# Patient Record
Sex: Female | Born: 1970 | Race: White | Hispanic: No | Smoking: Never smoker
Health system: Southern US, Community
[De-identification: ages and names within clinical notes are randomized; demographics above are authoritative.]

## PROBLEM LIST (undated history)

## (undated) ENCOUNTER — Inpatient Hospital Stay (HOSPITAL_COMMUNITY): Payer: Self-pay

## (undated) DIAGNOSIS — Z87440 Personal history of urinary (tract) infections: Secondary | ICD-10-CM

## (undated) DIAGNOSIS — F988 Other specified behavioral and emotional disorders with onset usually occurring in childhood and adolescence: Secondary | ICD-10-CM

## (undated) DIAGNOSIS — R51 Headache: Secondary | ICD-10-CM

## (undated) DIAGNOSIS — R5383 Other fatigue: Secondary | ICD-10-CM

## (undated) DIAGNOSIS — Z8719 Personal history of other diseases of the digestive system: Secondary | ICD-10-CM

## (undated) DIAGNOSIS — Z8489 Family history of other specified conditions: Secondary | ICD-10-CM

## (undated) DIAGNOSIS — N189 Chronic kidney disease, unspecified: Secondary | ICD-10-CM

## (undated) DIAGNOSIS — E039 Hypothyroidism, unspecified: Secondary | ICD-10-CM

## (undated) DIAGNOSIS — R5381 Other malaise: Secondary | ICD-10-CM

## (undated) DIAGNOSIS — C801 Malignant (primary) neoplasm, unspecified: Secondary | ICD-10-CM

## (undated) DIAGNOSIS — G43009 Migraine without aura, not intractable, without status migrainosus: Secondary | ICD-10-CM

## (undated) DIAGNOSIS — Z87442 Personal history of urinary calculi: Secondary | ICD-10-CM

## (undated) DIAGNOSIS — F329 Major depressive disorder, single episode, unspecified: Secondary | ICD-10-CM

## (undated) DIAGNOSIS — F411 Generalized anxiety disorder: Secondary | ICD-10-CM

## (undated) DIAGNOSIS — G4730 Sleep apnea, unspecified: Secondary | ICD-10-CM

## (undated) HISTORY — DX: Personal history of urinary (tract) infections: Z87.440

## (undated) HISTORY — DX: Other fatigue: R53.83

## (undated) HISTORY — DX: Hypothyroidism, unspecified: E03.9

## (undated) HISTORY — DX: Chronic kidney disease, unspecified: N18.9

## (undated) HISTORY — DX: Other malaise: R53.81

## (undated) HISTORY — DX: Other specified behavioral and emotional disorders with onset usually occurring in childhood and adolescence: F98.8

## (undated) HISTORY — DX: Migraine without aura, not intractable, without status migrainosus: G43.009

## (undated) HISTORY — DX: Major depressive disorder, single episode, unspecified: F32.9

## (undated) HISTORY — DX: Generalized anxiety disorder: F41.1

## (undated) HISTORY — DX: Malignant (primary) neoplasm, unspecified: C80.1

## (undated) HISTORY — DX: Sleep apnea, unspecified: G47.30

## (undated) HISTORY — PX: KIDNEY STONE SURGERY: SHX686

## (undated) HISTORY — DX: Personal history of other diseases of the digestive system: Z87.19

## (undated) HISTORY — DX: Personal history of urinary calculi: Z87.442

## (undated) HISTORY — DX: Headache: R51

---

## 1997-10-30 ENCOUNTER — Encounter (HOSPITAL_COMMUNITY): Admission: RE | Admit: 1997-10-30 | Discharge: 1998-01-28 | Payer: Self-pay | Admitting: *Deleted

## 1998-03-02 ENCOUNTER — Other Ambulatory Visit: Admission: RE | Admit: 1998-03-02 | Discharge: 1998-03-02 | Payer: Self-pay | Admitting: Obstetrics & Gynecology

## 1998-03-06 ENCOUNTER — Encounter (HOSPITAL_COMMUNITY): Admission: RE | Admit: 1998-03-06 | Discharge: 1998-06-04 | Payer: Self-pay | Admitting: *Deleted

## 1998-06-28 ENCOUNTER — Encounter (HOSPITAL_COMMUNITY): Admission: RE | Admit: 1998-06-28 | Discharge: 1998-09-26 | Payer: Self-pay | Admitting: *Deleted

## 1998-10-03 ENCOUNTER — Encounter (HOSPITAL_COMMUNITY): Admission: RE | Admit: 1998-10-03 | Discharge: 1999-01-01 | Payer: Self-pay | Admitting: *Deleted

## 1998-12-02 ENCOUNTER — Encounter (HOSPITAL_COMMUNITY): Admission: RE | Admit: 1998-12-02 | Discharge: 1999-03-02 | Payer: Self-pay | Admitting: *Deleted

## 1999-03-11 ENCOUNTER — Encounter (HOSPITAL_COMMUNITY): Admission: RE | Admit: 1999-03-11 | Discharge: 1999-06-09 | Payer: Self-pay | Admitting: *Deleted

## 1999-04-11 ENCOUNTER — Encounter: Admission: RE | Admit: 1999-04-11 | Discharge: 1999-04-11 | Payer: Self-pay | Admitting: Hematology and Oncology

## 2000-08-07 ENCOUNTER — Emergency Department (HOSPITAL_COMMUNITY): Admission: EM | Admit: 2000-08-07 | Discharge: 2000-08-07 | Payer: Self-pay

## 2003-01-05 ENCOUNTER — Other Ambulatory Visit: Admission: RE | Admit: 2003-01-05 | Discharge: 2003-01-05 | Payer: Self-pay | Admitting: Obstetrics and Gynecology

## 2005-10-31 ENCOUNTER — Other Ambulatory Visit: Admission: RE | Admit: 2005-10-31 | Discharge: 2005-10-31 | Payer: Self-pay | Admitting: Obstetrics and Gynecology

## 2007-06-03 ENCOUNTER — Emergency Department (HOSPITAL_COMMUNITY): Admission: EM | Admit: 2007-06-03 | Discharge: 2007-06-03 | Payer: Self-pay | Admitting: Emergency Medicine

## 2007-06-24 ENCOUNTER — Ambulatory Visit: Payer: Self-pay | Admitting: Family Medicine

## 2007-06-24 DIAGNOSIS — Z87440 Personal history of urinary (tract) infections: Secondary | ICD-10-CM | POA: Insufficient documentation

## 2007-06-24 DIAGNOSIS — Z87442 Personal history of urinary calculi: Secondary | ICD-10-CM | POA: Insufficient documentation

## 2007-06-24 DIAGNOSIS — G43009 Migraine without aura, not intractable, without status migrainosus: Secondary | ICD-10-CM | POA: Insufficient documentation

## 2007-06-24 DIAGNOSIS — F329 Major depressive disorder, single episode, unspecified: Secondary | ICD-10-CM

## 2007-06-24 DIAGNOSIS — F339 Major depressive disorder, recurrent, unspecified: Secondary | ICD-10-CM | POA: Insufficient documentation

## 2007-06-24 DIAGNOSIS — F411 Generalized anxiety disorder: Secondary | ICD-10-CM | POA: Insufficient documentation

## 2007-06-24 DIAGNOSIS — R5383 Other fatigue: Secondary | ICD-10-CM

## 2007-06-24 DIAGNOSIS — F3289 Other specified depressive episodes: Secondary | ICD-10-CM

## 2007-06-24 DIAGNOSIS — R5381 Other malaise: Secondary | ICD-10-CM

## 2007-06-24 DIAGNOSIS — Z8719 Personal history of other diseases of the digestive system: Secondary | ICD-10-CM | POA: Insufficient documentation

## 2007-06-24 HISTORY — DX: Personal history of urinary calculi: Z87.442

## 2007-06-24 HISTORY — DX: Other malaise: R53.81

## 2007-06-24 HISTORY — DX: Generalized anxiety disorder: F41.1

## 2007-06-24 HISTORY — DX: Personal history of other diseases of the digestive system: Z87.19

## 2007-06-24 HISTORY — DX: Other fatigue: R53.83

## 2007-06-24 HISTORY — DX: Major depressive disorder, single episode, unspecified: F32.9

## 2007-06-24 HISTORY — DX: Other specified depressive episodes: F32.89

## 2007-06-24 HISTORY — DX: Personal history of urinary (tract) infections: Z87.440

## 2007-06-24 HISTORY — DX: Migraine without aura, not intractable, without status migrainosus: G43.009

## 2007-06-25 ENCOUNTER — Encounter: Payer: Self-pay | Admitting: Family Medicine

## 2007-06-29 ENCOUNTER — Telehealth (INDEPENDENT_AMBULATORY_CARE_PROVIDER_SITE_OTHER): Payer: Self-pay | Admitting: *Deleted

## 2007-06-29 ENCOUNTER — Encounter (INDEPENDENT_AMBULATORY_CARE_PROVIDER_SITE_OTHER): Payer: Self-pay | Admitting: *Deleted

## 2007-06-29 LAB — CONVERTED CEMR LAB
ALT: 18 units/L (ref 0–35)
AST: 18 units/L (ref 0–37)
Albumin: 3.8 g/dL (ref 3.5–5.2)
Alkaline Phosphatase: 60 units/L (ref 39–117)
Basophils Absolute: 0.2 10*3/uL — ABNORMAL HIGH (ref 0.0–0.1)
Bilirubin, Direct: 0.1 mg/dL (ref 0.0–0.3)
Eosinophils Absolute: 0.1 10*3/uL (ref 0.0–0.6)
Eosinophils Relative: 1.5 % (ref 0.0–5.0)
GFR calc Af Amer: 81 mL/min
HCT: 38 % (ref 36.0–46.0)
MCHC: 34.4 g/dL (ref 30.0–36.0)
MCV: 81.3 fL (ref 78.0–100.0)
Neutro Abs: 5.4 10*3/uL (ref 1.4–7.7)
Potassium: 3.9 meq/L (ref 3.5–5.1)
RDW: 14.2 % (ref 11.5–14.6)
Sodium: 142 meq/L (ref 135–145)
Total Bilirubin: 1.1 mg/dL (ref 0.3–1.2)

## 2007-07-14 ENCOUNTER — Ambulatory Visit: Payer: Self-pay | Admitting: Family Medicine

## 2007-07-14 DIAGNOSIS — F988 Other specified behavioral and emotional disorders with onset usually occurring in childhood and adolescence: Secondary | ICD-10-CM | POA: Insufficient documentation

## 2007-07-14 DIAGNOSIS — E039 Hypothyroidism, unspecified: Secondary | ICD-10-CM

## 2007-07-14 HISTORY — DX: Other specified behavioral and emotional disorders with onset usually occurring in childhood and adolescence: F98.8

## 2007-07-14 HISTORY — DX: Hypothyroidism, unspecified: E03.9

## 2007-07-19 LAB — CONVERTED CEMR LAB: TSH: 7.99 microintl units/mL — ABNORMAL HIGH (ref 0.35–5.50)

## 2007-08-11 ENCOUNTER — Ambulatory Visit: Payer: Self-pay | Admitting: Family Medicine

## 2007-09-01 ENCOUNTER — Ambulatory Visit: Payer: Self-pay | Admitting: Family Medicine

## 2007-09-02 ENCOUNTER — Encounter (INDEPENDENT_AMBULATORY_CARE_PROVIDER_SITE_OTHER): Payer: Self-pay | Admitting: *Deleted

## 2007-09-02 LAB — CONVERTED CEMR LAB: TSH: 7.08 microintl units/mL — ABNORMAL HIGH (ref 0.35–5.50)

## 2007-09-10 ENCOUNTER — Telehealth (INDEPENDENT_AMBULATORY_CARE_PROVIDER_SITE_OTHER): Payer: Self-pay | Admitting: *Deleted

## 2007-10-08 ENCOUNTER — Ambulatory Visit: Payer: Self-pay | Admitting: Internal Medicine

## 2007-10-08 DIAGNOSIS — R51 Headache: Secondary | ICD-10-CM

## 2007-10-08 DIAGNOSIS — R519 Headache, unspecified: Secondary | ICD-10-CM | POA: Insufficient documentation

## 2007-10-08 HISTORY — DX: Headache: R51

## 2007-10-08 LAB — CONVERTED CEMR LAB
Cholesterol: 216 mg/dL (ref 0–200)
HDL: 37.8 mg/dL — ABNORMAL LOW (ref >39.0)
LDL Cholesterol: 104 mg/dL — ABNORMAL HIGH (ref 0–99)
TSH: 9.86 microintl units/mL — ABNORMAL HIGH (ref 0.35–5.50)
Total CHOL/HDL Ratio: 5.7
VLDL: 74 mg/dL — ABNORMAL HIGH (ref 0–40)

## 2007-10-20 ENCOUNTER — Encounter: Payer: Self-pay | Admitting: Internal Medicine

## 2007-12-14 ENCOUNTER — Encounter: Payer: Self-pay | Admitting: Internal Medicine

## 2008-01-17 ENCOUNTER — Telehealth: Payer: Self-pay | Admitting: Internal Medicine

## 2008-01-28 ENCOUNTER — Ambulatory Visit: Payer: Self-pay | Admitting: Internal Medicine

## 2008-05-16 ENCOUNTER — Telehealth: Payer: Self-pay | Admitting: Internal Medicine

## 2008-07-26 ENCOUNTER — Emergency Department (HOSPITAL_BASED_OUTPATIENT_CLINIC_OR_DEPARTMENT_OTHER): Admission: EM | Admit: 2008-07-26 | Discharge: 2008-07-26 | Payer: Self-pay | Admitting: Emergency Medicine

## 2008-07-31 ENCOUNTER — Ambulatory Visit: Payer: Self-pay | Admitting: Internal Medicine

## 2008-07-31 DIAGNOSIS — M25519 Pain in unspecified shoulder: Secondary | ICD-10-CM | POA: Insufficient documentation

## 2008-08-28 ENCOUNTER — Telehealth: Payer: Self-pay | Admitting: Internal Medicine

## 2008-11-07 ENCOUNTER — Telehealth: Payer: Self-pay | Admitting: Internal Medicine

## 2009-03-26 ENCOUNTER — Telehealth: Payer: Self-pay | Admitting: Internal Medicine

## 2009-06-27 ENCOUNTER — Telehealth: Payer: Self-pay | Admitting: Internal Medicine

## 2009-09-10 ENCOUNTER — Telehealth: Payer: Self-pay | Admitting: Internal Medicine

## 2010-02-11 ENCOUNTER — Ambulatory Visit: Payer: Self-pay | Admitting: Family Medicine

## 2010-02-11 DIAGNOSIS — N390 Urinary tract infection, site not specified: Secondary | ICD-10-CM | POA: Insufficient documentation

## 2010-02-11 LAB — CONVERTED CEMR LAB
Glucose, Urine, Semiquant: NEGATIVE
Protein, U semiquant: >=300
Specific Gravity, Urine: 1.025

## 2010-10-31 NOTE — Assessment & Plan Note (Signed)
Summary: FEVER, L SIDE PAIN, NAUSEA // RS   Vital Signs:  Patient profile:   40 year old female Weight:      287 pounds Temp:     99.2 degrees F oral BP sitting:   112 / 82  (left arm) Cuff size:   large  Vitals Entered By: Raechel Ache, RN (Feb 11, 2010 11:32 AM) CC: C/o L flank pain rad into leg, dysuria, fever 102 over weekend, nausea. Currently on menses.   History of Present Illness: Here for 3 days of fevers to 102 degrees, nausea (vomited once), urgency to urinate, burning on urination, and left flank pain. She is also on her menses now. She has passed 6 kidney stones in the past, but she does not think this reperesents a stone right now. Drinking plenty of water. On Motrin.   Allergies: 1)  ! Sulfa  Past History:  Past Medical History: Reviewed history from 10/08/2007 and no changes required. Anxiety Depression Nephrolithiasis, hx of UTI Headache Hypothyroidism  Past Surgical History: Reviewed history from 10/08/2007 and no changes required. gravida one, para one, aborta zero  Review of Systems  The patient denies anorexia, weight loss, weight gain, vision loss, decreased hearing, hoarseness, chest pain, syncope, dyspnea on exertion, peripheral edema, prolonged cough, headaches, hemoptysis, melena, hematochezia, severe indigestion/heartburn, hematuria, incontinence, genital sores, muscle weakness, suspicious skin lesions, transient blindness, difficulty walking, depression, unusual weight change, abnormal bleeding, enlarged lymph nodes, angioedema, breast masses, and testicular masses.    Physical Exam  General:  Well-developed,well-nourished,in no acute distress; alert,appropriate and cooperative throughout examination Abdomen:  Bowel sounds positive,abdomen soft and non-tender without masses, organomegaly or hernias noted.   Impression & Recommendations:  Problem # 1:  UTI (ICD-599.0)  Her updated medication list for this problem includes:    Cipro 500  Mg Tabs (Ciprofloxacin hcl) .Marland Kitchen..Marland Kitchen Two times a day  Orders: T-Culture, Urine ([id number]) UA Dipstick w/o Micro (manual) (09811)  Complete Medication List: 1)  Synthroid 75 Mcg Tabs (Levothyroxine sodium) .... Take one tablet daily 2)  Amphetamine-dextroamphetamine 30 Mg Xr24h-cap (Amphetamine-dextroamphetamine) .... One daily 3)  Amphetamine-dextroamphetamine 10 Mg Tabs (Amphetamine-dextroamphetamine) .... One daily in the afternoon, as needed 4)  Cipro 500 Mg Tabs (Ciprofloxacin hcl) .... Two times a day  Patient Instructions: 1)  call Dr. Brunilda Payor soon.  Prescriptions: CIPRO 500 MG TABS (CIPROFLOXACIN HCL) two times a day  #14 x 0   Entered and Authorized by:   Nelwyn Salisbury MD   Signed by:   Nelwyn Salisbury MD on 02/11/2010   Method used:   Electronically to        [facility] [facility] [phone]* (retail)       [address] [place], [place]  [address]       Ph: [phone] or [phone]       Fax: [phone]   RxID:   [id number]   Laboratory Results   Urine Tests    Routine Urinalysis   Color: orange Appearance: Hazy Glucose: negative   (Normal Range: Negative) Bilirubin: small   (Normal Range: Negative) Ketone: negative   (Normal Range: Negative) Spec. Gravity: 1.025   (Normal Range: 1.003-1.035) Blood: large   (Normal Range: Negative) pH: 6.0   (Normal Range: 5.0-8.0) Protein: >=300   (Normal Range: Negative) Urobilinogen: 0.2   (Normal Range: 0-1) Nitrite: positive   (Normal Range: Negative) Leukocyte Esterace: moderate   (Normal Range: Negative)

## 2011-05-21 ENCOUNTER — Other Ambulatory Visit (INDEPENDENT_AMBULATORY_CARE_PROVIDER_SITE_OTHER): Payer: Managed Care, Other (non HMO)

## 2011-05-21 DIAGNOSIS — Z0000 Encounter for general adult medical examination without abnormal findings: Secondary | ICD-10-CM

## 2011-05-21 LAB — POCT URINALYSIS DIPSTICK
Bilirubin, UA: NEGATIVE
Glucose, UA: NEGATIVE
Nitrite, UA: POSITIVE
Spec Grav, UA: 1.025
Urobilinogen, UA: 0.2

## 2011-05-21 LAB — HEPATIC FUNCTION PANEL
AST: 21 U/L (ref 0–37)
Bilirubin, Direct: 0 mg/dL (ref 0.0–0.3)

## 2011-05-21 LAB — BASIC METABOLIC PANEL
BUN: 21 mg/dL (ref 6–23)
Chloride: 108 mEq/L (ref 96–112)
Creatinine, Ser: 0.8 mg/dL (ref 0.4–1.2)
GFR: 79.9 mL/min (ref >60.00)
Potassium: 4.1 mEq/L (ref 3.5–5.1)
Sodium: 139 mEq/L (ref 135–145)

## 2011-05-21 LAB — CBC WITH DIFFERENTIAL/PLATELET
Basophils Relative: 0.7 % (ref 0.0–3.0)
Eosinophils Absolute: 0.1 10*3/uL (ref 0.0–0.7)
Eosinophils Relative: 1.6 % (ref 0.0–5.0)
HCT: 42.7 % (ref 36.0–46.0)
Hemoglobin: 14.2 g/dL (ref 12.0–15.0)
Lymphocytes Relative: 30 % (ref 12.0–46.0)
Lymphs Abs: 2.1 10*3/uL (ref 0.7–4.0)
MCHC: 33.3 g/dL (ref 30.0–36.0)
Monocytes Absolute: 0.4 10*3/uL (ref 0.1–1.0)
Monocytes Relative: 5.6 % (ref 3.0–12.0)
Neutro Abs: 4.3 10*3/uL (ref 1.4–7.7)
Neutrophils Relative %: 62.1 % (ref 43.0–77.0)
Platelets: 227 10*3/uL (ref 150.0–400.0)
RDW: 14.5 % (ref 11.5–14.6)

## 2011-05-21 LAB — LIPID PANEL
Total CHOL/HDL Ratio: 5
Triglycerides: 396 mg/dL — ABNORMAL HIGH (ref 0.0–149.0)
VLDL: 79.2 mg/dL — ABNORMAL HIGH (ref 0.0–40.0)

## 2011-05-21 LAB — LDL CHOLESTEROL, DIRECT: Direct LDL: 136.3 mg/dL

## 2011-05-22 ENCOUNTER — Telehealth: Payer: Self-pay

## 2011-05-22 MED ORDER — LEVOTHYROXINE SODIUM 75 MCG PO TABS: 75.0000 ug | ORAL_TABLET | Freq: Every day | ORAL | Status: DC

## 2011-05-22 NOTE — Telephone Encounter (Signed)
Called pt per dr. Vernon Prey request - TSH elevated - she has been out og med x57mos. [facility] ,get started and will re-test at 9/11 cpx. KIK

## 2011-06-09 ENCOUNTER — Encounter: Payer: Self-pay | Admitting: Internal Medicine

## 2011-06-10 ENCOUNTER — Ambulatory Visit (INDEPENDENT_AMBULATORY_CARE_PROVIDER_SITE_OTHER): Payer: Managed Care, Other (non HMO) | Admitting: Internal Medicine

## 2011-06-10 ENCOUNTER — Encounter: Payer: Self-pay | Admitting: Internal Medicine

## 2011-06-10 DIAGNOSIS — F3289 Other specified depressive episodes: Secondary | ICD-10-CM

## 2011-06-10 DIAGNOSIS — E039 Hypothyroidism, unspecified: Secondary | ICD-10-CM

## 2011-06-10 DIAGNOSIS — Z87442 Personal history of urinary calculi: Secondary | ICD-10-CM

## 2011-06-10 DIAGNOSIS — F988 Other specified behavioral and emotional disorders with onset usually occurring in childhood and adolescence: Secondary | ICD-10-CM

## 2011-06-10 DIAGNOSIS — F329 Major depressive disorder, single episode, unspecified: Secondary | ICD-10-CM

## 2011-06-10 MED ORDER — AMPHETAMINE-DEXTROAMPHETAMINE 10 MG PO TABS: 10.0000 mg | ORAL_TABLET | Freq: Every day | ORAL | Status: DC | PRN

## 2011-06-10 MED ORDER — AMPHETAMINE-DEXTROAMPHETAMINE 20 MG PO TABS: 20.0000 mg | ORAL_TABLET | Freq: Every day | ORAL | Status: DC

## 2011-06-10 MED ORDER — LEVOTHYROXINE SODIUM 100 MCG PO TABS: 100.0000 ug | ORAL_TABLET | Freq: Every day | ORAL | Status: DC

## 2011-06-10 NOTE — Progress Notes (Signed)
  Subjective:    Patient ID: Madison Tapia, female    DOB: 30-May-1971, 40 y.o.   MRN: [id number]  HPI  40 year old patient who is in today for a health maintenance examination. She has hypothyroidism but has not taken her medication in a number of months laboratory studies were reviewed and TSH is elevated. She has ADD but has not taken Adderall in some time. She feels as she did not tolerate well the extended release form. She has nephrolithiasis and did have some recent symptomatic renal colic. She has exogenous obesity other medical problems include anxiety depression history of migraine headaches. She has a history of IBS.    Review of Systems  Constitutional: Positive for fatigue and unexpected weight change. Negative for fever and appetite change.  HENT: Negative for hearing loss, ear pain, nosebleeds, congestion, sore throat, mouth sores, trouble swallowing, neck stiffness, dental problem, voice change, sinus pressure and tinnitus.   Eyes: Negative for photophobia, pain, redness and visual disturbance.  Respiratory: Negative for cough, chest tightness and shortness of breath.   Cardiovascular: Negative for chest pain, palpitations and leg swelling.  Gastrointestinal: Negative for nausea, vomiting, abdominal pain, diarrhea, constipation, blood in stool, abdominal distention and rectal pain.  Genitourinary: Negative for dysuria, urgency, frequency, hematuria, flank pain, vaginal bleeding, vaginal discharge, difficulty urinating, genital sores, vaginal pain, menstrual problem and pelvic pain.  Musculoskeletal: Negative for back pain and arthralgias.  Skin: Negative for rash.  Neurological: Positive for weakness. Negative for dizziness, syncope, speech difficulty, light-headedness, numbness and headaches.  Hematological: Negative for adenopathy. Does not bruise/bleed easily.  Psychiatric/Behavioral: Negative for suicidal ideas, behavioral problems, self-injury, dysphoric mood and  agitation. The patient is not nervous/anxious.        Objective:   Physical Exam  Constitutional: She is oriented to person, place, and time. She appears well-developed and well-nourished.  HENT:  Head: Normocephalic and atraumatic.  Right Ear: External ear normal.  Left Ear: External ear normal.  Mouth/Throat: Oropharynx is clear and moist.  Eyes: Conjunctivae and EOM are normal.  Neck: Normal range of motion. Neck supple. No JVD present. No thyromegaly present.  Cardiovascular: Normal rate, regular rhythm, normal heart sounds and intact distal pulses.   No murmur heard. Pulmonary/Chest: Effort normal and breath sounds normal. She has no wheezes. She has no rales.  Abdominal: Soft. Bowel sounds are normal. She exhibits no distension and no mass. There is no tenderness. There is no rebound and no guarding.  Genitourinary:       Deferred due to active menses  Musculoskeletal: Normal range of motion. She exhibits no edema and no tenderness.  Neurological: She is alert and oriented to person, place, and time. She has normal reflexes. No cranial nerve deficit. She exhibits normal muscle tone. Coordination normal.       Reflexes appear to be normal  Skin: Skin is warm and dry. No rash noted.       Skin slightly dry  Psychiatric: She has a normal mood and affect. Her behavior is normal.          Assessment & Plan:   Preventive health Morbid obesity Hypothyroidism ADD Mild dyslipidemia aggravated by hypothyroidism  The patient will be resumed on her medications she has had to return in 6 weeks for a followup TSH. We'll consider a Pap at that time

## 2011-06-10 NOTE — Patient Instructions (Addendum)
Limit your sodium (Salt) intake    It is important that you exercise regularly, at least 20 minutes 3 to 4 times per week.  If you develop chest pain or shortness of breath seek  medical attention.  Followup thyroid test in 6-8 weeks   Schedule your mammogram.

## 2011-07-11 LAB — URINE MICROSCOPIC-ADD ON

## 2011-07-11 LAB — URINE CULTURE

## 2011-07-11 LAB — URINALYSIS, ROUTINE W REFLEX MICROSCOPIC
Bilirubin Urine: NEGATIVE
Nitrite: NEGATIVE
Protein, ur: NEGATIVE
Specific Gravity, Urine: 1.013

## 2011-07-11 LAB — CBC
HCT: 34.7 — ABNORMAL LOW
Hemoglobin: 12.1
MCHC: 34.9
MCV: 81.2
Platelets: 295
RBC: 4.28
RDW: 13.3

## 2011-07-11 LAB — COMPREHENSIVE METABOLIC PANEL
AST: 22
BUN: 12
CO2: 25
Chloride: 103
GFR calc non Af Amer: 47 — ABNORMAL LOW
Potassium: 4.2
Sodium: 136

## 2011-07-11 LAB — POCT PREGNANCY, URINE
Operator id: 197071
Preg Test, Ur: NEGATIVE

## 2011-07-11 LAB — DIFFERENTIAL
Lymphocytes Relative: 15
Lymphs Abs: 2
Monocytes Absolute: 0.9 — ABNORMAL HIGH

## 2011-07-22 ENCOUNTER — Ambulatory Visit: Payer: Managed Care, Other (non HMO) | Admitting: Internal Medicine

## 2011-09-15 ENCOUNTER — Other Ambulatory Visit: Payer: Self-pay | Admitting: Internal Medicine

## 2011-09-15 MED ORDER — AMPHETAMINE-DEXTROAMPHETAMINE 20 MG PO TABS: 20.0000 mg | ORAL_TABLET | Freq: Every day | ORAL | Status: DC

## 2011-09-15 MED ORDER — AMPHETAMINE-DEXTROAMPHETAMINE 10 MG PO TABS: 10.0000 mg | ORAL_TABLET | Freq: Every day | ORAL | Status: DC | PRN

## 2011-09-15 NOTE — Telephone Encounter (Signed)
Ans mach - LMTCB if questions - rx's ready to pick up

## 2011-09-15 NOTE — Telephone Encounter (Signed)
Refill Adderall. Thanks. °

## 2012-01-26 ENCOUNTER — Other Ambulatory Visit: Payer: Self-pay | Admitting: Internal Medicine

## 2012-01-26 MED ORDER — AMPHETAMINE-DEXTROAMPHETAMINE 10 MG PO TABS: 10.0000 mg | ORAL_TABLET | Freq: Every day | ORAL | Status: DC | PRN

## 2012-01-26 MED ORDER — AMPHETAMINE-DEXTROAMPHETAMINE 20 MG PO TABS: 20.0000 mg | ORAL_TABLET | Freq: Every day | ORAL | Status: DC

## 2012-01-26 NOTE — Telephone Encounter (Signed)
Pt called req scripts for Adderall 10 mg and Adderall 20 mg

## 2012-01-26 NOTE — Telephone Encounter (Signed)
Pt aware rx's ready for pick up  

## 2012-06-10 ENCOUNTER — Telehealth: Payer: Self-pay | Admitting: Internal Medicine

## 2012-06-10 MED ORDER — AMPHETAMINE-DEXTROAMPHETAMINE 20 MG PO TABS: 20.0000 mg | ORAL_TABLET | Freq: Every day | ORAL | Status: DC

## 2012-06-10 MED ORDER — AMPHETAMINE-DEXTROAMPHETAMINE 10 MG PO TABS: 10.0000 mg | ORAL_TABLET | Freq: Every day | ORAL | Status: DC | PRN

## 2012-06-10 NOTE — Telephone Encounter (Signed)
attmept to call - Vm - LMTCB if questions - rx's ready for pick up

## 2012-06-10 NOTE — Telephone Encounter (Signed)
ok 

## 2012-06-10 NOTE — Telephone Encounter (Signed)
Patient called stating that she need a refill of her adderall. Please assist. Patient would like to have her 20mg  and her 10mg  printed on different sheets as she does not fill them at the same time as she only takes the 10mg  prn. Please assist.

## 2012-06-10 NOTE — Telephone Encounter (Signed)
Please advise - just seen cpx - yesterday

## 2012-07-06 ENCOUNTER — Other Ambulatory Visit (INDEPENDENT_AMBULATORY_CARE_PROVIDER_SITE_OTHER): Payer: Managed Care, Other (non HMO)

## 2012-07-06 ENCOUNTER — Other Ambulatory Visit: Payer: Self-pay | Admitting: Internal Medicine

## 2012-07-06 DIAGNOSIS — Z0000 Encounter for general adult medical examination without abnormal findings: Secondary | ICD-10-CM

## 2012-07-06 LAB — BASIC METABOLIC PANEL
BUN: 12 mg/dL (ref 6–23)
CO2: 22 mEq/L (ref 19–32)
Chloride: 106 mEq/L (ref 96–112)
Creatinine, Ser: 0.9 mg/dL (ref 0.4–1.2)
Potassium: 4.2 mEq/L (ref 3.5–5.1)

## 2012-07-06 LAB — POCT URINALYSIS DIPSTICK
Bilirubin, UA: NEGATIVE
Glucose, UA: NEGATIVE
Nitrite, UA: POSITIVE
Urobilinogen, UA: 0.2
pH, UA: 5.5

## 2012-07-06 LAB — CBC WITH DIFFERENTIAL/PLATELET
Basophils Absolute: 0.1 10*3/uL (ref 0.0–0.1)
Eosinophils Absolute: 0.1 10*3/uL (ref 0.0–0.7)
HCT: 40.7 % (ref 36.0–46.0)
Lymphs Abs: 2.3 10*3/uL (ref 0.7–4.0)
MCHC: 33.1 g/dL (ref 30.0–36.0)
MCV: 85 fl (ref 78.0–100.0)
Monocytes Absolute: 0.5 10*3/uL (ref 0.1–1.0)
Neutrophils Relative %: 64.6 % (ref 43.0–77.0)
Platelets: 243 10*3/uL (ref 150.0–400.0)
RDW: 14.7 % — ABNORMAL HIGH (ref 11.5–14.6)

## 2012-07-06 LAB — HEPATIC FUNCTION PANEL
Albumin: 3.6 g/dL (ref 3.5–5.2)
Alkaline Phosphatase: 56 U/L (ref 39–117)
Bilirubin, Direct: 0.1 mg/dL (ref 0.0–0.3)
Total Protein: 7.3 g/dL (ref 6.0–8.3)

## 2012-07-06 LAB — LIPID PANEL
Total CHOL/HDL Ratio: 4
VLDL: 37.8 mg/dL (ref 0.0–40.0)

## 2012-07-06 LAB — LDL CHOLESTEROL, DIRECT: Direct LDL: 147 mg/dL

## 2012-07-06 MED ORDER — LEVOTHYROXINE SODIUM 100 MCG PO TABS: 100.0000 ug | ORAL_TABLET | Freq: Every day | ORAL | Status: DC

## 2012-07-06 NOTE — Telephone Encounter (Signed)
We have not recv'd any fax or escribe request for refills - will sent 1 mos Rf to [facility] pending labs drawn today - there may be changes if she has not take in 1 over 1 mos

## 2012-07-06 NOTE — Telephone Encounter (Signed)
Patient came in stating that she has been out of her levothyroxine over a month and she has an appt scheduled for next week and would like to have a refill call into [facility] on [address]. Please assist.

## 2012-07-12 ENCOUNTER — Ambulatory Visit (INDEPENDENT_AMBULATORY_CARE_PROVIDER_SITE_OTHER): Payer: Managed Care, Other (non HMO) | Admitting: Internal Medicine

## 2012-07-12 ENCOUNTER — Other Ambulatory Visit (HOSPITAL_COMMUNITY)
Admission: RE | Admit: 2012-07-12 | Discharge: 2012-07-12 | Disposition: A | Discharge status: Home / Self Care | Payer: Managed Care, Other (non HMO) | Source: Ambulatory Visit | Attending: Internal Medicine | Admitting: Internal Medicine

## 2012-07-12 ENCOUNTER — Encounter: Payer: Self-pay | Admitting: Internal Medicine

## 2012-07-12 VITALS — BP 100/80 | HR 72 | Temp 98.0°F | Resp 18 | Ht 66.0 in | Wt 277.0 lb

## 2012-07-12 DIAGNOSIS — Z0000 Encounter for general adult medical examination without abnormal findings: Secondary | ICD-10-CM

## 2012-07-12 DIAGNOSIS — E039 Hypothyroidism, unspecified: Secondary | ICD-10-CM

## 2012-07-12 DIAGNOSIS — Z87442 Personal history of urinary calculi: Secondary | ICD-10-CM

## 2012-07-12 DIAGNOSIS — Z01419 Encounter for gynecological examination (general) (routine) without abnormal findings: Secondary | ICD-10-CM | POA: Insufficient documentation

## 2012-07-12 MED ORDER — AMPHETAMINE-DEXTROAMPHETAMINE 20 MG PO TABS: 20.0000 mg | ORAL_TABLET | Freq: Every day | ORAL | Status: DC

## 2012-07-12 MED ORDER — AMPHETAMINE-DEXTROAMPHETAMINE 10 MG PO TABS: 10.0000 mg | ORAL_TABLET | Freq: Every day | ORAL | Status: DC | PRN

## 2012-07-12 MED ORDER — LEVOTHYROXINE SODIUM 125 MCG PO TABS: 125.0000 ug | ORAL_TABLET | Freq: Every day | ORAL | Status: DC

## 2012-07-12 NOTE — Progress Notes (Signed)
Subjective:    Patient ID: Madison Tapia, female    DOB: Jun 03, 1971, 41 y.o.   MRN: [id number]  HPI  Wt Readings from Last 3 Encounters:  07/12/12 277 lb (125.646 kg)  06/10/11 298 lb (135.172 kg)  02/11/10 287 lb (130.182 kg)    Review of Systems  Constitutional: Negative for fever, appetite change, fatigue and unexpected weight change.  HENT: Negative for hearing loss, ear pain, nosebleeds, congestion, sore throat, mouth sores, trouble swallowing, neck stiffness, dental problem, voice change, sinus pressure and tinnitus.   Eyes: Negative for photophobia, pain, redness and visual disturbance.  Respiratory: Negative for cough, chest tightness and shortness of breath.   Cardiovascular: Negative for chest pain, palpitations and leg swelling.  Gastrointestinal: Negative for nausea, vomiting, abdominal pain, diarrhea, constipation, blood in stool, abdominal distention and rectal pain.  Genitourinary: Negative for dysuria, urgency, frequency, hematuria, flank pain, vaginal bleeding, vaginal discharge, difficulty urinating, genital sores, vaginal pain, menstrual problem and pelvic pain.  Musculoskeletal: Negative for back pain and arthralgias.  Skin: Negative for rash.  Neurological: Negative for dizziness, syncope, speech difficulty, weakness, light-headedness, numbness and headaches.  Hematological: Negative for adenopathy. Does not bruise/bleed easily.  Psychiatric/Behavioral: Negative for suicidal ideas, behavioral problems, self-injury, dysphoric mood and agitation. The patient is not nervous/anxious.        Objective:   Physical Exam  Constitutional: She is oriented to person, place, and time. She appears well-developed and well-nourished.       Weight 277 Blood pressure well controlled  HENT:  Head: Normocephalic and atraumatic.  Right Ear: External ear normal.  Left Ear: External ear normal.  Mouth/Throat: Oropharynx is clear and moist.  Eyes: Conjunctivae normal and  EOM are normal.  Neck: Normal range of motion. Neck supple. No JVD present. No thyromegaly present.  Cardiovascular: Normal rate, regular rhythm, normal heart sounds and intact distal pulses.   No murmur heard. Pulmonary/Chest: Effort normal and breath sounds normal. She has no wheezes. She has no rales.  Abdominal: Soft. Bowel sounds are normal. She exhibits no distension and no mass. There is no tenderness. There is no rebound and no guarding.  Genitourinary: Vagina normal and uterus normal. No vaginal discharge found.  Musculoskeletal: Normal range of motion. She exhibits no edema and no tenderness.  Neurological: She is alert and oriented to person, place, and time. She has normal reflexes. No cranial nerve deficit. She exhibits normal muscle tone. Coordination normal.  Skin: Skin is warm and dry. No rash noted.  Psychiatric: She has a normal mood and affect. Her behavior is normal.          Assessment & Plan:   Subjective:    Patient ID: Madison Tapia, female    DOB: 03-30-1971, 41 y.o.   MRN: [id number]  HPI  41 year old patient who is in today for a health maintenance examination. She has hypothyroidism but has not taken her medication several days prior to laboratory studies; TSH is elevated. She has ADD but has not taken Adderall in some time. She feels as she did not tolerate well the extended release form. She has nephrolithiasis and did have some recent symptomatic renal colic. She has exogenous obesity other medical problems include anxiety depression history of migraine headaches. She has a history of IBS. Over the past year her  weight is down approximately 22 pounds  The patient is a gravida 1 para 1 abortus 61 with a 37 year old son Family history father is 33 history of diabetes hypertension hypercholesterolemia. Mother  history of hypertension and dyslipidemia also history of breast cancer 2 younger sisters are well  Past Medical History  Diagnosis Date  . ADD  07/14/2007  . ANXIETY 06/24/2007  . COMMON MIGRAINE 06/24/2007  . DEPRESSION 06/24/2007  . Headache 10/08/2007  . HX, URINARY INFECTION 06/24/2007  . IRRITABLE BOWEL SYNDROME, HX OF 06/24/2007  . MALAISE AND FATIGUE 06/24/2007  . 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 Unspecified hypothyroidism 07/14/2007    History   Social History  . Marital Status: Married    Spouse Name: N/A    Number of Children: N/A  . Years of Education: N/A   Occupational History  . Not on file.   Social History Main Topics  . Smoking status: Never Smoker   . Smokeless tobacco: Never Used  . Alcohol Use: Yes  . Drug Use: No  . Sexually Active: Not on file   Other Topics Concern  . Not on file   Social History Narrative  . No narrative on file    No past surgical history on file.  Family History  Problem Relation Age of Onset  . Cancer Mother   . Thyroid disease Mother   . Diabetes Father   . Hypertension Father   . Mental illness Father   . Depression Neg Hx     family hx    Allergies  Allergen Reactions  . Sulfonamide Derivatives     Current Outpatient Prescriptions on File Prior to Visit  Medication Sig Dispense Refill  . DISCONTD: amphetamine-dextroamphetamine (ADDERALL) 10 MG tablet Take 1 tablet (10 mg total) by mouth daily as needed. In the afternoon if needed  30 tablet  0  . DISCONTD: amphetamine-dextroamphetamine (ADDERALL) 20 MG tablet Take 1 tablet (20 mg total) by mouth daily.  30 tablet  0    BP 100/80  Pulse 72  Temp 98 F (36.7 C) (Oral)  Resp 18  Ht 5\' 6"  (1.676 m)  Wt 277 lb (125.646 kg)  BMI 44.71 kg/m2  SpO2 96%        Review of Systems  Constitutional: Positive for fatigue and unexpected weight change. Negative for fever and appetite change.  HENT: Negative for hearing loss, ear pain, nosebleeds, congestion, sore throat, mouth sores, trouble swallowing, neck stiffness, dental problem, voice change, sinus pressure and tinnitus.   Eyes: Negative for  photophobia, pain, redness and visual disturbance.  Respiratory: Negative for cough, chest tightness and shortness of breath.   Cardiovascular: Negative for chest pain, palpitations and leg swelling.  Gastrointestinal: Negative for nausea, vomiting, abdominal pain, diarrhea, constipation, blood in stool, abdominal distention and rectal pain.  Genitourinary: Negative for dysuria, urgency, frequency, hematuria, flank pain, vaginal bleeding, vaginal discharge, difficulty urinating, genital sores, vaginal pain, menstrual problem and pelvic pain.  Musculoskeletal: Negative for back pain and arthralgias.  Skin: Negative for rash.  Neurological: Positive for weakness. Negative for dizziness, syncope, speech difficulty, light-headedness, numbness and headaches.  Hematological: Negative for adenopathy. Does not bruise/bleed easily.  Psychiatric/Behavioral: Negative for suicidal ideas, behavioral problems, self-injury, dysphoric mood and agitation. The patient is not nervous/anxious.        Objective:   Physical Exam  Constitutional: She is oriented to person, place, and time. She appears well-developed and well-nourished.  HENT:  Head: Normocephalic and atraumatic.  Right Ear: External ear normal.  Left Ear: External ear normal.  Mouth/Throat: Oropharynx is clear and moist.  Eyes: Conjunctivae and EOM are normal.  Neck: Normal range of motion. Neck supple. No JVD present. No  thyromegaly present.  Cardiovascular: Normal rate, regular rhythm, normal heart sounds and intact distal pulses.   No murmur heard. Pulmonary/Chest: Effort normal and breath sounds normal. She has no wheezes. She has no rales.  Abdominal: Soft. Bowel sounds are normal. She exhibits no distension and no mass. There is no tenderness. There is no rebound and no guarding.  Genitourinary:       Deferred due to active menses  Musculoskeletal: Normal range of motion. She exhibits no edema and no tenderness.  Neurological: She is  alert and oriented to person, place, and time. She has normal reflexes. No cranial nerve deficit. She exhibits normal muscle tone. Coordination normal.       Reflexes appear to be normal  Skin: Skin is warm and dry. No rash noted.       Skin slightly dry  Psychiatric: She has a normal mood and affect. Her behavior is normal.          Assessment & Plan:   Preventive health Morbid obesity Hypothyroidism ADD   The patient will be resumed on her medications she has had to return in 6 weeks for a followup TSH.  Exercise and weight loss encouraged We'll increase levothyroxine to 125 mcg daily

## 2012-07-12 NOTE — Patient Instructions (Addendum)
You need to lose weight.  Consider a lower calorie diet and regular exercise.    It is important that you exercise regularly, at least 20 minutes 3 to 4 times per week.  If you develop chest pain or shortness of breath seek  medical attention.  Return in one year for follow-up  

## 2012-07-20 NOTE — Progress Notes (Signed)
Quick Note:  Attempt to call- VM - LMTCb if questions - lab normal ______

## 2012-12-06 ENCOUNTER — Ambulatory Visit (INDEPENDENT_AMBULATORY_CARE_PROVIDER_SITE_OTHER): Payer: Managed Care, Other (non HMO) | Admitting: Internal Medicine

## 2012-12-06 ENCOUNTER — Encounter: Payer: Self-pay | Admitting: Internal Medicine

## 2012-12-06 VITALS — BP 118/80 | HR 106 | Temp 98.5°F | Resp 20 | Wt 278.0 lb

## 2012-12-06 DIAGNOSIS — F988 Other specified behavioral and emotional disorders with onset usually occurring in childhood and adolescence: Secondary | ICD-10-CM

## 2012-12-06 DIAGNOSIS — J069 Acute upper respiratory infection, unspecified: Secondary | ICD-10-CM

## 2012-12-06 MED ORDER — HYDROCODONE-HOMATROPINE 5-1.5 MG/5ML PO SYRP: 5.0000 mL | ORAL_SOLUTION | Freq: Four times a day (QID) | ORAL | Status: AC | PRN

## 2012-12-06 MED ORDER — NEOMYCIN-POLYMYXIN-HC 3.5-10000-1 OP SUSP: 2.0000 [drp] | Freq: Four times a day (QID) | OPHTHALMIC | Status: DC

## 2012-12-06 NOTE — Progress Notes (Signed)
Subjective:    Patient ID: Madison Tapia, female    DOB: 11-13-1970, 42 y.o.   MRN: [id number]  HPI  42 year old patient who has a history of ADHD. She has treated hypothyroidism. She's had a two-week not febrile illness with sore throat chest congestion and cough. She generally feels unwell. She was seen at the urgent care 7 days ago and was placed on amoxicillin which he took only briefly. She states that she felt it made her feel worse. She also has noticed some conjunctival injection with some dry matted exudate noted in the morning. She does have a sulfa allergy. No fever. She has been using ibuprofen with benefit.  Past Medical History  Diagnosis Date  . ADD 07/14/2007  . ANXIETY 06/24/2007  . COMMON MIGRAINE 06/24/2007  . DEPRESSION 06/24/2007  . Headache 10/08/2007  . HX, URINARY INFECTION 06/24/2007  . IRRITABLE BOWEL SYNDROME, HX OF 06/24/2007  . MALAISE AND FATIGUE 06/24/2007  . 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 Unspecified hypothyroidism 07/14/2007    History   Social History  . Marital Status: Married    Spouse Name: N/A    Number of Children: N/A  . Years of Education: N/A   Occupational History  . Not on file.   Social History Main Topics  . Smoking status: Never Smoker   . Smokeless tobacco: Never Used  . Alcohol Use: Yes  . Drug Use: No  . Sexually Active: Not on file   Other Topics Concern  . Not on file   Social History Narrative  . No narrative on file    History reviewed. No pertinent past surgical history.  Family History  Problem Relation Age of Onset  . Cancer Mother   . Thyroid disease Mother   . Diabetes Father   . Hypertension Father   . Mental illness Father   . Depression Neg Hx     family hx    Allergies  Allergen Reactions  . Sulfonamide Derivatives     Current Outpatient Prescriptions on File Prior to Visit  Medication Sig Dispense Refill  . amphetamine-dextroamphetamine (ADDERALL) 10 MG tablet Take 1 tablet (10 mg  total) by mouth daily as needed. In the afternoon if needed  30 tablet  0  . amphetamine-dextroamphetamine (ADDERALL) 20 MG tablet Take 1 tablet (20 mg total) by mouth daily.  30 tablet  0  . levothyroxine (SYNTHROID, LEVOTHROID) 125 MCG tablet Take 1 tablet (125 mcg total) by mouth daily.  90 tablet  3   No current facility-administered medications on file prior to visit.    BP 118/80  Pulse 106  Temp(Src) 98.5 F (36.9 C) (Oral)  Resp 20  Wt 278 lb (126.1 kg)  BMI 44.89 kg/m2  SpO2 97%  LMP 11/22/2012       Review of Systems  Constitutional: Positive for activity change and fatigue.  HENT: Positive for congestion, sore throat and sinus pressure. Negative for hearing loss, rhinorrhea, dental problem and tinnitus.   Eyes: Positive for redness. Negative for pain, discharge and visual disturbance.  Respiratory: Positive for cough. Negative for shortness of breath.   Cardiovascular: Negative for chest pain, palpitations and leg swelling.  Gastrointestinal: Negative for nausea, vomiting, abdominal pain, diarrhea, constipation, blood in stool and abdominal distention.  Genitourinary: Negative for dysuria, urgency, frequency, hematuria, flank pain, vaginal bleeding, vaginal discharge, difficulty urinating, vaginal pain and pelvic pain.  Musculoskeletal: Negative for joint swelling, arthralgias and gait problem.  Skin: Negative for rash.  Neurological: Positive for  weakness. Negative for dizziness, syncope, speech difficulty, numbness and headaches.  Hematological: Negative for adenopathy.  Psychiatric/Behavioral: Negative for behavioral problems, dysphoric mood and agitation. The patient is not nervous/anxious.        Objective:   Physical Exam  Constitutional: She is oriented to person, place, and time. She appears well-developed and well-nourished.  HENT:  Head: Normocephalic.  Right Ear: External ear normal.  Left Ear: External ear normal.  Oropharynx erythematous  Eyes:  EOM are normal. Pupils are equal, round, and reactive to light.  Conjunctival injection  Neck: Normal range of motion. Neck supple. No thyromegaly present.  Cardiovascular: Normal rate, regular rhythm, normal heart sounds and intact distal pulses.   Pulmonary/Chest: Effort normal and breath sounds normal. No respiratory distress. She has no wheezes. She has no rales.  Abdominal: Soft. Bowel sounds are normal. She exhibits no mass. There is no tenderness.  Musculoskeletal: Normal range of motion.  Lymphadenopathy:    She has no cervical adenopathy.  Neurological: She is alert and oriented to person, place, and time.  Skin: Skin is warm and dry. No rash noted.  Psychiatric: She has a normal mood and affect. Her behavior is normal.          Assessment & Plan:   Viral URI with cough Conjunctivitis ADHD  We'll treat symptomatically

## 2012-12-06 NOTE — Patient Instructions (Signed)
Use saline irrigation, warm  moist compresses and over-the-counter decongestants only as directed.  Call if there is no improvement in 5 to 7 days, or sooner if you develop increasing pain, fever, or any new symptoms. 

## 2012-12-23 ENCOUNTER — Telehealth: Payer: Self-pay | Admitting: Internal Medicine

## 2012-12-23 MED ORDER — AMPHETAMINE-DEXTROAMPHETAMINE 20 MG PO TABS: 20.0000 mg | ORAL_TABLET | Freq: Every day | ORAL | Status: DC

## 2012-12-23 MED ORDER — AMPHETAMINE-DEXTROAMPHETAMINE 10 MG PO TABS: 10.0000 mg | ORAL_TABLET | Freq: Every day | ORAL | Status: DC | PRN

## 2012-12-23 NOTE — Telephone Encounter (Signed)
Pt needs new rxs generic adderall 10 mg and 20 mg

## 2012-12-23 NOTE — Telephone Encounter (Signed)
Spoke to pt is taking Adderall 10 mg gets three Rx's of each at a time. Told her okay Rx's will be ready for pick up tomorrow. Pt verbalized understanding. Rx's printed and signed by [facility] and put at front desk for pick up.

## 2012-12-23 NOTE — Telephone Encounter (Signed)
Left message on voicemail to call office need to know if taking Adderall 10 mg all the time?

## 2013-01-10 ENCOUNTER — Encounter: Payer: Self-pay | Admitting: Family Medicine

## 2013-01-10 ENCOUNTER — Ambulatory Visit (INDEPENDENT_AMBULATORY_CARE_PROVIDER_SITE_OTHER): Payer: Managed Care, Other (non HMO) | Admitting: Family Medicine

## 2013-01-10 VITALS — BP 120/90 | Temp 99.2°F | Wt 271.0 lb

## 2013-01-10 DIAGNOSIS — R197 Diarrhea, unspecified: Secondary | ICD-10-CM

## 2013-01-10 NOTE — Patient Instructions (Addendum)
Diarrhea Infections caused by germs (bacterial) or a virus commonly cause diarrhea. Your caregiver has determined that with time, rest and fluids, the diarrhea should improve. In general, eat normally while drinking more water than usual. Although water may prevent dehydration, it does not contain salt and minerals (electrolytes). Broths, weak tea without caffeine and oral rehydration solutions (ORS) replace fluids and electrolytes. Small amounts of fluids should be taken frequently. Large amounts at one time may not be tolerated. Plain water may be harmful in infants and the elderly. Oral rehydrating solutions (ORS) are available at pharmacies and grocery stores. ORS replace water and important electrolytes in proper proportions. Sports drinks are not as effective as ORS and may be harmful due to sugars worsening diarrhea.  ORS is especially recommended for use in children with diarrhea. As a general guideline for children, replace any new fluid losses from diarrhea and/or vomiting with ORS as follows:  If your child weighs 22 pounds or under (10 kg or less), give 60-120 mL ( -  cup or 2 - 4 ounces) of ORS for each episode of diarrheal stool or vomiting episode.  If your child weighs more than 22 pounds (more than 10 kgs), give 120-240 mL ( - 1 cup or 4 - 8 ounces) of ORS for each diarrheal stool or episode of vomiting.  While correcting for dehydration, children should eat normally. However, foods high in sugar should be avoided because this may worsen diarrhea. Large amounts of carbonated soft drinks, juice, gelatin desserts and other highly sugared drinks should be avoided.  After correction of dehydration, other liquids that are appealing to the child may be added. Children should drink small amounts of fluids frequently and fluids should be increased as tolerated. Children should drink enough fluids to keep urine clear or pale yellow.  Adults should eat normally while drinking more fluids than  usual. Drink small amounts of fluids frequently and increase as tolerated. Drink enough fluids to keep urine clear or pale yellow. Broths, weak decaffeinated tea, lemon lime soft drinks (allowed to go flat) and ORS replace fluids and electrolytes.  Avoid:  Carbonated drinks.  Juice.  Extremely hot or cold fluids.  Caffeine drinks.  Fatty, greasy foods.  Alcohol.  Tobacco.  Too much intake of anything at one time.  Gelatin desserts.  Probiotics are active cultures of beneficial bacteria. They may lessen the amount and number of diarrheal stools in adults. Probiotics can be found in yogurt with active cultures and in supplements.  Wash hands well to avoid spreading bacteria and virus.  Anti-diarrheal medications are not recommended for infants and children.  Only take over-the-counter or prescription medicines for pain, discomfort or fever as directed by your caregiver. Do not give aspirin to children because it may cause Reye's Syndrome.  For adults, ask your caregiver if you should continue all prescribed and over-the-counter medicines.  If your caregiver has given you a follow-up appointment, it is very important to keep that appointment. Not keeping the appointment could result in a chronic or permanent injury, and disability. If there is any problem keeping the appointment, you must call back to this facility for assistance. SEEK IMMEDIATE MEDICAL CARE IF:   You or your child is unable to keep fluids down or other symptoms or problems become worse in spite of treatment.  Vomiting or diarrhea develops and becomes persistent.  There is vomiting of blood or bile (green material).  There is blood in the stool or the stools are black and   tarry.  There is no urine output in 6-8 hours or there is only a small amount of very dark urine.  Abdominal pain develops, increases or localizes.  You have a fever.  Your baby is older than 3 months with a rectal temperature of 102 F  (38.9 C) or higher.  Your baby is 3 months old or younger with a rectal temperature of 100.4 F (38 C) or higher.  You or your child develops excessive weakness, dizziness, fainting or extreme thirst.  You or your child develops a rash, stiff neck, severe headache or become irritable or sleepy and difficult to awaken. MAKE SURE YOU:   Understand these instructions.  Will watch your condition.  Will get help right away if you are not doing well or get worse. Document Released: 09/05/2002 Document Revised: 12/08/2011 Document Reviewed: 07/23/2009 [employer] Patient Information 2013 [employer].  

## 2013-01-10 NOTE — Progress Notes (Signed)
  Subjective:    Patient ID: Madison Tapia, female    DOB: 05/16/1971, 42 y.o.   MRN: [id number]  HPI  Acute visit Patient seen with 4 day history of watery diarrhea. No bloody diarrhea. Question low-grade fever off and on. She's had some mild nausea but no vomiting. No sick contacts. No recent antibiotics. No recent travels. She's had decreased appetite.  She is keeping down fluids without difficulty. She has not tried Imodium or other medications.  Past Medical History  Diagnosis Date  . ADD 07/14/2007  . ANXIETY 06/24/2007  . COMMON MIGRAINE 06/24/2007  . DEPRESSION 06/24/2007  . Headache 10/08/2007  . HX, URINARY INFECTION 06/24/2007  . IRRITABLE BOWEL SYNDROME, HX OF 06/24/2007  . MALAISE AND FATIGUE 06/24/2007  . 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 Unspecified hypothyroidism 07/14/2007   No past surgical history on file.  reports that she has never smoked. She has never used smokeless tobacco. She reports that  drinks alcohol. She reports that she does not use illicit drugs. family history includes Cancer in her mother; Diabetes in her father; Hypertension in her father; Mental illness in her father; and Thyroid disease in her mother.  There is no history of Depression. Allergies  Allergen Reactions  . Sulfonamide Derivatives      Review of Systems  Constitutional: Negative for fever and chills.  Respiratory: Negative for cough and shortness of breath.   Gastrointestinal: Positive for nausea and diarrhea. Negative for abdominal pain and blood in stool.       Objective:   Physical Exam  Constitutional: She appears well-developed and well-nourished. No distress.  HENT:  Mouth/Throat: Oropharynx is clear and moist.  Neck: Neck supple. No thyromegaly present.  Cardiovascular: Normal rate and regular rhythm.   Pulmonary/Chest: Effort normal and breath sounds normal. No respiratory distress. She has no wheezes. She has no rales.  Abdominal: Soft. Bowel sounds are  normal. She exhibits no distension and no mass. There is no tenderness. There is no rebound and no guarding.          Assessment & Plan:  Diarrhea. Suspect viral. Try over-the-counter Imodium. Advance diet slowly as tolerated. Followup promptly for any fever or new symptoms or symptoms of diarrhea persist in the next week Offered anti nausea meds and she declines.

## 2013-05-09 ENCOUNTER — Telehealth: Payer: Self-pay | Admitting: Internal Medicine

## 2013-05-09 NOTE — Telephone Encounter (Signed)
PT is calling to request a 3 month supply of her amphetamine-dextroamphetamine (ADDERALL) 10 MG tablet, and amphetamine-dextroamphetamine (ADDERALL) 20 MG tablet. Please assist.

## 2013-05-10 MED ORDER — AMPHETAMINE-DEXTROAMPHETAMINE 10 MG PO TABS: 10.0000 mg | ORAL_TABLET | Freq: Every day | ORAL | Status: DC | PRN

## 2013-05-10 MED ORDER — AMPHETAMINE-DEXTROAMPHETAMINE 20 MG PO TABS: 20.0000 mg | ORAL_TABLET | Freq: Every day | ORAL | Status: DC

## 2013-05-10 NOTE — Telephone Encounter (Signed)
Pt notified Rx's ready for pick up. Rx's printed and signed by Dr. Amador Cunas and put at front desk for pick up.

## 2013-07-15 ENCOUNTER — Other Ambulatory Visit: Payer: Self-pay | Admitting: Internal Medicine

## 2013-08-04 ENCOUNTER — Telehealth: Payer: Self-pay | Admitting: Internal Medicine

## 2013-08-04 MED ORDER — LEVOTHYROXINE SODIUM 125 MCG PO TABS: ORAL_TABLET | ORAL | Status: DC

## 2013-08-04 NOTE — Telephone Encounter (Signed)
Pt needs new rx generic adderall 10 mg and generic adderall 20 mg. Pt would like rxs for next 3 months. Please call refill [facility] [address]. Pt has cpx sch for 09-2013

## 2013-08-04 NOTE — Telephone Encounter (Signed)
Left message on voicemail to call office. Rx for Adderall can not be printed tilll 11/12. Rx for Levothyroxine was sent to pharmacy.

## 2013-08-05 NOTE — Telephone Encounter (Signed)
Called and spoke with pt and pt is aware.  Pt knew it was not due until then November 12th.

## 2013-08-10 MED ORDER — AMPHETAMINE-DEXTROAMPHETAMINE 10 MG PO TABS: 10.0000 mg | ORAL_TABLET | Freq: Every day | ORAL | Status: DC | PRN

## 2013-08-10 MED ORDER — AMPHETAMINE-DEXTROAMPHETAMINE 20 MG PO TABS: 20.0000 mg | ORAL_TABLET | Freq: Every day | ORAL | Status: DC

## 2013-08-10 NOTE — Telephone Encounter (Signed)
Rx's printed and signed put at front desk for pickup. 

## 2013-08-10 NOTE — Addendum Note (Signed)
Addended by: Jimmye Norman on: 08/10/2013 08:41 AM   Modules accepted: Orders

## 2013-09-19 ENCOUNTER — Telehealth: Payer: Self-pay | Admitting: Internal Medicine

## 2013-09-19 NOTE — Telephone Encounter (Signed)
Pt needs new rxs for next 3 months generic adderall 10 mg and 20 mg

## 2013-09-19 NOTE — Telephone Encounter (Signed)
Rx ready for pick up and patient is aware 

## 2013-10-07 ENCOUNTER — Other Ambulatory Visit: Payer: Self-pay | Admitting: *Deleted

## 2013-10-07 ENCOUNTER — Other Ambulatory Visit (INDEPENDENT_AMBULATORY_CARE_PROVIDER_SITE_OTHER): Payer: 59

## 2013-10-07 DIAGNOSIS — Z0000 Encounter for general adult medical examination without abnormal findings: Secondary | ICD-10-CM

## 2013-10-07 LAB — LDL CHOLESTEROL, DIRECT: Direct LDL: 143.5 mg/dL

## 2013-10-07 LAB — CBC WITH DIFFERENTIAL/PLATELET
BASOS PCT: 0.7 % (ref 0.0–3.0)
Basophils Absolute: 0.1 10*3/uL (ref 0.0–0.1)
Eosinophils Absolute: 0.1 10*3/uL (ref 0.0–0.7)
Eosinophils Relative: 1.6 % (ref 0.0–5.0)
HCT: 41 % (ref 36.0–46.0)
Hemoglobin: 14 g/dL (ref 12.0–15.0)
Lymphocytes Relative: 27 % (ref 12.0–46.0)
Lymphs Abs: 2.2 10*3/uL (ref 0.7–4.0)
MCHC: 34.1 g/dL (ref 30.0–36.0)
MCV: 81.5 fl (ref 78.0–100.0)
MONO ABS: 0.4 10*3/uL (ref 0.1–1.0)
Monocytes Relative: 5.2 % (ref 3.0–12.0)
NEUTROS PCT: 65.5 % (ref 43.0–77.0)
Neutro Abs: 5.4 10*3/uL (ref 1.4–7.7)
Platelets: 262 10*3/uL (ref 150.0–400.0)
RBC: 5.04 Mil/uL (ref 3.87–5.11)
RDW: 14.9 % — ABNORMAL HIGH (ref 11.5–14.6)
WBC: 8.2 10*3/uL (ref 4.5–10.5)

## 2013-10-07 LAB — POCT URINALYSIS DIPSTICK
Bilirubin, UA: NEGATIVE
Glucose, UA: NEGATIVE
Ketones, UA: NEGATIVE
Nitrite, UA: 0.2
PROTEIN UA: NEGATIVE
Spec Grav, UA: 1.025
UROBILINOGEN UA: 0.2
pH, UA: 5

## 2013-10-07 LAB — LIPID PANEL
Cholesterol: 252 mg/dL — ABNORMAL HIGH (ref 0–200)
HDL: 50 mg/dL (ref >39.00)
Total CHOL/HDL Ratio: 5
Triglycerides: 421 mg/dL — ABNORMAL HIGH (ref 0.0–149.0)
VLDL: 84.2 mg/dL — AB (ref 0.0–40.0)

## 2013-10-07 LAB — BASIC METABOLIC PANEL
BUN: 15 mg/dL (ref 6–23)
CHLORIDE: 107 meq/L (ref 96–112)
CO2: 23 mEq/L (ref 19–32)
CREATININE: 0.9 mg/dL (ref 0.4–1.2)
Calcium: 9.2 mg/dL (ref 8.4–10.5)
GFR: 71.1 mL/min (ref >60.00)
Glucose, Bld: 93 mg/dL (ref 70–99)
POTASSIUM: 3.8 meq/L (ref 3.5–5.1)
Sodium: 138 mEq/L (ref 135–145)

## 2013-10-07 LAB — HEPATIC FUNCTION PANEL
ALK PHOS: 63 U/L (ref 39–117)
ALT: 20 U/L (ref 0–35)
AST: 22 U/L (ref 0–37)
Albumin: 4.1 g/dL (ref 3.5–5.2)
BILIRUBIN DIRECT: 0.1 mg/dL (ref 0.0–0.3)
Total Bilirubin: 0.7 mg/dL (ref 0.3–1.2)
Total Protein: 7.9 g/dL (ref 6.0–8.3)

## 2013-10-07 LAB — TSH: TSH: 10.14 u[IU]/mL — ABNORMAL HIGH (ref 0.35–5.50)

## 2013-10-07 MED ORDER — LEVOTHYROXINE SODIUM 150 MCG PO TABS: 150.0000 ug | ORAL_TABLET | Freq: Every day | ORAL | Status: DC

## 2013-10-14 ENCOUNTER — Encounter: Payer: Managed Care, Other (non HMO) | Admitting: Internal Medicine

## 2013-11-02 ENCOUNTER — Encounter: Payer: Self-pay | Admitting: Internal Medicine

## 2013-11-11 ENCOUNTER — Encounter: Payer: Managed Care, Other (non HMO) | Admitting: Internal Medicine

## 2014-02-08 ENCOUNTER — Telehealth: Payer: Self-pay | Admitting: Internal Medicine

## 2014-02-08 MED ORDER — AMPHETAMINE-DEXTROAMPHETAMINE 20 MG PO TABS: 20.0000 mg | ORAL_TABLET | Freq: Every day | ORAL | Status: DC

## 2014-02-08 MED ORDER — AMPHETAMINE-DEXTROAMPHETAMINE 10 MG PO TABS: 10.0000 mg | ORAL_TABLET | Freq: Every day | ORAL | Status: DC | PRN

## 2014-02-08 NOTE — Telephone Encounter (Signed)
Spoke to pt told her Rx will be ready later today, will give one month supply and when comes in for physical will give rest. Pt verbalized understanding. Rx printed and signed.

## 2014-02-08 NOTE — Telephone Encounter (Signed)
Spoke to pt told her she does not have to repeat labs. Pt verbalized understanding.

## 2014-02-08 NOTE — Telephone Encounter (Signed)
Pt is needing new rx for amphetamine-dextroamphetamine (ADDERALL) 10 MG tablet, amphetamine-dextroamphetamine (adderall) 20 mg, please call when available for pick.

## 2014-02-08 NOTE — Telephone Encounter (Signed)
Pt has rescheduled her appt for her cpe on 02/21/14. Pt would like to know if she needs to redo her labs, she had labs done on 10/14/13.

## 2014-02-21 ENCOUNTER — Other Ambulatory Visit: Payer: Self-pay | Admitting: Internal Medicine

## 2014-02-21 ENCOUNTER — Encounter: Payer: 59 | Admitting: Internal Medicine

## 2014-02-21 NOTE — Telephone Encounter (Signed)
Pt had to reschedule her phy because her cycle started she has been rescheduled  For 04/25/14 she will need a refill on her ADDERRALL 10 & 20 mg  & Synthroid

## 2014-02-23 MED ORDER — AMPHETAMINE-DEXTROAMPHETAMINE 20 MG PO TABS: 20.0000 mg | ORAL_TABLET | Freq: Every day | ORAL | Status: DC

## 2014-02-23 MED ORDER — AMPHETAMINE-DEXTROAMPHETAMINE 10 MG PO TABS: 10.0000 mg | ORAL_TABLET | Freq: Every day | ORAL | Status: DC | PRN

## 2014-02-23 NOTE — Telephone Encounter (Signed)
Pt notified Rx's ready for pickup and that there are refills left on synthroid. Pt verbalized understanding. Rx's printed and signed.

## 2014-04-25 ENCOUNTER — Encounter: Payer: 59 | Admitting: Internal Medicine

## 2014-05-03 ENCOUNTER — Ambulatory Visit (INDEPENDENT_AMBULATORY_CARE_PROVIDER_SITE_OTHER): Payer: 59 | Admitting: Internal Medicine

## 2014-05-03 ENCOUNTER — Encounter: Payer: Self-pay | Admitting: Internal Medicine

## 2014-05-03 VITALS — BP 130/90 | HR 110 | Temp 98.8°F | Resp 20 | Ht 66.5 in | Wt 293.0 lb

## 2014-05-03 DIAGNOSIS — E039 Hypothyroidism, unspecified: Secondary | ICD-10-CM

## 2014-05-03 DIAGNOSIS — F988 Other specified behavioral and emotional disorders with onset usually occurring in childhood and adolescence: Secondary | ICD-10-CM

## 2014-05-03 DIAGNOSIS — Z23 Encounter for immunization: Secondary | ICD-10-CM

## 2014-05-03 DIAGNOSIS — Z0000 Encounter for general adult medical examination without abnormal findings: Secondary | ICD-10-CM

## 2014-05-03 DIAGNOSIS — G43009 Migraine without aura, not intractable, without status migrainosus: Secondary | ICD-10-CM

## 2014-05-03 LAB — TSH: TSH: 6.89 u[IU]/mL — ABNORMAL HIGH (ref 0.35–4.50)

## 2014-05-03 MED ORDER — AMPHETAMINE-DEXTROAMPHETAMINE 20 MG PO TABS: 20.0000 mg | ORAL_TABLET | Freq: Every day | ORAL | Status: DC

## 2014-05-03 MED ORDER — AMPHETAMINE-DEXTROAMPHETAMINE 10 MG PO TABS: 10.0000 mg | ORAL_TABLET | Freq: Every day | ORAL | Status: DC | PRN

## 2014-05-03 NOTE — Progress Notes (Signed)
Pre visit review using our clinic review tool, if applicable. No additional management support is needed unless otherwise documented below in the visit note. 

## 2014-05-03 NOTE — Progress Notes (Signed)
Subjective:    Patient ID: Madison Tapia, female    DOB: Jul 28, 1971, 43 y.o.   MRN: [id number]  HPI  43 year old patient who is in today for a preventive health examination  Medical problems include ADD.  Exogenous obesity, and history of migraine headaches.  She is quite frustrated about inability to lose weight.  She has been followed by a Social worker.  She states that"food is her drug"  Past Medical History  Diagnosis Date  . ADD 07/14/2007  . ANXIETY 06/24/2007  . COMMON MIGRAINE 06/24/2007  . DEPRESSION 06/24/2007  . Headache(784.0) 10/08/2007  . HX, URINARY INFECTION 06/24/2007  . IRRITABLE BOWEL SYNDROME, HX OF 06/24/2007  . MALAISE AND FATIGUE 06/24/2007  . 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 Unspecified hypothyroidism 07/14/2007    History   Social History  . Marital Status: Married    Spouse Name: N/A    Number of Children: N/A  . Years of Education: N/A   Occupational History  . Not on file.   Social History Main Topics  . Smoking status: Never Smoker   . Smokeless tobacco: Never Used  . Alcohol Use: Yes  . Drug Use: No  . Sexual Activity: Not on file   Other Topics Concern  . Not on file   Social History Narrative  . No narrative on file    History reviewed. No pertinent past surgical history.  Family History  Problem Relation Age of Onset  . Cancer Mother   . Thyroid disease Mother   . Diabetes Father   . Hypertension Father   . Mental illness Father   . Depression Neg Hx     family hx    Allergies  Allergen Reactions  . Sulfonamide Derivatives     Current Outpatient Prescriptions on File Prior to Visit  Medication Sig Dispense Refill  . levothyroxine (SYNTHROID, LEVOTHROID) 150 MCG tablet Take 1 tablet (150 mcg total) by mouth daily.  90 tablet  3   No current facility-administered medications on file prior to visit.    BP 130/90  Pulse 110  Temp(Src) 98.8 F (37.1 C) (Oral)  Resp 20  Ht 5' 6.5" (1.689 m)  Wt 293 lb (132.904  kg)  BMI 46.59 kg/m2  SpO2 97%  LMP 04/20/2014      Review of Systems  Constitutional: Positive for unexpected weight change.  HENT: Negative for congestion, dental problem, hearing loss, rhinorrhea, sinus pressure, sore throat and tinnitus.   Eyes: Negative for pain, discharge and visual disturbance.  Respiratory: Negative for cough and shortness of breath.   Cardiovascular: Negative for chest pain, palpitations and leg swelling.  Gastrointestinal: Negative for nausea, vomiting, abdominal pain, diarrhea, constipation, blood in stool and abdominal distention.  Genitourinary: Negative for dysuria, urgency, frequency, hematuria, flank pain, vaginal bleeding, vaginal discharge, difficulty urinating, vaginal pain and pelvic pain.  Musculoskeletal: Negative for arthralgias, gait problem and joint swelling.  Skin: Negative for rash.  Neurological: Negative for dizziness, syncope, speech difficulty, weakness, numbness and headaches.  Hematological: Negative for adenopathy.  Psychiatric/Behavioral: Negative for behavioral problems, dysphoric mood and agitation. The patient is not nervous/anxious.        Objective:   Physical Exam  Constitutional: She is oriented to person, place, and time. She appears well-developed and well-nourished.  Weight 2 9 3  Blood pressure 130/80 Pulse 90  HENT:  Head: Normocephalic and atraumatic.  Right Ear: External ear normal.  Left Ear: External ear normal.  Mouth/Throat: Oropharynx is clear and moist.  Eyes:  Conjunctivae and EOM are normal.  Neck: Normal range of motion. Neck supple. No JVD present. No thyromegaly present.  Cardiovascular: Normal rate, regular rhythm, normal heart sounds and intact distal pulses.   No murmur heard. Pulmonary/Chest: Effort normal and breath sounds normal. She has no wheezes. She has no rales.  Abdominal: Soft. Bowel sounds are normal. She exhibits no distension and no mass. There is no tenderness. There is no rebound and  no guarding.  Genitourinary: Vagina normal.  Musculoskeletal: Normal range of motion. She exhibits no edema and no tenderness.  Neurological: She is alert and oriented to person, place, and time. She has normal reflexes. No cranial nerve deficit. She exhibits normal muscle tone. Coordination normal.  Skin: Skin is warm and dry. No rash noted.  Psychiatric: She has a normal mood and affect. Her behavior is normal.          Assessment & Plan:  Preventive health exam Morbid obesity ADD Hypothyroidism.  We'll check a followup TSH.  Will titrate medication as needed History, migraines, stable  Recheck one year or as needed

## 2014-05-03 NOTE — Patient Instructions (Addendum)
It is important that you exercise regularly, at least 20 minutes 3 to 4 times per week.  If you develop chest pain or shortness of breath seek  medical attention.  Return in one year for follow-up  You need to lose weight.  Consider a lower calorie diet and regular exercise.   Schedule your mammogram.

## 2014-05-03 NOTE — Progress Notes (Signed)
   Subjective:    Patient ID: Madison Tapia, female    DOB: 1970/12/28, 43 y.o.   MRN: [id number]  HPI    Review of Systems     Objective:   Physical Exam        Assessment & Plan:

## 2014-05-04 ENCOUNTER — Other Ambulatory Visit: Payer: Self-pay | Admitting: Internal Medicine

## 2014-05-04 DIAGNOSIS — E039 Hypothyroidism, unspecified: Secondary | ICD-10-CM

## 2014-05-05 ENCOUNTER — Other Ambulatory Visit: Payer: Self-pay | Admitting: *Deleted

## 2014-05-05 MED ORDER — LEVOTHYROXINE SODIUM 175 MCG PO TABS: 175.0000 ug | ORAL_TABLET | Freq: Every day | ORAL | Status: DC

## 2014-09-07 ENCOUNTER — Telehealth: Payer: Self-pay | Admitting: Internal Medicine

## 2014-09-07 MED ORDER — AMPHETAMINE-DEXTROAMPHETAMINE 20 MG PO TABS: 20.0000 mg | ORAL_TABLET | Freq: Every day | ORAL | Status: DC

## 2014-09-07 MED ORDER — AMPHETAMINE-DEXTROAMPHETAMINE 10 MG PO TABS: 10.0000 mg | ORAL_TABLET | Freq: Every day | ORAL | Status: DC | PRN

## 2014-09-07 NOTE — Telephone Encounter (Signed)
Left detailed message Rx's are ready for pickup. Rx's printed and signed by Dr. Todd.  

## 2014-09-07 NOTE — Telephone Encounter (Signed)
Pt requesting refill of the following:  amphetamine-dextroamphetamine (ADDERALL) 10 MG tablet amphetamine-dextroamphetamine (ADDERALL) 20 MG tablet

## 2015-01-03 ENCOUNTER — Other Ambulatory Visit: Payer: Self-pay

## 2015-01-03 DIAGNOSIS — Z1231 Encounter for screening mammogram for malignant neoplasm of breast: Secondary | ICD-10-CM

## 2015-01-10 ENCOUNTER — Ambulatory Visit: Payer: Self-pay

## 2015-01-15 ENCOUNTER — Ambulatory Visit
Admission: RE | Admit: 2015-01-15 | Discharge: 2015-01-15 | Disposition: A | Discharge status: Home / Self Care | Payer: 59 | Source: Ambulatory Visit

## 2015-01-15 DIAGNOSIS — Z1231 Encounter for screening mammogram for malignant neoplasm of breast: Secondary | ICD-10-CM

## 2015-01-16 ENCOUNTER — Other Ambulatory Visit: Payer: Self-pay | Admitting: Internal Medicine

## 2015-01-16 DIAGNOSIS — R928 Other abnormal and inconclusive findings on diagnostic imaging of breast: Secondary | ICD-10-CM

## 2015-01-19 ENCOUNTER — Ambulatory Visit
Admission: RE | Admit: 2015-01-19 | Discharge: 2015-01-19 | Disposition: A | Discharge status: Home / Self Care | Payer: 59 | Source: Ambulatory Visit | Attending: Internal Medicine | Admitting: Internal Medicine

## 2015-01-19 DIAGNOSIS — R928 Other abnormal and inconclusive findings on diagnostic imaging of breast: Secondary | ICD-10-CM

## 2015-02-06 ENCOUNTER — Telehealth: Payer: Self-pay | Admitting: Internal Medicine

## 2015-02-06 NOTE — Telephone Encounter (Signed)
° °

## 2015-02-07 MED ORDER — AMPHETAMINE-DEXTROAMPHETAMINE 20 MG PO TABS: 20.0000 mg | ORAL_TABLET | Freq: Every day | ORAL | Status: DC

## 2015-02-07 MED ORDER — AMPHETAMINE-DEXTROAMPHETAMINE 10 MG PO TABS: 10.0000 mg | ORAL_TABLET | Freq: Every day | ORAL | Status: DC | PRN

## 2015-02-07 NOTE — Telephone Encounter (Signed)
Pt notified Rx ready for pickup. Rx printed and signed.  

## 2015-05-30 ENCOUNTER — Telehealth: Payer: Self-pay | Admitting: Internal Medicine

## 2015-05-30 MED ORDER — AMPHETAMINE-DEXTROAMPHETAMINE 20 MG PO TABS: 20.0000 mg | ORAL_TABLET | Freq: Every day | ORAL | Status: DC

## 2015-05-30 MED ORDER — AMPHETAMINE-DEXTROAMPHETAMINE 10 MG PO TABS: 10.0000 mg | ORAL_TABLET | Freq: Every day | ORAL | Status: DC | PRN

## 2015-05-30 MED ORDER — LEVOTHYROXINE SODIUM 175 MCG PO TABS: 175.0000 ug | ORAL_TABLET | Freq: Every day | ORAL | Status: DC

## 2015-05-30 NOTE — Telephone Encounter (Signed)
Left detailed message Rx's ready for pickup will be at the front desk. Also Rx refill sent to pharmacy. Any questions please call the office. Rx's printed and signed.

## 2015-05-30 NOTE — Telephone Encounter (Signed)
Pt has appt for cpe on 07/06/15 However, will bw out of her meds and will need enough to get through. Pt took last synthroid this am.  Pt request refill  amphetamine-dextroamphetamine (ADDERALL) 10 MG tablet\ amphetamine-dextroamphetamine (ADDERALL) 20 MG tablet  levothyroxine (SYNTHROID, LEVOTHROID) 175 MCG tablet [facility]/ [id number]  ([place]ighborhood neighborhood) pls note pt's new pharm info

## 2015-06-28 ENCOUNTER — Other Ambulatory Visit (INDEPENDENT_AMBULATORY_CARE_PROVIDER_SITE_OTHER): Payer: 59

## 2015-06-28 DIAGNOSIS — Z0000 Encounter for general adult medical examination without abnormal findings: Secondary | ICD-10-CM

## 2015-06-28 DIAGNOSIS — R7989 Other specified abnormal findings of blood chemistry: Secondary | ICD-10-CM | POA: Diagnosis not present

## 2015-06-28 LAB — CBC WITH DIFFERENTIAL/PLATELET
BASOS ABS: 0.1 10*3/uL (ref 0.0–0.1)
Basophils Relative: 0.6 % (ref 0.0–3.0)
Eosinophils Absolute: 0.1 10*3/uL (ref 0.0–0.7)
Eosinophils Relative: 1.2 % (ref 0.0–5.0)
HEMATOCRIT: 38 % (ref 36.0–46.0)
HEMOGLOBIN: 12.5 g/dL (ref 12.0–15.0)
Lymphocytes Relative: 30.2 % (ref 12.0–46.0)
Lymphs Abs: 2.7 10*3/uL (ref 0.7–4.0)
MCHC: 32.9 g/dL (ref 30.0–36.0)
MCV: 76.9 fl — AB (ref 78.0–100.0)
MONOS PCT: 6.6 % (ref 3.0–12.0)
Monocytes Absolute: 0.6 10*3/uL (ref 0.1–1.0)
NEUTROS ABS: 5.5 10*3/uL (ref 1.4–7.7)
Neutrophils Relative %: 61.4 % (ref 43.0–77.0)
PLATELETS: 325 10*3/uL (ref 150.0–400.0)
RBC: 4.95 Mil/uL (ref 3.87–5.11)
RDW: 16.4 % — ABNORMAL HIGH (ref 11.5–15.5)
WBC: 8.9 10*3/uL (ref 4.0–10.5)

## 2015-06-28 LAB — POCT URINALYSIS DIPSTICK
BILIRUBIN UA: NEGATIVE
Blood, UA: NEGATIVE
Glucose, UA: NEGATIVE
KETONES UA: NEGATIVE
NITRITE UA: POSITIVE
Spec Grav, UA: >=1.03
Urobilinogen, UA: 0.2
pH, UA: 6

## 2015-06-28 LAB — HEPATIC FUNCTION PANEL
ALK PHOS: 60 U/L (ref 39–117)
ALT: 19 U/L (ref 0–35)
AST: 18 U/L (ref 0–37)
Albumin: 3.9 g/dL (ref 3.5–5.2)
Bilirubin, Direct: 0.1 mg/dL (ref 0.0–0.3)
TOTAL PROTEIN: 7.4 g/dL (ref 6.0–8.3)
Total Bilirubin: 0.4 mg/dL (ref 0.2–1.2)

## 2015-06-28 LAB — BASIC METABOLIC PANEL
BUN: 14 mg/dL (ref 6–23)
CHLORIDE: 106 meq/L (ref 96–112)
CO2: 25 meq/L (ref 19–32)
Calcium: 9 mg/dL (ref 8.4–10.5)
Creatinine, Ser: 0.85 mg/dL (ref 0.40–1.20)
GFR: 77.26 mL/min (ref >60.00)
Glucose, Bld: 97 mg/dL (ref 70–99)
Potassium: 4.4 mEq/L (ref 3.5–5.1)
Sodium: 140 mEq/L (ref 135–145)

## 2015-06-28 LAB — LDL CHOLESTEROL, DIRECT: LDL DIRECT: 130 mg/dL

## 2015-06-28 LAB — LIPID PANEL
Cholesterol: 176 mg/dL (ref 0–200)
HDL: 45.3 mg/dL (ref >39.00)
NonHDL: 130.29
TRIGLYCERIDES: 218 mg/dL — AB (ref 0.0–149.0)
Total CHOL/HDL Ratio: 4
VLDL: 43.6 mg/dL — AB (ref 0.0–40.0)

## 2015-06-28 LAB — TSH: TSH: 6.52 u[IU]/mL — ABNORMAL HIGH (ref 0.35–4.50)

## 2015-06-29 ENCOUNTER — Other Ambulatory Visit: Payer: Self-pay | Admitting: *Deleted

## 2015-06-29 MED ORDER — LEVOTHYROXINE SODIUM 200 MCG PO TABS: 200.0000 ug | ORAL_TABLET | Freq: Every day | ORAL | Status: DC

## 2015-07-06 ENCOUNTER — Ambulatory Visit (INDEPENDENT_AMBULATORY_CARE_PROVIDER_SITE_OTHER): Payer: 59 | Admitting: Internal Medicine

## 2015-07-06 ENCOUNTER — Encounter: Payer: Self-pay | Admitting: Internal Medicine

## 2015-07-06 VITALS — BP 124/80 | HR 90 | Temp 99.0°F | Ht 66.5 in | Wt 287.0 lb

## 2015-07-06 DIAGNOSIS — Z0000 Encounter for general adult medical examination without abnormal findings: Secondary | ICD-10-CM | POA: Diagnosis not present

## 2015-07-06 DIAGNOSIS — E039 Hypothyroidism, unspecified: Secondary | ICD-10-CM

## 2015-07-06 DIAGNOSIS — F988 Other specified behavioral and emotional disorders with onset usually occurring in childhood and adolescence: Secondary | ICD-10-CM

## 2015-07-06 NOTE — Patient Instructions (Signed)
It is important that you exercise regularly, at least 20 minutes 3 to 4 times per week.  If you develop chest pain or shortness of breath seek  medical attention.  You need to lose weight.  Consider a lower calorie diet and regular exercise.  Health Maintenance, Female Adopting a healthy lifestyle and getting preventive care can go a long way to promote health and wellness. Talk with your health care provider about what schedule of regular examinations is right for you. This is a good chance for you to check in with your provider about disease prevention and staying healthy. In between checkups, there are plenty of things you can do on your own. Experts have done a lot of research about which lifestyle changes and preventive measures are most likely to keep you healthy. Ask your health care provider for more information. WEIGHT AND DIET  Eat a healthy diet  Be sure to include plenty of vegetables, fruits, low-fat dairy products, and lean protein.  Do not eat a lot of foods high in solid fats, added sugars, or salt.  Get regular exercise. This is one of the most important things you can do for your health.  Most adults should exercise for at least 150 minutes each week. The exercise should increase your heart rate and make you sweat (moderate-intensity exercise).  Most adults should also do strengthening exercises at least twice a week. This is in addition to the moderate-intensity exercise.  Maintain a healthy weight  Body mass index (BMI) is a measurement that can be used to identify possible weight problems. It estimates body fat based on height and weight. Your health care provider can help determine your BMI and help you achieve or maintain a healthy weight.  For females 44 years of age and older:   A BMI below 18.5 is considered underweight.  A BMI of 18.5 to 24.9 is normal.  A BMI of 25 to 29.9 is considered overweight.  A BMI of 30 and above is considered obese.  Watch  levels of cholesterol and blood lipids  You should start having your blood tested for lipids and cholesterol at 44 years of age, then have this test every 5 years.  You may need to have your cholesterol levels checked more often if:  Your lipid or cholesterol levels are high.  You are older than 44 years of age.  You are at high risk for heart disease.  CANCER SCREENING   Lung Cancer  Lung cancer screening is recommended for adults 44-7 years old who are at high risk for lung cancer because of a history of smoking.  A yearly low-dose CT scan of the lungs is recommended for people who:  Currently smoke.  Have quit within the past 15 years.  Have at least a 30-pack-year history of smoking. A pack year is smoking an average of one pack of cigarettes a day for 1 year.  Yearly screening should continue until it has been 15 years since you quit.  Yearly screening should stop if you develop a health problem that would prevent you from having lung cancer treatment.  Breast Cancer  Practice breast self-awareness. This means understanding how your breasts normally appear and feel.  It also means doing regular breast self-exams. Let your health care provider know about any changes, no matter how small.  If you are in your 44s or 30s, you should have a clinical breast exam (CBE) by a health care provider every 1-3 years as part of  a regular health exam.  If you are 44 or older, have a CBE every year. Also consider having a breast X-ray (mammogram) every year.  If you have a family history of breast cancer, talk to your health care provider about genetic screening.  If you are at high risk for breast cancer, talk to your health care provider about having an MRI and a mammogram every year.  Breast cancer gene (BRCA) assessment is recommended for women who have family members with BRCA-related cancers. BRCA-related cancers include:  Breast.  Ovarian.  Tubal.  Peritoneal  cancers.  Results of the assessment will determine the need for genetic counseling and BRCA1 and BRCA2 testing. Cervical Cancer Your health care provider may recommend that you be screened regularly for cancer of the pelvic organs (ovaries, uterus, and vagina). This screening involves a pelvic examination, including checking for microscopic changes to the surface of your cervix (Pap test). You may be encouraged to have this screening done every 3 years, beginning at age 44.  For women ages 44-65, health care providers may recommend pelvic exams and Pap testing every 3 years, or they may recommend the Pap and pelvic exam, combined with testing for human papilloma virus (HPV), every 5 years. Some types of HPV increase your risk of cervical cancer. Testing for HPV may also be done on women of any age with unclear Pap test results.  Other health care providers may not recommend any screening for nonpregnant women who are considered low risk for pelvic cancer and who do not have symptoms. Ask your health care provider if a screening pelvic exam is right for you.  If you have had past treatment for cervical cancer or a condition that could lead to cancer, you need Pap tests and screening for cancer for at least 20 years after your treatment. If Pap tests have been discontinued, your risk factors (such as having a new sexual partner) need to be reassessed to determine if screening should resume. Some women have medical problems that increase the chance of getting cervical cancer. In these cases, your health care provider may recommend more frequent screening and Pap tests. Colorectal Cancer  This type of cancer can be detected and often prevented.  Routine colorectal cancer screening usually begins at 44 years of age and continues through 44 years of age.  Your health care provider may recommend screening at an earlier age if you have risk factors for colon cancer.  Your health care provider may also  recommend using home test kits to check for hidden blood in the stool.  A small camera at the end of a tube can be used to examine your colon directly (sigmoidoscopy or colonoscopy). This is done to check for the earliest forms of colorectal cancer.  Routine screening usually begins at age 10.  Direct examination of the colon should be repeated every 5-10 years through 44 years of age. However, you may need to be screened more often if early forms of precancerous polyps or small growths are found. Skin Cancer  Check your skin from head to toe regularly.  Tell your health care provider about any new moles or changes in moles, especially if there is a change in a mole's shape or color.  Also tell your health care provider if you have a mole that is larger than the size of a pencil eraser.  Always use sunscreen. Apply sunscreen liberally and repeatedly throughout the day.  Protect yourself by wearing long sleeves, pants, a wide-brimmed  hat, and sunglasses whenever you are outside. HEART DISEASE, DIABETES, AND HIGH BLOOD PRESSURE   High blood pressure causes heart disease and increases the risk of stroke. High blood pressure is more likely to develop in:  People who have blood pressure in the high end of the normal range (130-139/85-89 mm Hg).  People who are overweight or obese.  People who are African American.  If you are 42-48 years of age, have your blood pressure checked every 3-5 years. If you are 103 years of age or older, have your blood pressure checked every year. You should have your blood pressure measured twice--once when you are at a hospital or clinic, and once when you are not at a hospital or clinic. Record the average of the two measurements. To check your blood pressure when you are not at a hospital or clinic, you can use:  An automated blood pressure machine at a pharmacy.  A home blood pressure monitor.  If you are between 39 years and 54 years old, ask your health  care provider if you should take aspirin to prevent strokes.  Have regular diabetes screenings. This involves taking a blood sample to check your fasting blood sugar level.  If you are at a normal weight and have a low risk for diabetes, have this test once every three years after 44 years of age.  If you are overweight and have a high risk for diabetes, consider being tested at a younger age or more often. PREVENTING INFECTION  Hepatitis B  If you have a higher risk for hepatitis B, you should be screened for this virus. You are considered at high risk for hepatitis B if:  You were born in a country where hepatitis B is common. Ask your health care provider which countries are considered high risk.  Your parents were born in a high-risk country, and you have not been immunized against hepatitis B (hepatitis B vaccine).  You have HIV or AIDS.  You use needles to inject street drugs.  You live with someone who has hepatitis B.  You have had sex with someone who has hepatitis B.  You get hemodialysis treatment.  You take certain medicines for conditions, including cancer, organ transplantation, and autoimmune conditions. Hepatitis C  Blood testing is recommended for:  Everyone born from [birth year] through 1965.  Anyone with known risk factors for hepatitis C. Sexually transmitted infections (STIs)  You should be screened for sexually transmitted infections (STIs) including gonorrhea and chlamydia if:  You are sexually active and are younger than 44 years of age.  You are older than 44 years of age and your health care provider tells you that you are at risk for this type of infection.  Your sexual activity has changed since you were last screened and you are at an increased risk for chlamydia or gonorrhea. Ask your health care provider if you are at risk.  If you do not have HIV, but are at risk, it may be recommended that you take a prescription medicine daily to prevent HIV  infection. This is called pre-exposure prophylaxis (PrEP). You are considered at risk if:  You are sexually active and do not regularly use condoms or know the HIV status of your partner(s).  You take drugs by injection.  You are sexually active with a partner who has HIV. Talk with your health care provider about whether you are at high risk of being infected with HIV. If you choose to begin PrEP, you  should first be tested for HIV. You should then be tested every 3 months for as long as you are taking PrEP.  PREGNANCY   If you are premenopausal and you may become pregnant, ask your health care provider about preconception counseling.  If you may become pregnant, take 400 to 800 micrograms (mcg) of folic acid every day.  If you want to prevent pregnancy, talk to your health care provider about birth control (contraception). OSTEOPOROSIS AND MENOPAUSE   Osteoporosis is a disease in which the bones lose minerals and strength with aging. This can result in serious bone fractures. Your risk for osteoporosis can be identified using a bone density scan.  If you are 29 years of age or older, or if you are at risk for osteoporosis and fractures, ask your health care provider if you should be screened.  Ask your health care provider whether you should take a calcium or vitamin D supplement to lower your risk for osteoporosis.  Menopause may have certain physical symptoms and risks.  Hormone replacement therapy may reduce some of these symptoms and risks. Talk to your health care provider about whether hormone replacement therapy is right for you.  HOME CARE INSTRUCTIONS   Schedule regular health, dental, and eye exams.  Stay current with your immunizations.   Do not use any tobacco products including cigarettes, chewing tobacco, or electronic cigarettes.  If you are pregnant, do not drink alcohol.  If you are breastfeeding, limit how much and how often you drink alcohol.  Limit  alcohol intake to no more than 1 drink per day for nonpregnant women. One drink equals 12 ounces of beer, 5 ounces of wine, or 1 ounces of hard liquor.  Do not use street drugs.  Do not share needles.  Ask your health care provider for help if you need support or information about quitting drugs.  Tell your health care provider if you often feel depressed.  Tell your health care provider if you have ever been abused or do not feel safe at home.   This information is not intended to replace advice given to you by your health care provider. Make sure you discuss any questions you have with your health care provider.   Document Released: 03/31/2011 Document Revised: 10/06/2014 Document Reviewed: 08/17/2013 Elsevier Interactive Patient Education [employer].

## 2015-07-06 NOTE — Progress Notes (Signed)
Subjective:    Patient ID: Madison Tapia, female    DOB: [birth year], 44 y.o.   MRN: [id number]  HPI  Wt Readings from Last 3 Encounters:  07/06/15 287 lb (130.182 kg)  05/03/14 293 lb (132.904 kg)  01/10/13 271 lb (122.925 kg)     Subjective:    Patient ID: Madison Tapia, female    DOB: [birth year], 44 y.o.   MRN: [id number]  HPI  44 year old patient who is in today for a preventive health examination  Medical problems include ADD.  Exogenous obesity, and history of migraine headaches.  She is quite frustrated about inability to lose weight.  She has been followed by a Social worker.  She states that "food is her drug"  Past Medical History  Diagnosis Date  . ADD 07/14/2007  . ANXIETY 06/24/2007  . COMMON MIGRAINE 06/24/2007  . DEPRESSION 06/24/2007  . Headache(784.0) 10/08/2007  . HX, URINARY INFECTION 06/24/2007  . IRRITABLE BOWEL SYNDROME, HX OF 06/24/2007  . MALAISE AND FATIGUE 06/24/2007  . 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 Unspecified hypothyroidism 07/14/2007    Social History   Social History  . Marital Status: Married    Spouse Name: N/A  . Number of Children: N/A  . Years of Education: N/A   Occupational History  . Not on file.   Social History Main Topics  . Smoking status: Never Smoker   . Smokeless tobacco: Never Used  . Alcohol Use: Yes  . Drug Use: No  . Sexual Activity: Not on file   Other Topics Concern  . Not on file   Social History Narrative    No past surgical history on file.  Family History  Problem Relation Age of Onset  . Cancer Mother   . Thyroid disease Mother   . Diabetes Father   . Hypertension Father   . Mental illness Father   . Depression Neg Hx     family hx    Allergies  Allergen Reactions  . Sulfonamide Derivatives     Current Outpatient Prescriptions on File Prior to Visit  Medication Sig Dispense Refill  . amphetamine-dextroamphetamine (ADDERALL) 10 MG tablet Take 1 tablet (10 mg total) by mouth  daily as needed (In the afternoon if needed). 30 tablet 0  . amphetamine-dextroamphetamine (ADDERALL) 10 MG tablet Take 1 tablet (10 mg total) by mouth daily as needed. In the afternoon if needed 30 tablet 0  . amphetamine-dextroamphetamine (ADDERALL) 10 MG tablet Take 1 tablet (10 mg total) by mouth daily as needed (In the afternoon if needed). 30 tablet 0  . amphetamine-dextroamphetamine (ADDERALL) 20 MG tablet Take 1 tablet (20 mg total) by mouth daily. 30 tablet 0  . amphetamine-dextroamphetamine (ADDERALL) 20 MG tablet Take 1 tablet (20 mg total) by mouth daily. 30 tablet 0  . amphetamine-dextroamphetamine (ADDERALL) 20 MG tablet Take 1 tablet (20 mg total) by mouth daily. 30 tablet 0  . levothyroxine (SYNTHROID) 200 MCG tablet Take 1 tablet (200 mcg total) by mouth daily before breakfast. 90 tablet 1   No current facility-administered medications on file prior to visit.    BP 124/80 mmHg  Pulse 90  Temp(Src) 99 F (37.2 C) (Oral)  Ht 5' 6.5" (1.689 m)  Wt 287 lb (130.182 kg)  BMI 45.63 kg/m2  LMP 07/02/2015 (Exact Date)      Review of Systems  Constitutional: Positive for unexpected weight change.  HENT: Negative for congestion, dental problem, hearing loss, rhinorrhea, sinus pressure, sore throat and tinnitus.  Eyes: Negative for pain, discharge and visual disturbance.  Respiratory: Negative for cough and shortness of breath.   Cardiovascular: Negative for chest pain, palpitations and leg swelling.  Gastrointestinal: Negative for nausea, vomiting, abdominal pain, diarrhea, constipation, blood in stool and abdominal distention.  Genitourinary: Negative for dysuria, urgency, frequency, hematuria, flank pain, vaginal bleeding, vaginal discharge, difficulty urinating, vaginal pain and pelvic pain.  Musculoskeletal: Negative for arthralgias, gait problem and joint swelling.  Skin: Negative for rash.  Neurological: Negative for dizziness, syncope, speech difficulty, weakness,  numbness and headaches.  Hematological: Negative for adenopathy.  Psychiatric/Behavioral: Negative for behavioral problems, dysphoric mood and agitation. The patient is not nervous/anxious.        Objective:   Physical Exam  Constitutional: She is oriented to person, place, and time. She appears well-developed and well-nourished.  Weight 287  Blood pressure 124/80 Pulse 90  HENT:  Head: Normocephalic and atraumatic.  Right Ear: External ear normal.  Left Ear: External ear normal.  Mouth/Throat: Oropharynx is clear and moist.  Eyes: Conjunctivae and EOM are normal.  Neck: Normal range of motion. Neck supple. No JVD present. No thyromegaly present.  Cardiovascular: Normal rate, regular rhythm, normal heart sounds and intact distal pulses.   No murmur heard. Pulmonary/Chest: Effort normal and breath sounds normal. She has no wheezes. She has no rales.  Abdominal: Soft. Bowel sounds are normal. She exhibits no distension and no mass. There is no tenderness. There is no rebound and no guarding.  Genitourinary: Vagina normal.  Musculoskeletal: Normal range of motion. She exhibits no edema and no tenderness.  Neurological: She is alert and oriented to person, place, and time. She has normal reflexes. No cranial nerve deficit. She exhibits normal muscle tone. Coordination normal.  Skin: Skin is warm and dry. No rash noted.  Psychiatric: She has a normal mood and affect. Her behavior is normal.          Assessment & Plan:  Preventive health exam Morbid obesity ADD Hypothyroidism.  Patient has had a recent dose up titration.  We'll check a followup TSH in 6 weeks.  Will titrate medication as needed History, migraines, stable  Recheck one year or as needed    Review of Systems As above    Objective:   Physical Exam  As above      Assessment & Plan:   Preventive health exam Morbid obesity Common migraines, stable Hypothyroidism History of nephrolithiasis, stable

## 2015-07-06 NOTE — Progress Notes (Signed)
Pre visit review using our clinic review tool, if applicable. No additional management support is needed unless otherwise documented below in the visit note. Influenza immunization was not given due to patient refusal.

## 2015-08-30 ENCOUNTER — Telehealth: Payer: Self-pay | Admitting: Internal Medicine

## 2015-08-30 NOTE — Telephone Encounter (Signed)
Madison Tapia called saying she has about three days left of Adderral and would like a refill sent to her pharmacy ([facility] on [address]).   Pt ph# [phone] Thank you.

## 2015-08-31 MED ORDER — AMPHETAMINE-DEXTROAMPHETAMINE 10 MG PO TABS: 10.0000 mg | ORAL_TABLET | Freq: Every day | ORAL | Status: DC | PRN

## 2015-08-31 MED ORDER — AMPHETAMINE-DEXTROAMPHETAMINE 20 MG PO TABS: 20.0000 mg | ORAL_TABLET | Freq: Every day | ORAL | Status: DC

## 2015-08-31 NOTE — Telephone Encounter (Signed)
Pt notified Rx's ready for pickup. Rx's printed and signed.  

## 2016-01-01 ENCOUNTER — Telehealth: Payer: Self-pay | Admitting: Internal Medicine

## 2016-01-01 MED ORDER — AMPHETAMINE-DEXTROAMPHETAMINE 10 MG PO TABS: 10.0000 mg | ORAL_TABLET | Freq: Every day | ORAL | Status: DC | PRN

## 2016-01-01 MED ORDER — AMPHETAMINE-DEXTROAMPHETAMINE 20 MG PO TABS: 20.0000 mg | ORAL_TABLET | Freq: Every day | ORAL | Status: DC

## 2016-01-01 MED ORDER — LEVOTHYROXINE SODIUM 200 MCG PO TABS: 200.0000 ug | ORAL_TABLET | Freq: Every day | ORAL | Status: DC

## 2016-01-01 NOTE — Telephone Encounter (Signed)
Pt requesting Rx for Adderall  and Levothyroxine 200 mg.  Pharm [facility]  [address]

## 2016-01-01 NOTE — Telephone Encounter (Signed)
Pt notified Rx's ready for pickup and Rx for Levothyroxine was sent to pharmacy. Pt verbalized understanding. Rx's printed and signed.

## 2016-04-14 ENCOUNTER — Telehealth: Payer: Self-pay | Admitting: Internal Medicine

## 2016-04-14 NOTE — Telephone Encounter (Signed)
Pt request refill  amphetamine-dextroamphetamine (ADDERALL) 10 MG tablet amphetamine-dextroamphetamine (ADDERALL) 20 MG tablet 3 mo supply  Pt has cpe in november

## 2016-04-15 MED ORDER — AMPHETAMINE-DEXTROAMPHETAMINE 10 MG PO TABS: 10.0000 mg | ORAL_TABLET | Freq: Every day | ORAL | Status: DC | PRN

## 2016-04-15 MED ORDER — AMPHETAMINE-DEXTROAMPHETAMINE 20 MG PO TABS: 20.0000 mg | ORAL_TABLET | Freq: Every day | ORAL | Status: DC

## 2016-04-15 NOTE — Telephone Encounter (Signed)
Okay for refill?  

## 2016-04-15 NOTE — Telephone Encounter (Signed)
Last Rxd 01-01-16 for 3 months worth.

## 2016-04-15 NOTE — Telephone Encounter (Signed)
Rx's printed for signature.

## 2016-04-15 NOTE — Telephone Encounter (Signed)
Left message for pt to call office

## 2016-04-15 NOTE — Telephone Encounter (Signed)
Spoke with Pt, informed her tat prescription was up front and ready for pick up. Pt verbalized understanding

## 2016-07-04 ENCOUNTER — Other Ambulatory Visit: Payer: Self-pay | Admitting: Internal Medicine

## 2016-07-11 ENCOUNTER — Other Ambulatory Visit (INDEPENDENT_AMBULATORY_CARE_PROVIDER_SITE_OTHER): Payer: BLUE CROSS/BLUE SHIELD

## 2016-07-11 DIAGNOSIS — R7989 Other specified abnormal findings of blood chemistry: Secondary | ICD-10-CM

## 2016-07-11 DIAGNOSIS — R319 Hematuria, unspecified: Secondary | ICD-10-CM

## 2016-07-11 DIAGNOSIS — Z0000 Encounter for general adult medical examination without abnormal findings: Secondary | ICD-10-CM

## 2016-07-11 LAB — CBC WITH DIFFERENTIAL/PLATELET
BASOS ABS: 0 10*3/uL (ref 0.0–0.1)
Basophils Relative: 0.6 % (ref 0.0–3.0)
EOS ABS: 0.2 10*3/uL (ref 0.0–0.7)
Eosinophils Relative: 2 % (ref 0.0–5.0)
HCT: 38.8 % (ref 36.0–46.0)
HEMOGLOBIN: 13 g/dL (ref 12.0–15.0)
LYMPHS PCT: 28.8 % (ref 12.0–46.0)
Lymphs Abs: 2.3 10*3/uL (ref 0.7–4.0)
MCHC: 33.5 g/dL (ref 30.0–36.0)
MCV: 75.9 fl — ABNORMAL LOW (ref 78.0–100.0)
MONO ABS: 0.6 10*3/uL (ref 0.1–1.0)
Monocytes Relative: 7.3 % (ref 3.0–12.0)
Neutro Abs: 4.9 10*3/uL (ref 1.4–7.7)
Neutrophils Relative %: 61.3 % (ref 43.0–77.0)
Platelets: 311 10*3/uL (ref 150.0–400.0)
RBC: 5.11 Mil/uL (ref 3.87–5.11)
RDW: 16.7 % — AB (ref 11.5–15.5)
WBC: 8 10*3/uL (ref 4.0–10.5)

## 2016-07-11 LAB — POC URINALSYSI DIPSTICK (AUTOMATED)
Bilirubin, UA: NEGATIVE
GLUCOSE UA: NEGATIVE
Nitrite, UA: POSITIVE
SPEC GRAV UA: 1.025
UROBILINOGEN UA: 0.2
pH, UA: 5.5

## 2016-07-11 LAB — BASIC METABOLIC PANEL
BUN: 12 mg/dL (ref 6–23)
CALCIUM: 9 mg/dL (ref 8.4–10.5)
CO2: 24 mEq/L (ref 19–32)
CREATININE: 0.89 mg/dL (ref 0.40–1.20)
Chloride: 106 mEq/L (ref 96–112)
GFR: 72.92 mL/min (ref >60.00)
Glucose, Bld: 106 mg/dL — ABNORMAL HIGH (ref 70–99)
Potassium: 4 mEq/L (ref 3.5–5.1)
Sodium: 139 mEq/L (ref 135–145)

## 2016-07-11 LAB — TSH: TSH: 3.59 u[IU]/mL (ref 0.35–4.50)

## 2016-07-11 LAB — LIPID PANEL
CHOL/HDL RATIO: 4
Cholesterol: 205 mg/dL — ABNORMAL HIGH (ref 0–200)
HDL: 48.9 mg/dL (ref >39.00)
Triglycerides: 502 mg/dL — ABNORMAL HIGH (ref 0.0–149.0)

## 2016-07-11 LAB — HEPATIC FUNCTION PANEL
ALK PHOS: 65 U/L (ref 39–117)
ALT: 13 U/L (ref 0–35)
AST: 16 U/L (ref 0–37)
Albumin: 4.1 g/dL (ref 3.5–5.2)
BILIRUBIN DIRECT: 0 mg/dL (ref 0.0–0.3)
BILIRUBIN TOTAL: 0.4 mg/dL (ref 0.2–1.2)
Total Protein: 7.1 g/dL (ref 6.0–8.3)

## 2016-07-11 LAB — LDL CHOLESTEROL, DIRECT: LDL DIRECT: 108 mg/dL

## 2016-07-15 LAB — URINE CULTURE

## 2016-07-16 ENCOUNTER — Telehealth: Payer: Self-pay | Admitting: *Deleted

## 2016-07-16 MED ORDER — AMOXICILLIN 500 MG PO CAPS: 500.0000 mg | ORAL_CAPSULE | Freq: Three times a day (TID) | ORAL | 0 refills | Status: DC

## 2016-07-16 NOTE — Telephone Encounter (Signed)
-----   Message from Marletta Lor, MD sent at 07/15/2016  5:28 PM EDT ----- Please call in a prescription for amoxicillin 500 mg #21, 1 tablet 3 times a day

## 2016-07-16 NOTE — Telephone Encounter (Signed)
Spoke to pt, told her urinalysis that was done showed that she has an infection. Need to start Amoxicillin 500 mg one tab three times a day x 7 days. Rx sent to pharmacy. Pt verbalized understanding.

## 2016-08-11 ENCOUNTER — Other Ambulatory Visit (HOSPITAL_COMMUNITY)
Admission: RE | Admit: 2016-08-11 | Discharge: 2016-08-11 | Disposition: A | Discharge status: Home / Self Care | Payer: BLUE CROSS/BLUE SHIELD | Source: Ambulatory Visit | Attending: Internal Medicine | Admitting: Internal Medicine

## 2016-08-11 ENCOUNTER — Encounter: Payer: Self-pay | Admitting: Internal Medicine

## 2016-08-11 ENCOUNTER — Ambulatory Visit (INDEPENDENT_AMBULATORY_CARE_PROVIDER_SITE_OTHER): Payer: BLUE CROSS/BLUE SHIELD | Admitting: Internal Medicine

## 2016-08-11 VITALS — BP 146/94 | HR 103 | Temp 98.8°F | Resp 20 | Ht 66.0 in | Wt 301.4 lb

## 2016-08-11 DIAGNOSIS — Z124 Encounter for screening for malignant neoplasm of cervix: Secondary | ICD-10-CM | POA: Diagnosis not present

## 2016-08-11 DIAGNOSIS — Z01419 Encounter for gynecological examination (general) (routine) without abnormal findings: Secondary | ICD-10-CM | POA: Insufficient documentation

## 2016-08-11 DIAGNOSIS — Z0000 Encounter for general adult medical examination without abnormal findings: Secondary | ICD-10-CM

## 2016-08-11 MED ORDER — AMPHETAMINE-DEXTROAMPHETAMINE 10 MG PO TABS: 10.0000 mg | ORAL_TABLET | Freq: Every day | ORAL | 0 refills | Status: DC | PRN

## 2016-08-11 MED ORDER — AMPHETAMINE-DEXTROAMPHETAMINE 20 MG PO TABS: 20.0000 mg | ORAL_TABLET | Freq: Every day | ORAL | 0 refills | Status: DC

## 2016-08-11 NOTE — Progress Notes (Signed)
Pre visit review using our clinic review tool, if applicable. No additional management support is needed unless otherwise documented below in the visit note. 

## 2016-08-11 NOTE — Progress Notes (Signed)
Subjective:    Patient ID: Madison Tapia, female    DOB: Jul 20, 1971, 45 y.o.   MRN: [id number]  HPI  45 year old patient who is seen today for a preventive health examination She has a history of exogenous obesity.  Other medical issues include history of migraines, IBS and history of anxiety disorder.  She has ADHD and has been on Adderall.  She has a history of nephrolithiasis. Family history is pertinent with a mother with history of breast cancer.  Patient is scheduled for a mammogram in 2 months Since her last exam, there has been a 14 pound weight gain She has treated hypothyroidism  Past Medical History:  Diagnosis Date  . ADD 10/45/2008  . ANXIETY 06/24/2007  . COMMON MIGRAINE 06/24/2007  . DEPRESSION 06/24/2007  . Headache(784.0) 45/05/2008  . HX, URINARY INFECTION 06/24/2007  . IRRITABLE BOWEL SYNDROME, HX OF 06/24/2007  . MALAISE AND FATIGUE 06/24/2007  . 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 Unspecified hypothyroidism 07/14/2007     Social History   Social History  . Marital status: Married    Spouse name: N/A  . Number of children: N/A  . Years of education: N/A   Occupational History  . Not on file.   Social History Main Topics  . Smoking status: Never Smoker  . Smokeless tobacco: Never Used  . Alcohol use Yes  . Drug use: No  . Sexual activity: Not on file   Other Topics Concern  . Not on file   Social History Narrative  . No narrative on file    No past surgical history on file.  Family History  Problem Relation Age of Onset  . Cancer Mother   . Thyroid disease Mother   . Diabetes Father   . Hypertension Father   . Mental illness Father   . Depression Neg Hx     family hx    Allergies  Allergen Reactions  . Sulfonamide Derivatives     Current Outpatient Prescriptions on File Prior to Visit  Medication Sig Dispense Refill  . levothyroxine (SYNTHROID, LEVOTHROID) 200 MCG tablet TAKE ONE TABLET BY MOUTH ONCE DAILY BEFORE BREAKFAST 90  tablet 1   No current facility-administered medications on file prior to visit.     BP (!) 146/94 (BP Location: Left Arm, Patient Position: Sitting, Cuff Size: Large)   Pulse (!) 103   Temp 98.8 F (37.1 C) (Oral)   Resp 20   Ht 5\' 6"  (1.676 m)   Wt (!) 301 lb 6.1 oz (136.7 kg)   LMP 07/28/2016   SpO2 98%   BMI 48.64 kg/m    Review of Systems  Constitutional: Negative.   HENT: Negative for congestion, dental problem, hearing loss, rhinorrhea, sinus pressure, sore throat and tinnitus.   Eyes: Negative for pain, discharge and visual disturbance.  Respiratory: Negative for cough and shortness of breath.   Cardiovascular: Negative for chest pain, palpitations and leg swelling.  Gastrointestinal: Negative for abdominal distention, abdominal pain, blood in stool, constipation, diarrhea, nausea and vomiting.  Genitourinary: Negative for difficulty urinating, dysuria, flank pain, frequency, hematuria, pelvic pain, urgency, vaginal bleeding, vaginal discharge and vaginal pain.  Musculoskeletal: Negative for arthralgias, gait problem and joint swelling.  Skin: Negative for rash.  Neurological: Negative for dizziness, syncope, speech difficulty, weakness, numbness and headaches.  Hematological: Negative for adenopathy.  Psychiatric/Behavioral: Negative for agitation, behavioral problems and dysphoric mood. The patient is nervous/anxious.        Objective:   Physical Exam  Constitutional: She is oriented to person, place, and time. She appears well-developed and well-nourished.  HENT:  Head: Normocephalic and atraumatic.  Right Ear: External ear normal.  Left Ear: External ear normal.  Mouth/Throat: Oropharynx is clear and moist.  Eyes: Conjunctivae and EOM are normal. Pupils are equal, round, and reactive to light.  Neck: Normal range of motion. Neck supple. No JVD present. No thyromegaly present.  Cardiovascular: Normal rate, regular rhythm, normal heart sounds and intact distal  pulses.   No murmur heard. Pulmonary/Chest: Effort normal and breath sounds normal. She has no wheezes. She has no rales.  Abdominal: Soft. Bowel sounds are normal. She exhibits no distension and no mass. There is no tenderness. There is no rebound and no guarding.  Genitourinary: Vagina normal.  Genitourinary Comments: Pap obtained, cervix and uterus appeared normal  Musculoskeletal: Normal range of motion. She exhibits no edema or tenderness.  Lymphadenopathy:    She has no cervical adenopathy.  Neurological: She is alert and oriented to person, place, and time. She has normal reflexes. No cranial nerve deficit. She exhibits normal muscle tone. Coordination normal.  Skin: Skin is warm and dry. No rash noted.  Psychiatric: She has a normal mood and affect. Her behavior is normal.          Assessment & Plan:   Preventive health examination. Exogenous obesity.  Weight loss encouraged.  Hypothyroidism.  Continue levothyroxine ADHD.  Adderall refilled  Exercise, weight loss encouraged Follow-up behavioral health counselor Mammogram scheduled  Follow-up one year or as needed  Nyoka Cowden

## 2016-08-11 NOTE — Patient Instructions (Addendum)
Health Maintenance, Female Adopting a healthy lifestyle and getting preventive care can go a long way to promote health and wellness. Talk with your health care provider about what schedule of regular examinations is right for you. This is a good chance for you to check in with your provider about disease prevention and staying healthy. In between checkups, there are plenty of things you can do on your own. Experts have done a lot of research about which lifestyle changes and preventive measures are most likely to keep you healthy. Ask your health care provider for more information. WEIGHT AND DIET  Eat a healthy diet  Be sure to include plenty of vegetables, fruits, low-fat dairy products, and lean protein.  Do not eat a lot of foods high in solid fats, added sugars, or salt.  Get regular exercise. This is one of the most important things you can do for your health.  Most adults should exercise for at least 150 minutes each week. The exercise should increase your heart rate and make you sweat (moderate-intensity exercise).  Most adults should also do strengthening exercises at least twice a week. This is in addition to the moderate-intensity exercise.  Maintain a healthy weight  Body mass index (BMI) is a measurement that can be used to identify possible weight problems. It estimates body fat based on height and weight. Your health care provider can help determine your BMI and help you achieve or maintain a healthy weight.  For females 20 years of age and older:   A BMI below 18.5 is considered underweight.  A BMI of 18.5 to 24.9 is normal.  A BMI of 25 to 29.9 is considered overweight.  A BMI of 30 and above is considered obese.  Watch levels of cholesterol and blood lipids  You should start having your blood tested for lipids and cholesterol at 45 years of age, then have this test every 5 years.  You may need to have your cholesterol levels checked more often if:  Your lipid  or cholesterol levels are high.  You are older than 45 years of age.  You are at high risk for heart disease.  CANCER SCREENING   Lung Cancer  Lung cancer screening is recommended for adults 55-80 years old who are at high risk for lung cancer because of a history of smoking.  A yearly low-dose CT scan of the lungs is recommended for people who:  Currently smoke.  Have quit within the past 15 years.  Have at least a 30-pack-year history of smoking. A pack year is smoking an average of one pack of cigarettes a day for 1 year.  Yearly screening should continue until it has been 15 years since you quit.  Yearly screening should stop if you develop a health problem that would prevent you from having lung cancer treatment.  Breast Cancer  Practice breast self-awareness. This means understanding how your breasts normally appear and feel.  It also means doing regular breast self-exams. Let your health care provider know about any changes, no matter how small.  If you are in your 20s or 30s, you should have a clinical breast exam (CBE) by a health care provider every 1-3 years as part of a regular health exam.  If you are 40 or older, have a CBE every year. Also consider having a breast X-ray (mammogram) every year.  If you have a family history of breast cancer, talk to your health care provider about genetic screening.  If you   are at high risk for breast cancer, talk to your health care provider about having an MRI and a mammogram every year.  Breast cancer gene (BRCA) assessment is recommended for women who have family members with BRCA-related cancers. BRCA-related cancers include:  Breast.  Ovarian.  Tubal.  Peritoneal cancers.  Results of the assessment will determine the need for genetic counseling and BRCA1 and BRCA2 testing. Cervical Cancer Your health care provider may recommend that you be screened regularly for cancer of the pelvic organs (ovaries, uterus, and  vagina). This screening involves a pelvic examination, including checking for microscopic changes to the surface of your cervix (Pap test). You may be encouraged to have this screening done every 3 years, beginning at age 21.  For women ages 30-65, health care providers may recommend pelvic exams and Pap testing every 3 years, or they may recommend the Pap and pelvic exam, combined with testing for human papilloma virus (HPV), every 5 years. Some types of HPV increase your risk of cervical cancer. Testing for HPV may also be done on women of any age with unclear Pap test results.  Other health care providers may not recommend any screening for nonpregnant women who are considered low risk for pelvic cancer and who do not have symptoms. Ask your health care provider if a screening pelvic exam is right for you.  If you have had past treatment for cervical cancer or a condition that could lead to cancer, you need Pap tests and screening for cancer for at least 20 years after your treatment. If Pap tests have been discontinued, your risk factors (such as having a new sexual partner) need to be reassessed to determine if screening should resume. Some women have medical problems that increase the chance of getting cervical cancer. In these cases, your health care provider may recommend more frequent screening and Pap tests. Colorectal Cancer  This type of cancer can be detected and often prevented.  Routine colorectal cancer screening usually begins at 45 years of age and continues through 45 years of age.  Your health care provider may recommend screening at an earlier age if you have risk factors for colon cancer.  Your health care provider may also recommend using home test kits to check for hidden blood in the stool.  A small camera at the end of a tube can be used to examine your colon directly (sigmoidoscopy or colonoscopy). This is done to check for the earliest forms of colorectal  cancer.  Routine screening usually begins at age 50.  Direct examination of the colon should be repeated every 5-10 years through 45 years of age. However, you may need to be screened more often if early forms of precancerous polyps or small growths are found. Skin Cancer  Check your skin from head to toe regularly.  Tell your health care provider about any new moles or changes in moles, especially if there is a change in a mole's shape or color.  Also tell your health care provider if you have a mole that is larger than the size of a pencil eraser.  Always use sunscreen. Apply sunscreen liberally and repeatedly throughout the day.  Protect yourself by wearing long sleeves, pants, a wide-brimmed hat, and sunglasses whenever you are outside. HEART DISEASE, DIABETES, AND HIGH BLOOD PRESSURE   High blood pressure causes heart disease and increases the risk of stroke. High blood pressure is more likely to develop in:  People who have blood pressure in the high end   of the normal range (130-139/85-89 mm Hg).  People who are overweight or obese.  People who are African American.  If you are 38-23 years of age, have your blood pressure checked every 3-5 years. If you are 61 years of age or older, have your blood pressure checked every year. You should have your blood pressure measured twice--once when you are at a hospital or clinic, and once when you are not at a hospital or clinic. Record the average of the two measurements. To check your blood pressure when you are not at a hospital or clinic, you can use:  An automated blood pressure machine at a pharmacy.  A home blood pressure monitor.  If you are between 45 years and 39 years old, ask your health care provider if you should take aspirin to prevent strokes.  Have regular diabetes screenings. This involves taking a blood sample to check your fasting blood sugar level.  If you are at a normal weight and have a low risk for diabetes,  have this test once every three years after 45 years of age.  If you are overweight and have a high risk for diabetes, consider being tested at a younger age or more often. PREVENTING INFECTION  Hepatitis B  If you have a higher risk for hepatitis B, you should be screened for this virus. You are considered at high risk for hepatitis B if:  You were born in a country where hepatitis B is common. Ask your health care provider which countries are considered high risk.  Your parents were born in a high-risk country, and you have not been immunized against hepatitis B (hepatitis B vaccine).  You have HIV or AIDS.  You use needles to inject street drugs.  You live with someone who has hepatitis B.  You have had sex with someone who has hepatitis B.  You get hemodialysis treatment.  You take certain medicines for conditions, including cancer, organ transplantation, and autoimmune conditions. Hepatitis C  Blood testing is recommended for:  Everyone born from [birth year] through 1965.  Anyone with known risk factors for hepatitis C. Sexually transmitted infections (STIs)  You should be screened for sexually transmitted infections (STIs) including gonorrhea and chlamydia if:  You are sexually active and are younger than 45 years of age.  You are older than 45 years of age and your health care provider tells you that you are at risk for this type of infection.  Your sexual activity has changed since you were last screened and you are at an increased risk for chlamydia or gonorrhea. Ask your health care provider if you are at risk.  If you do not have HIV, but are at risk, it may be recommended that you take a prescription medicine daily to prevent HIV infection. This is called pre-exposure prophylaxis (PrEP). You are considered at risk if:  You are sexually active and do not regularly use condoms or know the HIV status of your partner(s).  You take drugs by injection.  You are sexually  active with a partner who has HIV. Talk with your health care provider about whether you are at high risk of being infected with HIV. If you choose to begin PrEP, you should first be tested for HIV. You should then be tested every 3 months for as long as you are taking PrEP.  PREGNANCY   If you are premenopausal and you may become pregnant, ask your health care provider about preconception counseling.  If you may  become pregnant, take 400 to 800 micrograms (mcg) of folic acid every day.  If you want to prevent pregnancy, talk to your health care provider about birth control (contraception). OSTEOPOROSIS AND MENOPAUSE   Osteoporosis is a disease in which the bones lose minerals and strength with aging. This can result in serious bone fractures. Your risk for osteoporosis can be identified using a bone density scan.  If you are 65 years of age or older, or if you are at risk for osteoporosis and fractures, ask your health care provider if you should be screened.  Ask your health care provider whether you should take a calcium or vitamin D supplement to lower your risk for osteoporosis.  Menopause may have certain physical symptoms and risks.  Hormone replacement therapy may reduce some of these symptoms and risks. Talk to your health care provider about whether hormone replacement therapy is right for you.  HOME CARE INSTRUCTIONS   Schedule regular health, dental, and eye exams.  Stay current with your immunizations.   Do not use any tobacco products including cigarettes, chewing tobacco, or electronic cigarettes.  If you are pregnant, do not drink alcohol.  If you are breastfeeding, limit how much and how often you drink alcohol.  Limit alcohol intake to no more than 1 drink per day for nonpregnant women. One drink equals 12 ounces of beer, 5 ounces of wine, or 1 ounces of hard liquor.  Do not use street drugs.  Do not share needles.  Ask your health care provider for help if  you need support or information about quitting drugs.  Tell your health care provider if you often feel depressed.  Tell your health care provider if you have ever been abused or do not feel safe at home.   This information is not intended to replace advice given to you by your health care provider. Make sure you discuss any questions you have with your health care provider.   Document Released: 03/31/2011 Document Revised: 10/06/2014 Document Reviewed: 08/17/2013 [employer] Interactive Patient Education 2016 [employer] Health Maintenance, Female Adopting a healthy lifestyle and getting preventive care can go a long way to promote health and wellness. Talk with your health care provider about what schedule of regular examinations is right for you. This is a good chance for you to check in with your provider about disease prevention and staying healthy. In between checkups, there are plenty of things you can do on your own. Experts have done a lot of research about which lifestyle changes and preventive measures are most likely to keep you healthy. Ask your health care provider for more information. WEIGHT AND DIET  Eat a healthy diet  Be sure to include plenty of vegetables, fruits, low-fat dairy products, and lean protein.  Do not eat a lot of foods high in solid fats, added sugars, or salt.  Get regular exercise. This is one of the most important things you can do for your health.  Most adults should exercise for at least 150 minutes each week. The exercise should increase your heart rate and make you sweat (moderate-intensity exercise).  Most adults should also do strengthening exercises at least twice a week. This is in addition to the moderate-intensity exercise.  Maintain a healthy weight  Body mass index (BMI) is a measurement that can be used to identify possible weight problems. It estimates body fat based on height and weight. Your health care provider can help determine your  BMI and help you achieve   or maintain a healthy weight.  For females 20 years of age and older:   A BMI below 18.5 is considered underweight.  A BMI of 18.5 to 24.9 is normal.  A BMI of 25 to 29.9 is considered overweight.  A BMI of 30 and above is considered obese.  Watch levels of cholesterol and blood lipids  You should start having your blood tested for lipids and cholesterol at 45 years of age, then have this test every 5 years.  You may need to have your cholesterol levels checked more often if:  Your lipid or cholesterol levels are high.  You are older than 45 years of age.  You are at high risk for heart disease.  CANCER SCREENING   Lung Cancer  Lung cancer screening is recommended for adults 55-80 years old who are at high risk for lung cancer because of a history of smoking.  A yearly low-dose CT scan of the lungs is recommended for people who:  Currently smoke.  Have quit within the past 15 years.  Have at least a 30-pack-year history of smoking. A pack year is smoking an average of one pack of cigarettes a day for 1 year.  Yearly screening should continue until it has been 15 years since you quit.  Yearly screening should stop if you develop a health problem that would prevent you from having lung cancer treatment.  Breast Cancer  Practice breast self-awareness. This means understanding how your breasts normally appear and feel.  It also means doing regular breast self-exams. Let your health care provider know about any changes, no matter how small.  If you are in your 20s or 30s, you should have a clinical breast exam (CBE) by a health care provider every 1-3 years as part of a regular health exam.  If you are 40 or older, have a CBE every year. Also consider having a breast X-ray (mammogram) every year.  If you have a family history of breast cancer, talk to your health care provider about genetic screening.  If you are at high risk for breast  cancer, talk to your health care provider about having an MRI and a mammogram every year.  Breast cancer gene (BRCA) assessment is recommended for women who have family members with BRCA-related cancers. BRCA-related cancers include:  Breast.  Ovarian.  Tubal.  Peritoneal cancers.  Results of the assessment will determine the need for genetic counseling and BRCA1 and BRCA2 testing. Cervical Cancer Your health care provider may recommend that you be screened regularly for cancer of the pelvic organs (ovaries, uterus, and vagina). This screening involves a pelvic examination, including checking for microscopic changes to the surface of your cervix (Pap test). You may be encouraged to have this screening done every 3 years, beginning at age 21.  For women ages 30-65, health care providers may recommend pelvic exams and Pap testing every 3 years, or they may recommend the Pap and pelvic exam, combined with testing for human papilloma virus (HPV), every 5 years. Some types of HPV increase your risk of cervical cancer. Testing for HPV may also be done on women of any age with unclear Pap test results.  Other health care providers may not recommend any screening for nonpregnant women who are considered low risk for pelvic cancer and who do not have symptoms. Ask your health care provider if a screening pelvic exam is right for you.  If you have had past treatment for cervical cancer or a condition that   could lead to cancer, you need Pap tests and screening for cancer for at least 20 years after your treatment. If Pap tests have been discontinued, your risk factors (such as having a new sexual partner) need to be reassessed to determine if screening should resume. Some women have medical problems that increase the chance of getting cervical cancer. In these cases, your health care provider may recommend more frequent screening and Pap tests. Colorectal Cancer  This type of cancer can be detected and  often prevented.  Routine colorectal cancer screening usually begins at 45 years of age and continues through 45 years of age.  Your health care provider may recommend screening at an earlier age if you have risk factors for colon cancer.  Your health care provider may also recommend using home test kits to check for hidden blood in the stool.  A small camera at the end of a tube can be used to examine your colon directly (sigmoidoscopy or colonoscopy). This is done to check for the earliest forms of colorectal cancer.  Routine screening usually begins at age 33.  Direct examination of the colon should be repeated every 5-10 years through 45 years of age. However, you may need to be screened more often if early forms of precancerous polyps or small growths are found. Skin Cancer  Check your skin from head to toe regularly.  Tell your health care provider about any new moles or changes in moles, especially if there is a change in a mole's shape or color.  Also tell your health care provider if you have a mole that is larger than the size of a pencil eraser.  Always use sunscreen. Apply sunscreen liberally and repeatedly throughout the day.  Protect yourself by wearing long sleeves, pants, a wide-brimmed hat, and sunglasses whenever you are outside. HEART DISEASE, DIABETES, AND HIGH BLOOD PRESSURE   High blood pressure causes heart disease and increases the risk of stroke. High blood pressure is more likely to develop in:  People who have blood pressure in the high end of the normal range (130-139/85-89 mm Hg).  People who are overweight or obese.  People who are African American.  If you are 47-70 years of age, have your blood pressure checked every 3-5 years. If you are 65 years of age or older, have your blood pressure checked every year. You should have your blood pressure measured twice--once when you are at a hospital or clinic, and once when you are not at a hospital or clinic.  Record the average of the two measurements. To check your blood pressure when you are not at a hospital or clinic, you can use:  An automated blood pressure machine at a pharmacy.  A home blood pressure monitor.  If you are between 15 years and 40 years old, ask your health care provider if you should take aspirin to prevent strokes.  Have regular diabetes screenings. This involves taking a blood sample to check your fasting blood sugar level.  If you are at a normal weight and have a low risk for diabetes, have this test once every three years after 45 years of age.  If you are overweight and have a high risk for diabetes, consider being tested at a younger age or more often. PREVENTING INFECTION  Hepatitis B  If you have a higher risk for hepatitis B, you should be screened for this virus. You are considered at high risk for hepatitis B if:  You were born in  a country where hepatitis B is common. Ask your health care provider which countries are considered high risk.  Your parents were born in a high-risk country, and you have not been immunized against hepatitis B (hepatitis B vaccine).  You have HIV or AIDS.  You use needles to inject street drugs.  You live with someone who has hepatitis B.  You have had sex with someone who has hepatitis B.  You get hemodialysis treatment.  You take certain medicines for conditions, including cancer, organ transplantation, and autoimmune conditions. Hepatitis C  Blood testing is recommended for:  Everyone born from 1945 through 1965.  Anyone with known risk factors for hepatitis C. Sexually transmitted infections (STIs)  You should be screened for sexually transmitted infections (STIs) including gonorrhea and chlamydia if:  You are sexually active and are younger than 45 years of age.  You are older than 45 years of age and your health care provider tells you that you are at risk for this type of infection.  Your sexual activity  has changed since you were last screened and you are at an increased risk for chlamydia or gonorrhea. Ask your health care provider if you are at risk.  If you do not have HIV, but are at risk, it may be recommended that you take a prescription medicine daily to prevent HIV infection. This is called pre-exposure prophylaxis (PrEP). You are considered at risk if:  You are sexually active and do not regularly use condoms or know the HIV status of your partner(s).  You take drugs by injection.  You are sexually active with a partner who has HIV. Talk with your health care provider about whether you are at high risk of being infected with HIV. If you choose to begin PrEP, you should first be tested for HIV. You should then be tested every 3 months for as long as you are taking PrEP.  PREGNANCY   If you are premenopausal and you may become pregnant, ask your health care provider about preconception counseling.  If you may become pregnant, take 400 to 800 micrograms (mcg) of folic acid every day.  If you want to prevent pregnancy, talk to your health care provider about birth control (contraception). OSTEOPOROSIS AND MENOPAUSE   Osteoporosis is a disease in which the bones lose minerals and strength with aging. This can result in serious bone fractures. Your risk for osteoporosis can be identified using a bone density scan.  If you are 65 years of age or older, or if you are at risk for osteoporosis and fractures, ask your health care provider if you should be screened.  Ask your health care provider whether you should take a calcium or vitamin D supplement to lower your risk for osteoporosis.  Menopause may have certain physical symptoms and risks.  Hormone replacement therapy may reduce some of these symptoms and risks. Talk to your health care provider about whether hormone replacement therapy is right for you.  HOME CARE INSTRUCTIONS   Schedule regular health, dental, and eye  exams.  Stay current with your immunizations.   Do not use any tobacco products including cigarettes, chewing tobacco, or electronic cigarettes.  If you are pregnant, do not drink alcohol.  If you are breastfeeding, limit how much and how often you drink alcohol.  Limit alcohol intake to no more than 1 drink per day for nonpregnant women. One drink equals 12 ounces of beer, 5 ounces of wine, or 1 ounces of hard liquor.    Do not use street drugs.  Do not share needles.  Ask your health care provider for help if you need support or information about quitting drugs.  Tell your health care provider if you often feel depressed.  Tell your health care provider if you have ever been abused or do not feel safe at home.   This information is not intended to replace advice given to you by your health care provider. Make sure you discuss any questions you have with your health care provider.   Document Released: 03/31/2011 Document Revised: 10/06/2014 Document Reviewed: 08/17/2013 [employer] Interactive Patient [employer].

## 2016-08-13 LAB — CYTOLOGY - PAP: DIAGNOSIS: NEGATIVE

## 2016-08-26 DIAGNOSIS — D485 Neoplasm of uncertain behavior of skin: Secondary | ICD-10-CM | POA: Diagnosis not present

## 2016-08-26 DIAGNOSIS — Z86018 Personal history of other benign neoplasm: Secondary | ICD-10-CM | POA: Diagnosis not present

## 2016-08-26 DIAGNOSIS — L814 Other melanin hyperpigmentation: Secondary | ICD-10-CM | POA: Diagnosis not present

## 2016-08-26 DIAGNOSIS — L821 Other seborrheic keratosis: Secondary | ICD-10-CM | POA: Diagnosis not present

## 2016-08-26 DIAGNOSIS — D1801 Hemangioma of skin and subcutaneous tissue: Secondary | ICD-10-CM | POA: Diagnosis not present

## 2016-08-26 DIAGNOSIS — D0359 Melanoma in situ of other part of trunk: Secondary | ICD-10-CM | POA: Diagnosis not present

## 2016-09-10 DIAGNOSIS — D0359 Melanoma in situ of other part of trunk: Secondary | ICD-10-CM | POA: Diagnosis not present

## 2016-09-29 HISTORY — PX: OTHER SURGICAL HISTORY: SHX169

## 2016-11-06 ENCOUNTER — Other Ambulatory Visit: Payer: Self-pay | Admitting: Internal Medicine

## 2016-11-06 DIAGNOSIS — E559 Vitamin D deficiency, unspecified: Secondary | ICD-10-CM

## 2016-11-14 ENCOUNTER — Ambulatory Visit
Admission: RE | Admit: 2016-11-14 | Discharge: 2016-11-14 | Disposition: A | Discharge status: Home / Self Care | Payer: BLUE CROSS/BLUE SHIELD | Source: Ambulatory Visit | Attending: Internal Medicine | Admitting: Internal Medicine

## 2016-11-14 DIAGNOSIS — Z1231 Encounter for screening mammogram for malignant neoplasm of breast: Secondary | ICD-10-CM | POA: Diagnosis not present

## 2016-11-14 DIAGNOSIS — E559 Vitamin D deficiency, unspecified: Secondary | ICD-10-CM

## 2017-01-02 ENCOUNTER — Other Ambulatory Visit: Payer: Self-pay | Admitting: Internal Medicine

## 2017-01-26 ENCOUNTER — Telehealth: Payer: Self-pay | Admitting: Internal Medicine

## 2017-01-26 NOTE — Telephone Encounter (Signed)
° °  Pt request refill of the following:  amphetamine-dextroamphetamine (ADDERALL) 10 MG  tablet  amphetamine-dextroamphetamine (ADDERALL) 20 MG tablet    Phamacy:

## 2017-01-27 MED ORDER — AMPHETAMINE-DEXTROAMPHETAMINE 20 MG PO TABS: 20.0000 mg | ORAL_TABLET | Freq: Every day | ORAL | 0 refills | Status: DC

## 2017-01-27 MED ORDER — AMPHETAMINE-DEXTROAMPHETAMINE 10 MG PO TABS: 10.0000 mg | ORAL_TABLET | Freq: Every day | ORAL | 0 refills | Status: DC | PRN

## 2017-01-28 NOTE — Telephone Encounter (Signed)
Pt notified Rx ready for pickup. Rx printed and signed.  

## 2017-04-27 ENCOUNTER — Encounter: Payer: Self-pay | Admitting: Internal Medicine

## 2017-04-27 ENCOUNTER — Ambulatory Visit (INDEPENDENT_AMBULATORY_CARE_PROVIDER_SITE_OTHER): Payer: BLUE CROSS/BLUE SHIELD | Admitting: Internal Medicine

## 2017-04-27 VITALS — BP 144/80 | HR 98 | Temp 98.6°F | Ht 66.0 in | Wt 296.2 lb

## 2017-04-27 DIAGNOSIS — M722 Plantar fascial fibromatosis: Secondary | ICD-10-CM | POA: Diagnosis not present

## 2017-04-27 MED ORDER — AMPHETAMINE-DEXTROAMPHETAMINE 10 MG PO TABS: 10.0000 mg | ORAL_TABLET | Freq: Every day | ORAL | 0 refills | Status: DC | PRN

## 2017-04-27 MED ORDER — MELOXICAM 15 MG PO TABS: 15.0000 mg | ORAL_TABLET | Freq: Every day | ORAL | 0 refills | Status: DC

## 2017-04-27 MED ORDER — AMPHETAMINE-DEXTROAMPHETAMINE 20 MG PO TABS: 20.0000 mg | ORAL_TABLET | Freq: Every day | ORAL | 0 refills | Status: DC

## 2017-04-27 NOTE — Patient Instructions (Signed)
Plantar Fasciitis Plantar fasciitis is a painful foot condition that affects the heel. It occurs when the band of tissue that connects the toes to the heel bone (plantar fascia) becomes irritated. This can happen after exercising too much or doing other repetitive activities (overuse injury). The pain from plantar fasciitis can range from mild irritation to severe pain that makes it difficult for you to walk or move. The pain is usually worse in the morning or after you have been sitting or lying down for a while. What are the causes? This condition may be caused by:  Standing for long periods of time.  Wearing shoes that do not fit.  Doing high-impact activities, including running, aerobics, and ballet.  Being overweight.  Having an abnormal way of walking (gait).  Having tight calf muscles.  Having high arches in your feet.  Starting a new athletic activity.  What are the signs or symptoms? The main symptom of this condition is heel pain. Other symptoms include:  Pain that gets worse after activity or exercise.  Pain that is worse in the morning or after resting.  Pain that goes away after you walk for a few minutes.  How is this diagnosed? This condition may be diagnosed based on your signs and symptoms. Your health care provider will also do a physical exam to check for:  A tender area on the bottom of your foot.  A high arch in your foot.  Pain when you move your foot.  Difficulty moving your foot.  You may also need to have imaging studies to confirm the diagnosis. These can include:  X-rays.  Ultrasound.  MRI.  How is this treated? Treatment for plantar fasciitis depends on the severity of the condition. Your treatment may include:  Rest, ice, and over-the-counter pain medicines to manage your pain.  Exercises to stretch your calves and your plantar fascia.  A splint that holds your foot in a stretched, upward position while you sleep (night  splint).  Physical therapy to relieve symptoms and prevent problems in the future.  Cortisone injections to relieve severe pain.  Extracorporeal shock wave therapy (ESWT) to stimulate damaged plantar fascia with electrical impulses. It is often used as a last resort before surgery.  Surgery, if other treatments have not worked after 12 months.  Follow these instructions at home:  Take medicines only as directed by your health care provider.  Avoid activities that cause pain.  Roll the bottom of your foot over a bag of ice or a bottle of cold water. Do this for 20 minutes, 3-4 times a day.  Perform simple stretches as directed by your health care provider.  Try wearing athletic shoes with air-sole or gel-sole cushions or soft shoe inserts.  Wear a night splint while sleeping, if directed by your health care provider.  Keep all follow-up appointments with your health care provider. How is this prevented?  Do not perform exercises or activities that cause heel pain.  Consider finding low-impact activities if you continue to have problems.  Lose weight if you need to. The best way to prevent plantar fasciitis is to avoid the activities that aggravate your plantar fascia. Contact a health care provider if:  Your symptoms do not go away after treatment with home care measures.  Your pain gets worse.  Your pain affects your ability to move or do your daily activities. This information is not intended to replace advice given to you by your health care provider. Make sure you   discuss any questions you have with your health care provider. Document Released: 06/10/2001 Document Revised: 02/18/2016 Document Reviewed: 07/26/2014 Elsevier Interactive Patient Education  2018 [employer]   Plantar Fasciitis Rehab Ask your health care provider which exercises are safe for you. Do exercises exactly as told by your health care provider and adjust them as directed. It is normal to feel  mild stretching, pulling, tightness, or discomfort as you do these exercises, but you should stop right away if you feel sudden pain or your pain gets worse. Do not begin these exercises until told by your health care provider. Stretching and range of motion exercises These exercises warm up your muscles and joints and improve the movement and flexibility of your foot. These exercises also help to relieve pain. Exercise A: Plantar fascia stretch  1. Sit with your left / right leg crossed over your opposite knee. 2. Hold your heel with one hand with that thumb near your arch. With your other hand, hold your toes and gently pull them back toward the top of your foot. You should feel a stretch on the bottom of your toes or your foot or both. 3. Hold this stretch for__________ seconds. 4. Slowly release your toes and return to the starting position. Repeat __________ times. Complete this exercise __________ times a day. Exercise B: Gastroc, standing  1. Stand with your hands against a wall. 2. Extend your left / right leg behind you, and bend your front knee slightly. 3. Keeping your heels on the floor and keeping your back knee straight, shift your weight toward the wall without arching your back. You should feel a gentle stretch in your left / right calf. 4. Hold this position for __________ seconds. Repeat __________ times. Complete this exercise __________ times a day. Exercise C: Soleus, standing 1. Stand with your hands against a wall. 2. Extend your left / right leg behind you, and bend your front knee slightly. 3. Keeping your heels on the floor, bend your back knee and slightly shift your weight over the back leg. You should feel a gentle stretch deep in your calf. 4. Hold this position for __________ seconds. Repeat __________ times. Complete this exercise __________ times a day. Exercise D: Gastrocsoleus, standing 1. Stand with the ball of your left / right foot on a step. The ball of  your foot is on the walking surface, right under your toes. 2. Keep your other foot firmly on the same step. 3. Hold onto the wall or a railing for balance. 4. Slowly lift your other foot, allowing your body weight to press your heel down over the edge of the step. You should feel a stretch in your left / right calf. 5. Hold this position for __________ seconds. 6. Return both feet to the step. 7. Repeat this exercise with a slight bend in your left / right knee. Repeat __________ times with your left / right knee straight and __________ times with your left / right knee bent. Complete this exercise __________ times a day. Balance exercise This exercise builds your balance and strength control of your arch to help take pressure off your plantar fascia. Exercise E: Single leg stand 1. Without shoes, stand near a railing or in a doorway. You may hold onto the railing or door frame as needed. 2. Stand on your left / right foot. Keep your big toe down on the floor and try to keep your arch lifted. Do not let your foot roll inward. 3. Hold   this position for __________ seconds. 4. If this exercise is too easy, you can try it with your eyes closed or while standing on a pillow. Repeat __________ times. Complete this exercise __________ times a day. This information is not intended to replace advice given to you by your health care provider. Make sure you discuss any questions you have with your health care provider. Document Released: 09/15/2005 Document Revised: 05/20/2016 Document Reviewed: 07/30/2015 Elsevier Interactive Patient Education  2018 Elsevier Inc.   

## 2017-04-27 NOTE — Progress Notes (Signed)
Subjective:    Patient ID: Madison Tapia, female    DOB: 01/26/1971, 46 y.o.   MRN: [id number]  HPI  46 year old patient who has a history of ADHD and morbid obesity.  She presents with a six-day history of right heel pain.  This has intensified over the past 2 days.  This started about 1 week ago after prolonged walking at a concert with her son  . Past Medical History:  Diagnosis Date  . ADD 07/14/2007  . ANXIETY 06/24/2007  . COMMON MIGRAINE 06/24/2007  . DEPRESSION 06/24/2007  . Headache(784.0) 10/08/2007  . HX, URINARY INFECTION 06/24/2007  . IRRITABLE BOWEL SYNDROME, HX OF 06/24/2007  . MALAISE AND FATIGUE 06/24/2007  . 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 Unspecified hypothyroidism 07/14/2007     Social History   Social History  . Marital status: Married    Spouse name: N/A  . Number of children: N/A  . Years of education: N/A   Occupational History  . Not on file.   Social History Main Topics  . Smoking status: Never Smoker  . Smokeless tobacco: Never Used  . Alcohol use Yes  . Drug use: No  . Sexual activity: Not on file   Other Topics Concern  . Not on file   Social History Narrative  . No narrative on file    No past surgical history on file.  Family History  Problem Relation Age of Onset  . Cancer Mother   . Thyroid disease Mother   . Breast cancer Mother   . Diabetes Father   . Hypertension Father   . Mental illness Father   . Depression Neg Hx        family hx    Allergies  Allergen Reactions  . Sulfonamide Derivatives     Current Outpatient Prescriptions on File Prior to Visit  Medication Sig Dispense Refill  . levothyroxine (SYNTHROID, LEVOTHROID) 200 MCG tablet TAKE ONE TABLET BY MOUTH ONCE DAILY BEFORE BREAKFAST 90 tablet 1   No current facility-administered medications on file prior to visit.     BP (!) 144/80 (BP Location: Left Arm, Patient Position: Sitting, Cuff Size: Normal)   Pulse 98   Temp 98.6 F (37 C) (Oral)    Ht 5\' 6"  (1.676 m)   Wt 296 lb 3.2 oz (134.4 kg)   SpO2 98%   BMI 47.81 kg/m     Review of Systems  Constitutional: Negative.   HENT: Negative for congestion, dental problem, hearing loss, rhinorrhea, sinus pressure, sore throat and tinnitus.   Eyes: Negative for pain, discharge and visual disturbance.  Respiratory: Negative for cough and shortness of breath.   Cardiovascular: Negative for chest pain, palpitations and leg swelling.  Gastrointestinal: Negative for abdominal distention, abdominal pain, blood in stool, constipation, diarrhea, nausea and vomiting.  Genitourinary: Negative for difficulty urinating, dysuria, flank pain, frequency, hematuria, pelvic pain, urgency, vaginal bleeding, vaginal discharge and vaginal pain.  Musculoskeletal: Positive for gait problem. Negative for arthralgias and joint swelling.       Right heel pain  Skin: Negative for rash.  Neurological: Negative for dizziness, syncope, speech difficulty, weakness, numbness and headaches.  Hematological: Negative for adenopathy.  Psychiatric/Behavioral: Negative for agitation, behavioral problems and dysphoric mood. The patient is not nervous/anxious.        Objective:   Physical Exam  Constitutional: She appears well-developed and well-nourished. No distress.  Weight 296  Musculoskeletal:  Tenderness over the right heel area.  Most marked medially No pain  over the Achilles tendon No inflammatory changes          Assessment & Plan:   Right heel pain.  Probable plantar fasciitis. We'll treat with some meloxicam. Proper foot wear.  Discussed  We'll call if unimproved Patient instructions for rehabilitation/stretching exercises, dispensed  Madison Tapia

## 2017-06-03 ENCOUNTER — Other Ambulatory Visit: Payer: Self-pay | Admitting: Internal Medicine

## 2017-06-18 ENCOUNTER — Encounter: Payer: Self-pay | Admitting: Internal Medicine

## 2017-08-26 DIAGNOSIS — Z85820 Personal history of malignant melanoma of skin: Secondary | ICD-10-CM | POA: Diagnosis not present

## 2017-08-26 DIAGNOSIS — L814 Other melanin hyperpigmentation: Secondary | ICD-10-CM | POA: Diagnosis not present

## 2017-08-26 DIAGNOSIS — D485 Neoplasm of uncertain behavior of skin: Secondary | ICD-10-CM | POA: Diagnosis not present

## 2017-08-26 DIAGNOSIS — D1801 Hemangioma of skin and subcutaneous tissue: Secondary | ICD-10-CM | POA: Diagnosis not present

## 2017-09-10 ENCOUNTER — Ambulatory Visit: Payer: BLUE CROSS/BLUE SHIELD | Admitting: Internal Medicine

## 2017-09-10 ENCOUNTER — Other Ambulatory Visit: Payer: Self-pay | Admitting: Internal Medicine

## 2017-09-10 ENCOUNTER — Encounter: Payer: Self-pay | Admitting: Internal Medicine

## 2017-09-10 VITALS — BP 134/82 | HR 84 | Temp 98.5°F | Ht 66.0 in | Wt 295.8 lb

## 2017-09-10 DIAGNOSIS — N921 Excessive and frequent menstruation with irregular cycle: Secondary | ICD-10-CM

## 2017-09-10 DIAGNOSIS — Z0000 Encounter for general adult medical examination without abnormal findings: Secondary | ICD-10-CM

## 2017-09-10 DIAGNOSIS — Z139 Encounter for screening, unspecified: Secondary | ICD-10-CM

## 2017-09-10 DIAGNOSIS — Z23 Encounter for immunization: Secondary | ICD-10-CM

## 2017-09-10 MED ORDER — AMPHETAMINE-DEXTROAMPHETAMINE 10 MG PO TABS: 10.0000 mg | ORAL_TABLET | Freq: Every day | ORAL | 0 refills | Status: DC | PRN

## 2017-09-10 MED ORDER — MELOXICAM 15 MG PO TABS: 15.0000 mg | ORAL_TABLET | Freq: Every day | ORAL | 0 refills | Status: DC

## 2017-09-10 MED ORDER — AMPHETAMINE-DEXTROAMPHETAMINE 20 MG PO TABS: 20.0000 mg | ORAL_TABLET | Freq: Every day | ORAL | 0 refills | Status: DC

## 2017-09-10 MED ORDER — LEVOTHYROXINE SODIUM 200 MCG PO TABS: ORAL_TABLET | ORAL | 1 refills | Status: DC

## 2017-09-10 NOTE — Progress Notes (Signed)
Subjective:    Patient ID: Madison Tapia, female    DOB: [birth year], 46 y.o.   MRN: [id number]  HPI  46 year old patient who is seen today for a preventive health examination She has a history of ADHD which has been managed with Adderall. She has hypothyroidism and remains on supplemental levothyroxine.  She has exogenous obesity and a history of migraine headaches.  She had a pelvic and Pap smear performed last year.  For the past 3 months she has had daily vaginal bleeding sometimes are quite heavy and sometimes only slight spotting.  She has had a recent dermatologic evaluation.  She does have annual mammograms   \ Past Medical History:  Diagnosis Date  . ADD 07/14/2007  . ANXIETY 06/24/2007  . COMMON MIGRAINE 06/24/2007  . DEPRESSION 06/24/2007  . Headache(784.0) 10/08/2007  . HX, URINARY INFECTION 06/24/2007  . IRRITABLE BOWEL SYNDROME, HX OF 06/24/2007  . MALAISE AND FATIGUE 06/24/2007  . 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 Unspecified hypothyroidism 07/14/2007     Social History   Socioeconomic History  . Marital status: Married    Spouse name: Not on file  . Number of children: Not on file  . Years of education: Not on file  . Highest education level: Not on file  Social Needs  . Financial resource strain: Not on file  . Food insecurity - worry: Not on file  . Food insecurity - inability: Not on file  . Transportation needs - medical: Not on file  . Transportation needs - non-medical: Not on file  Occupational History  . Not on file  Tobacco Use  . Smoking status: Never Smoker  . Smokeless tobacco: Never Used  Substance and Sexual Activity  . Alcohol use: Yes  . Drug use: No  . Sexual activity: Not on file  Other Topics Concern  . Not on file  Social History Narrative  . Not on file    History reviewed. No pertinent surgical history.  Family History  Problem Relation Age of Onset  . Cancer Mother   . Thyroid disease Mother   . Breast cancer  Mother   . Diabetes Father   . Hypertension Father   . Mental illness Father   . Depression Neg Hx        family hx    Allergies  Allergen Reactions  . Sulfonamide Derivatives     No current outpatient medications on file prior to visit.   No current facility-administered medications on file prior to visit.     BP 134/82 (BP Location: Left Arm, Patient Position: Sitting, Cuff Size: Large)   Pulse 84   Temp 98.5 F (36.9 C) (Oral)   Ht 5\' 6"  (1.676 m)   Wt 295 lb 12.8 oz (134.2 kg)   SpO2 99%   BMI 47.74 kg/m     Review of Systems  Constitutional: Negative.   HENT: Negative for congestion, dental problem, hearing loss, rhinorrhea, sinus pressure, sore throat and tinnitus.   Eyes: Negative for pain, discharge and visual disturbance.  Respiratory: Negative for cough and shortness of breath.   Cardiovascular: Negative for chest pain, palpitations and leg swelling.  Gastrointestinal: Negative for abdominal distention, abdominal pain, blood in stool, constipation, diarrhea, nausea and vomiting.  Genitourinary: Positive for vaginal bleeding. Negative for difficulty urinating, dysuria, flank pain, frequency, hematuria, pelvic pain, urgency, vaginal discharge and vaginal pain.  Musculoskeletal: Negative for arthralgias, gait problem and joint swelling.  Skin: Negative for rash.  Neurological: Negative for  dizziness, syncope, speech difficulty, weakness, numbness and headaches.  Hematological: Negative for adenopathy.  Psychiatric/Behavioral: Negative for agitation, behavioral problems and dysphoric mood. The patient is not nervous/anxious.        Objective:   Physical Exam  Constitutional: She is oriented to person, place, and time. She appears well-developed and well-nourished.  HENT:  Head: Normocephalic.  Right Ear: External ear normal.  Left Ear: External ear normal.  Mouth/Throat: Oropharynx is clear and moist.  Eyes: Conjunctivae and EOM are normal. Pupils are  equal, round, and reactive to light.  Neck: Normal range of motion. Neck supple. No thyromegaly present.  Cardiovascular: Normal rate, regular rhythm, normal heart sounds and intact distal pulses.  Pulmonary/Chest: Effort normal and breath sounds normal.  Scar right breast  Abdominal: Soft. Bowel sounds are normal. She exhibits no mass. There is no tenderness.  Musculoskeletal: Normal range of motion.  Lymphadenopathy:    She has no cervical adenopathy.  Neurological: She is alert and oriented to person, place, and time.  Skin: Skin is warm and dry. No rash noted.  Psychiatric: She has a normal mood and affect. Her behavior is normal.          Assessment & Plan:   Preventive health examination.  Flu vaccine administered.  Will check screening lab Menomenorrhagia.  We will set up for gynecologic evaluation  Weight loss encouraged Gynecologic referral Medications updated  Nyoka Cowden

## 2017-09-10 NOTE — Patient Instructions (Signed)
Gynecology evaluation as discussed    It is important that you exercise regularly, at least 20 minutes 3 to 4 times per week.  If you develop chest pain or shortness of breath seek  medical attention.  You need to lose weight.  Consider a lower calorie diet and regular exercise.  Annual mammogram as scheduled

## 2017-09-11 LAB — HEPATIC FUNCTION PANEL
ALBUMIN: 4 g/dL (ref 3.5–5.2)
ALK PHOS: 72 U/L (ref 39–117)
ALT: 12 U/L (ref 0–35)
AST: 15 U/L (ref 0–37)
BILIRUBIN DIRECT: 0.2 mg/dL (ref 0.0–0.3)
TOTAL PROTEIN: 7.1 g/dL (ref 6.0–8.3)
Total Bilirubin: 0.9 mg/dL (ref 0.2–1.2)

## 2017-09-11 LAB — BASIC METABOLIC PANEL
BUN: 12 mg/dL (ref 6–23)
CHLORIDE: 100 meq/L (ref 96–112)
CO2: 25 meq/L (ref 19–32)
CREATININE: 0.83 mg/dL (ref 0.40–1.20)
Calcium: 9 mg/dL (ref 8.4–10.5)
GFR: 78.63 mL/min (ref >60.00)
GLUCOSE: 74 mg/dL (ref 70–99)
POTASSIUM: 4 meq/L (ref 3.5–5.1)
Sodium: 135 mEq/L (ref 135–145)

## 2017-09-11 LAB — CBC WITH DIFFERENTIAL/PLATELET
BASOS PCT: 0.7 % (ref 0.0–3.0)
Basophils Absolute: 0.1 10*3/uL (ref 0.0–0.1)
EOS ABS: 0.1 10*3/uL (ref 0.0–0.7)
EOS PCT: 1.2 % (ref 0.0–5.0)
HCT: 36.5 % (ref 36.0–46.0)
HEMOGLOBIN: 11.5 g/dL — AB (ref 12.0–15.0)
LYMPHS ABS: 2.6 10*3/uL (ref 0.7–4.0)
Lymphocytes Relative: 25.8 % (ref 12.0–46.0)
MCHC: 31.6 g/dL (ref 30.0–36.0)
MCV: 76.4 fl — ABNORMAL LOW (ref 78.0–100.0)
MONO ABS: 0.6 10*3/uL (ref 0.1–1.0)
Monocytes Relative: 6.4 % (ref 3.0–12.0)
NEUTROS ABS: 6.5 10*3/uL (ref 1.4–7.7)
Neutrophils Relative %: 65.9 % (ref 43.0–77.0)
PLATELETS: 313 10*3/uL (ref 150.0–400.0)
RBC: 4.78 Mil/uL (ref 3.87–5.11)
RDW: 16.5 % — AB (ref 11.5–15.5)
WBC: 9.9 10*3/uL (ref 4.0–10.5)

## 2017-09-11 LAB — LIPID PANEL
Cholesterol: 185 mg/dL (ref 0–200)
HDL: 55.8 mg/dL (ref >39.00)
NONHDL: 128.96
Total CHOL/HDL Ratio: 3
Triglycerides: 266 mg/dL — ABNORMAL HIGH (ref 0.0–149.0)
VLDL: 53.2 mg/dL — AB (ref 0.0–40.0)

## 2017-09-11 LAB — LDL CHOLESTEROL, DIRECT: LDL DIRECT: 117 mg/dL

## 2017-09-11 LAB — TSH: TSH: 0.84 u[IU]/mL (ref 0.35–4.50)

## 2017-11-04 ENCOUNTER — Ambulatory Visit: Payer: BLUE CROSS/BLUE SHIELD | Admitting: Obstetrics & Gynecology

## 2017-11-16 ENCOUNTER — Ambulatory Visit
Admission: RE | Admit: 2017-11-16 | Discharge: 2017-11-16 | Disposition: A | Discharge status: Home / Self Care | Payer: BLUE CROSS/BLUE SHIELD | Source: Ambulatory Visit | Attending: Internal Medicine | Admitting: Internal Medicine

## 2017-11-16 DIAGNOSIS — Z139 Encounter for screening, unspecified: Secondary | ICD-10-CM

## 2017-11-16 DIAGNOSIS — Z1231 Encounter for screening mammogram for malignant neoplasm of breast: Secondary | ICD-10-CM | POA: Diagnosis not present

## 2017-12-02 ENCOUNTER — Telehealth: Payer: Self-pay | Admitting: Internal Medicine

## 2017-12-02 DIAGNOSIS — Z0000 Encounter for general adult medical examination without abnormal findings: Secondary | ICD-10-CM

## 2017-12-07 MED ORDER — LEVOTHYROXINE SODIUM 200 MCG PO TABS: ORAL_TABLET | ORAL | 1 refills | Status: DC

## 2017-12-07 NOTE — Telephone Encounter (Signed)
Electronic request for levothyroxine has not been received.  Medication filled to pharmacy as requested.  Called patient and notified her that med was filled, and that she should have remaining refill on script that was sent to [facility] on 09/10/2017. Patient states she no longer uses [facility], so I removed this pharmacy from her chart as requested.

## 2017-12-07 NOTE — Telephone Encounter (Signed)
Relation to pt: self Call back [place]: [place], [place] [phone] (Phone) [phone] (Fax)     Reason for call:  Patient checking on the status of levothyroxine (SYNTHROID, LEVOTHROID) 200 MCG tablet. Pharmacy sent request electronically on 12/02/17, patient completely out and would like Rx sent today, please advise

## 2018-03-03 ENCOUNTER — Telehealth: Payer: Self-pay | Admitting: Internal Medicine

## 2018-03-03 ENCOUNTER — Other Ambulatory Visit: Payer: Self-pay

## 2018-03-03 DIAGNOSIS — Z0000 Encounter for general adult medical examination without abnormal findings: Secondary | ICD-10-CM

## 2018-03-03 MED ORDER — AMPHETAMINE-DEXTROAMPHETAMINE 10 MG PO TABS: 10.0000 mg | ORAL_TABLET | Freq: Every day | ORAL | 0 refills | Status: DC | PRN

## 2018-03-03 NOTE — Telephone Encounter (Signed)
Copied from Madison [id number]. Topic: Quick Communication - See Telephone Encounter >> Mar 03, 2018 12:52 PM Ether Griffins B wrote: CRM for notification. See Telephone encounter for: 03/03/18.  Pt requesting refills on amphetamine-dextroamphetamine (ADDERALL) 10 MG tablet  & amphetamine-dextroamphetamine (ADDERALL) 20 MG tablet

## 2018-03-03 NOTE — Telephone Encounter (Signed)
Okay to refill? Please advise.  

## 2018-03-03 NOTE — Telephone Encounter (Signed)
Okay for refill?  

## 2018-03-05 ENCOUNTER — Other Ambulatory Visit: Payer: Self-pay

## 2018-03-05 NOTE — Telephone Encounter (Signed)
Pt was notified that Rx was ready for pick up.

## 2018-03-16 ENCOUNTER — Other Ambulatory Visit: Payer: Self-pay

## 2018-03-16 DIAGNOSIS — Z0000 Encounter for general adult medical examination without abnormal findings: Secondary | ICD-10-CM

## 2018-03-16 MED ORDER — AMPHETAMINE-DEXTROAMPHETAMINE 20 MG PO TABS: 20.0000 mg | ORAL_TABLET | Freq: Every day | ORAL | 0 refills | Status: DC

## 2018-03-16 NOTE — Telephone Encounter (Signed)
Tried to call pt to tpick up Rx. Pt was just in the office and I missed her. Pt vm is not set up. Will give pt Rx at [facility] 03/17/2018.

## 2018-03-17 ENCOUNTER — Encounter: Payer: Self-pay | Admitting: Internal Medicine

## 2018-03-17 ENCOUNTER — Ambulatory Visit: Payer: BLUE CROSS/BLUE SHIELD | Admitting: Internal Medicine

## 2018-03-17 VITALS — BP 110/80 | Temp 98.8°F | Wt 300.0 lb

## 2018-03-17 DIAGNOSIS — E669 Obesity, unspecified: Secondary | ICD-10-CM

## 2018-03-17 DIAGNOSIS — F900 Attention-deficit hyperactivity disorder, predominantly inattentive type: Secondary | ICD-10-CM

## 2018-03-17 DIAGNOSIS — E039 Hypothyroidism, unspecified: Secondary | ICD-10-CM

## 2018-03-17 DIAGNOSIS — Z0000 Encounter for general adult medical examination without abnormal findings: Secondary | ICD-10-CM

## 2018-03-17 MED ORDER — LEVOTHYROXINE SODIUM 200 MCG PO TABS: ORAL_TABLET | ORAL | 1 refills | Status: DC

## 2018-03-17 NOTE — Patient Instructions (Signed)
Return in approximately 6 months to establish with a new provider

## 2018-03-17 NOTE — Progress Notes (Signed)
Subjective:    Patient ID: Madison Tapia, female    DOB: 1971/06/12, 47 y.o.   MRN: [id number]  HPI  47 year old patient history of obesity as well as ADHD.  She is seen today for her 45-month follow-up.  She is doing quite well.  A bit discouraged about her fluctuating weight.  Earlier in the fall lost down to 280 but has resumed 20 pounds more recently.  She has hypothyroidism. She feels the Adderall continues to do quite well and has no tolerability issues denies any insomnia or nervousness  Past Medical History:  Diagnosis Date  . ADD 07/14/2007  . ANXIETY 06/24/2007  . COMMON MIGRAINE 06/24/2007  . DEPRESSION 06/24/2007  . Headache(784.0) 10/08/2007  . HX, URINARY INFECTION 06/24/2007  . IRRITABLE BOWEL SYNDROME, HX OF 06/24/2007  . MALAISE AND FATIGUE 06/24/2007  . 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 Unspecified hypothyroidism 07/14/2007     Social History   Socioeconomic History  . Marital status: Married    Spouse name: Not on file  . Number of children: Not on file  . Years of education: Not on file  . Highest education level: Not on file  Occupational History  . Not on file  Social Needs  . Financial resource strain: Not on file  . Food insecurity:    Worry: Not on file    Inability: Not on file  . Transportation needs:    Medical: Not on file    Non-medical: Not on file  Tobacco Use  . Smoking status: Never Smoker  . Smokeless tobacco: Never Used  Substance and Sexual Activity  . Alcohol use: Yes  . Drug use: No  . Sexual activity: Not on file  Lifestyle  . Physical activity:    Days per week: Not on file    Minutes per session: Not on file  . Stress: Not on file  Relationships  . Social connections:    Talks on phone: Not on file    Gets together: Not on file    Attends religious service: Not on file    Active member of club or organization: Not on file    Attends meetings of clubs or organizations: Not on file    Relationship status: Not on  file  . Intimate partner violence:    Fear of current or ex partner: Not on file    Emotionally abused: Not on file    Physically abused: Not on file    Forced sexual activity: Not on file  Other Topics Concern  . Not on file  Social History Narrative  . Not on file    History reviewed. No pertinent surgical history.  Family History  Problem Relation Age of Onset  . Cancer Mother   . Thyroid disease Mother   . Breast cancer Mother 3  . Diabetes Father   . Hypertension Father   . Mental illness Father   . Depression Neg Hx        family hx    Allergies  Allergen Reactions  . Sulfonamide Derivatives     Current Outpatient Medications on File Prior to Visit  Medication Sig Dispense Refill  . amphetamine-dextroamphetamine (ADDERALL) 10 MG tablet Take 1 tablet (10 mg total) by mouth daily as needed (In the afternoon if needed). 30 tablet 0  . amphetamine-dextroamphetamine (ADDERALL) 10 MG tablet Take 1 tablet (10 mg total) by mouth daily as needed (In the afternoon if needed). 30 tablet 0  . amphetamine-dextroamphetamine (ADDERALL) 10 MG  tablet Take 1 tablet (10 mg total) by mouth daily as needed. In the afternoon if needed 30 tablet 0  . amphetamine-dextroamphetamine (ADDERALL) 20 MG tablet Take 1 tablet (20 mg total) by mouth daily. 30 tablet 0  . amphetamine-dextroamphetamine (ADDERALL) 20 MG tablet Take 1 tablet (20 mg total) by mouth daily. 30 tablet 0  . amphetamine-dextroamphetamine (ADDERALL) 20 MG tablet Take 1 tablet (20 mg total) by mouth daily. 30 tablet 0  . meloxicam (MOBIC) 15 MG tablet Take 1 tablet (15 mg total) by mouth daily. 30 tablet 0   No current facility-administered medications on file prior to visit.     BP 110/80 (BP Location: Right Arm, Patient Position: Sitting, Cuff Size: Large)   Temp 98.8 F (37.1 C) (Oral)   Wt 300 lb (136.1 kg)   BMI 48.42 kg/m     Review of Systems  Constitutional: Negative.   HENT: Negative for congestion, dental  problem, hearing loss, rhinorrhea, sinus pressure, sore throat and tinnitus.   Eyes: Negative for pain, discharge and visual disturbance.  Respiratory: Negative for cough and shortness of breath.   Cardiovascular: Negative for chest pain, palpitations and leg swelling.  Gastrointestinal: Negative for abdominal distention, abdominal pain, blood in stool, constipation, diarrhea, nausea and vomiting.  Genitourinary: Negative for difficulty urinating, dysuria, flank pain, frequency, hematuria, pelvic pain, urgency, vaginal bleeding, vaginal discharge and vaginal pain.  Musculoskeletal: Negative for arthralgias, gait problem and joint swelling.  Skin: Negative for rash.  Neurological: Negative for dizziness, syncope, speech difficulty, weakness, numbness and headaches.  Hematological: Negative for adenopathy.  Psychiatric/Behavioral: Negative for agitation, behavioral problems and dysphoric mood. The patient is not nervous/anxious.        Objective:   Physical Exam  Constitutional: She is oriented to person, place, and time. She appears well-developed and well-nourished.  HENT:  Head: Normocephalic.  Right Ear: External ear normal.  Left Ear: External ear normal.  Mouth/Throat: Oropharynx is clear and moist.  Eyes: Pupils are equal, round, and reactive to light. Conjunctivae and EOM are normal.  Neck: Normal range of motion. Neck supple. No thyromegaly present.  Cardiovascular: Normal rate, regular rhythm, normal heart sounds and intact distal pulses.  Pulmonary/Chest: Effort normal and breath sounds normal.  Abdominal: Soft. Bowel sounds are normal. She exhibits no mass. There is no tenderness.  Musculoskeletal: Normal range of motion.  Lymphadenopathy:    She has no cervical adenopathy.  Neurological: She is alert and oriented to person, place, and time.  Skin: Skin is warm and dry. No rash noted.  Psychiatric: She has a normal mood and affect. Her behavior is normal.            Assessment & Plan:   ADHD.  Adderall refilled Obesity continue efforts at weight loss.  More regular exercise encouraged Hypothyroidism.  Will review a TSH  Patient has been asked to return and establish with a new provider in 6 months  Marletta Lor

## 2018-03-23 DIAGNOSIS — D485 Neoplasm of uncertain behavior of skin: Secondary | ICD-10-CM | POA: Diagnosis not present

## 2018-06-29 DIAGNOSIS — F4312 Post-traumatic stress disorder, chronic: Secondary | ICD-10-CM | POA: Diagnosis not present

## 2018-07-06 DIAGNOSIS — F4312 Post-traumatic stress disorder, chronic: Secondary | ICD-10-CM | POA: Diagnosis not present

## 2018-07-13 DIAGNOSIS — F4312 Post-traumatic stress disorder, chronic: Secondary | ICD-10-CM | POA: Diagnosis not present

## 2018-07-20 DIAGNOSIS — F4312 Post-traumatic stress disorder, chronic: Secondary | ICD-10-CM | POA: Diagnosis not present

## 2018-07-27 DIAGNOSIS — F4312 Post-traumatic stress disorder, chronic: Secondary | ICD-10-CM | POA: Diagnosis not present

## 2018-08-03 DIAGNOSIS — F4312 Post-traumatic stress disorder, chronic: Secondary | ICD-10-CM | POA: Diagnosis not present

## 2018-08-09 ENCOUNTER — Telehealth: Payer: Self-pay | Admitting: Internal Medicine

## 2018-08-09 NOTE — Telephone Encounter (Signed)
Copied from [place] [id number]. Topic: Quick Communication - See Telephone Encounter >> Aug 09, 2018  2:15 PM Blase Mess A wrote: CRM for notification. See Telephone encounter for: 08/09/18.  Patient is calling regarding amphetamine-dextroamphetamine (ADDERALL) 20 MG tablet [[id number]] She has an appt with Dr. Jerilee Hoh on 09/01/18 Patient is requesting a refill to last her up until 09/11/18.  Patient was last in to see Dr. Raliegh Ip 03/17/18 [facility], [place], [place] - [address] [place] [address] Phone: [phone] Fax: [phone]

## 2018-08-10 DIAGNOSIS — F4312 Post-traumatic stress disorder, chronic: Secondary | ICD-10-CM | POA: Diagnosis not present

## 2018-08-11 ENCOUNTER — Other Ambulatory Visit: Payer: Self-pay | Admitting: Family Medicine

## 2018-08-11 DIAGNOSIS — Z0000 Encounter for general adult medical examination without abnormal findings: Secondary | ICD-10-CM

## 2018-08-11 MED ORDER — AMPHETAMINE-DEXTROAMPHETAMINE 10 MG PO TABS: 10.0000 mg | ORAL_TABLET | Freq: Every day | ORAL | 0 refills | Status: DC | PRN

## 2018-08-11 MED ORDER — AMPHETAMINE-DEXTROAMPHETAMINE 20 MG PO TABS: 20.0000 mg | ORAL_TABLET | Freq: Every day | ORAL | 0 refills | Status: DC

## 2018-08-11 NOTE — Telephone Encounter (Signed)
[facility] for refill?  Pt has an appt set up with Dr.Hernandez 09/01/2018  Please advise

## 2018-08-17 DIAGNOSIS — F4312 Post-traumatic stress disorder, chronic: Secondary | ICD-10-CM | POA: Diagnosis not present

## 2018-08-24 DIAGNOSIS — F4312 Post-traumatic stress disorder, chronic: Secondary | ICD-10-CM | POA: Diagnosis not present

## 2018-08-30 DIAGNOSIS — Z85820 Personal history of malignant melanoma of skin: Secondary | ICD-10-CM | POA: Diagnosis not present

## 2018-08-30 DIAGNOSIS — L821 Other seborrheic keratosis: Secondary | ICD-10-CM | POA: Diagnosis not present

## 2018-08-30 DIAGNOSIS — Z86018 Personal history of other benign neoplasm: Secondary | ICD-10-CM | POA: Diagnosis not present

## 2018-08-30 DIAGNOSIS — L814 Other melanin hyperpigmentation: Secondary | ICD-10-CM | POA: Diagnosis not present

## 2018-08-31 DIAGNOSIS — F4312 Post-traumatic stress disorder, chronic: Secondary | ICD-10-CM | POA: Diagnosis not present

## 2018-09-01 ENCOUNTER — Encounter: Payer: Self-pay | Admitting: Internal Medicine

## 2018-09-01 ENCOUNTER — Ambulatory Visit: Payer: BLUE CROSS/BLUE SHIELD | Admitting: Internal Medicine

## 2018-09-01 ENCOUNTER — Telehealth: Payer: Self-pay

## 2018-09-01 VITALS — BP 140/90 | HR 116 | Temp 99.1°F | Ht 67.0 in | Wt 309.2 lb

## 2018-09-01 DIAGNOSIS — E538 Deficiency of other specified B group vitamins: Secondary | ICD-10-CM | POA: Insufficient documentation

## 2018-09-01 DIAGNOSIS — R5383 Other fatigue: Secondary | ICD-10-CM

## 2018-09-01 DIAGNOSIS — F339 Major depressive disorder, recurrent, unspecified: Secondary | ICD-10-CM | POA: Diagnosis not present

## 2018-09-01 DIAGNOSIS — D509 Iron deficiency anemia, unspecified: Secondary | ICD-10-CM | POA: Insufficient documentation

## 2018-09-01 DIAGNOSIS — Z23 Encounter for immunization: Secondary | ICD-10-CM | POA: Diagnosis not present

## 2018-09-01 DIAGNOSIS — E039 Hypothyroidism, unspecified: Secondary | ICD-10-CM

## 2018-09-01 DIAGNOSIS — E559 Vitamin D deficiency, unspecified: Secondary | ICD-10-CM

## 2018-09-01 DIAGNOSIS — Z0000 Encounter for general adult medical examination without abnormal findings: Secondary | ICD-10-CM | POA: Diagnosis not present

## 2018-09-01 DIAGNOSIS — N921 Excessive and frequent menstruation with irregular cycle: Secondary | ICD-10-CM | POA: Diagnosis not present

## 2018-09-01 DIAGNOSIS — D500 Iron deficiency anemia secondary to blood loss (chronic): Secondary | ICD-10-CM

## 2018-09-01 LAB — COMPREHENSIVE METABOLIC PANEL
ALT: 15 U/L (ref 0–35)
AST: 15 U/L (ref 0–37)
Albumin: 4.2 g/dL (ref 3.5–5.2)
Alkaline Phosphatase: 81 U/L (ref 39–117)
BUN: 14 mg/dL (ref 6–23)
CO2: 23 mEq/L (ref 19–32)
Calcium: 9.1 mg/dL (ref 8.4–10.5)
Chloride: 107 mEq/L (ref 96–112)
Creatinine, Ser: 0.78 mg/dL (ref 0.40–1.20)
GFR: 84.12 mL/min (ref >60.00)
Glucose, Bld: 112 mg/dL — ABNORMAL HIGH (ref 70–99)
Potassium: 4.5 mEq/L (ref 3.5–5.1)
Sodium: 139 mEq/L (ref 135–145)
Total Bilirubin: 0.6 mg/dL (ref 0.2–1.2)
Total Protein: 7.4 g/dL (ref 6.0–8.3)

## 2018-09-01 LAB — VITAMIN D 25 HYDROXY (VIT D DEFICIENCY, FRACTURES): VITD: 12.7 ng/mL — AB (ref 30.00–100.00)

## 2018-09-01 LAB — CBC WITH DIFFERENTIAL/PLATELET
Basophils Absolute: 0.1 10*3/uL (ref 0.0–0.1)
Basophils Relative: 1.3 % (ref 0.0–3.0)
Eosinophils Absolute: 0.1 10*3/uL (ref 0.0–0.7)
Eosinophils Relative: 1.5 % (ref 0.0–5.0)
HCT: 26.9 % — ABNORMAL LOW (ref 36.0–46.0)
Hemoglobin: 7.8 g/dL — CL (ref 12.0–15.0)
Lymphocytes Relative: 24.1 % (ref 12.0–46.0)
Lymphs Abs: 1.9 10*3/uL (ref 0.7–4.0)
MCHC: 28.2 g/dL — ABNORMAL LOW (ref 30.0–36.0)
MCV: 59.1 fl — AB (ref 78.0–100.0)
Monocytes Absolute: 0.5 10*3/uL (ref 0.1–1.0)
Monocytes Relative: 6.9 % (ref 3.0–12.0)
Neutro Abs: 5.2 10*3/uL (ref 1.4–7.7)
Neutrophils Relative %: 66.2 % (ref 43.0–77.0)
Platelets: 293 10*3/uL (ref 150.0–400.0)
RBC: 4.68 Mil/uL (ref 3.87–5.11)
RDW: 20.8 % — ABNORMAL HIGH (ref 11.5–15.5)
WBC: 7.8 10*3/uL (ref 4.0–10.5)

## 2018-09-01 LAB — VITAMIN B12: Vitamin B-12: 189 pg/mL — ABNORMAL LOW (ref 211–911)

## 2018-09-01 LAB — LDL CHOLESTEROL, DIRECT: Direct LDL: 121 mg/dL

## 2018-09-01 LAB — LIPID PANEL
Cholesterol: 196 mg/dL (ref 0–200)
HDL: 55.7 mg/dL (ref >39.00)
NonHDL: 140.64
Total CHOL/HDL Ratio: 4
Triglycerides: 215 mg/dL — ABNORMAL HIGH (ref 0.0–149.0)
VLDL: 43 mg/dL — ABNORMAL HIGH (ref 0.0–40.0)

## 2018-09-01 LAB — TSH: TSH: 3.82 u[IU]/mL (ref 0.35–4.50)

## 2018-09-01 MED ORDER — VITAMIN D (ERGOCALCIFEROL) 1.25 MG (50000 UNIT) PO CAPS: 50000.0000 [IU] | ORAL_CAPSULE | ORAL | 0 refills | Status: AC

## 2018-09-01 MED ORDER — ESCITALOPRAM OXALATE 5 MG PO TABS: 5.0000 mg | ORAL_TABLET | Freq: Every day | ORAL | 2 refills | Status: DC

## 2018-09-01 NOTE — Progress Notes (Signed)
Established Patient Office Visit     CC/Reason for Visit: Establish care, follow-up of chronic medical issues, preventative health exam  HPI: Madison Tapia is a 47 y.o. female who is coming in today for the above mentioned reasons. Past Medical History is significant for: ADHD, hypothyroidism.  She has a multitude of complaints at today's visit: She has been gaining weight and is very frustrated about this.  She is very depressed despite seeing a behavioral health counselor every other week.  She has been on antidepressants in the past and wonders if it is not time to resume.  She also complains of menometrorrhagia.  Her periods last anywhere from 2 to 2-1/2 weeks.  On her heaviest days she uses half a box of tampons.  She has been very fatigued and short of breath even with minimal ambulation.  She admits to a lot of social stressors.   Past Medical/Surgical History: Past Medical History:  Diagnosis Date  . ADD 07/14/2007  . ANXIETY 06/24/2007  . COMMON MIGRAINE 06/24/2007  . DEPRESSION 06/24/2007  . Headache(784.0) 10/08/2007  . HX, URINARY INFECTION 06/24/2007  . IRRITABLE BOWEL SYNDROME, HX OF 06/24/2007  . MALAISE AND FATIGUE 06/24/2007  . 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 Unspecified hypothyroidism 07/14/2007    No past surgical history on file.  Social History:  reports that she has never smoked. She has never used smokeless tobacco. She reports that she drinks alcohol. She reports that she does not use drugs.  Allergies: Allergies  Allergen Reactions  . Sulfonamide Derivatives     Family History:  Family History  Problem Relation Age of Onset  . Cancer Mother   . Thyroid disease Mother   . Breast cancer Mother 45  . Diabetes Father   . Hypertension Father   . Mental illness Father   . Depression Neg Hx        family hx     Current Outpatient Medications:  .  amphetamine-dextroamphetamine (ADDERALL) 10 MG tablet, Take 1 tablet (10 mg total) by mouth  daily as needed (In the afternoon if needed)., Disp: 30 tablet, Rfl: 0 .  amphetamine-dextroamphetamine (ADDERALL) 10 MG tablet, Take 1 tablet (10 mg total) by mouth daily as needed. In the afternoon if needed, Disp: 30 tablet, Rfl: 0 .  amphetamine-dextroamphetamine (ADDERALL) 10 MG tablet, Take 1 tablet (10 mg total) by mouth daily as needed (In the afternoon if needed)., Disp: 30 tablet, Rfl: 0 .  amphetamine-dextroamphetamine (ADDERALL) 20 MG tablet, Take 1 tablet (20 mg total) by mouth daily., Disp: 30 tablet, Rfl: 0 .  amphetamine-dextroamphetamine (ADDERALL) 20 MG tablet, Take 1 tablet (20 mg total) by mouth daily., Disp: 30 tablet, Rfl: 0 .  amphetamine-dextroamphetamine (ADDERALL) 20 MG tablet, Take 1 tablet (20 mg total) by mouth daily., Disp: 30 tablet, Rfl: 0 .  levothyroxine (SYNTHROID, LEVOTHROID) 200 MCG tablet, TAKE 1 TABLET BY MOUTH ONCE DAILY BEFORE BREAKFAST, Disp: 90 tablet, Rfl: 1 .  escitalopram (LEXAPRO) 5 MG tablet, Take 1 tablet (5 mg total) by mouth daily., Disp: 30 tablet, Rfl: 2  Review of Systems:  Constitutional: Denies fever, chills, diaphoresis, appetite change. HEENT: Denies photophobia, eye pain, redness, hearing loss, ear pain, congestion, sore throat, rhinorrhea, sneezing, mouth sores, trouble swallowing, neck pain, neck stiffness and tinnitus.   Respiratory: Denies SOB, DOE, cough, chest tightness,  and wheezing.   Cardiovascular: Denies chest pain, palpitations and leg swelling.  Gastrointestinal: Denies nausea, vomiting, abdominal pain, diarrhea, constipation, blood in stool  and abdominal distention.  Genitourinary: Denies dysuria, urgency, frequency, hematuria, flank pain and difficulty urinating.  Endocrine: Denies: hot or cold intolerance, sweats, changes in hair or nails, polyuria, polydipsia. Musculoskeletal: Denies myalgias, back pain, joint swelling, arthralgias and gait problem.  Skin: Denies pallor, rash and wound.  Neurological: Denies dizziness,  seizures, syncope, weakness, light-headedness, numbness and headaches.  Hematological: Denies adenopathy. Easy bruising, personal or family bleeding history  Psychiatric/Behavioral: Denies suicidal ideation, feels depressed, decreased sleep.    Physical Exam: Vitals:   09/01/18 0741  BP: 140/90  Pulse: (!) 116  Temp: 99.1 F (37.3 C)  TempSrc: Oral  SpO2: 98%  Weight: (!) 309 lb 3.7 oz (140.3 kg)  Height: 5' 7"  (1.702 m)    Body mass index is 48.43 kg/m.   Constitutional: NAD, calm, comfortable Eyes: PERRL, lids and conjunctivae normal ENMT: Mucous membranes are moist. Posterior pharynx clear of any exudate or lesions. Normal dentition. Tympanic membrane is pearly white, no erythema or bulging. Neck: normal, supple, no masses, no thyromegaly Respiratory: clear to auscultation bilaterally, no wheezing, no crackles. Normal respiratory effort. No accessory muscle use.  Cardiovascular: Regular rate and rhythm, no murmurs / rubs / gallops. No extremity edema. 2+ pedal pulses. No carotid bruits.  Abdomen: no tenderness, no masses palpated. No hepatosplenomegaly. Bowel sounds positive.  Musculoskeletal: no clubbing / cyanosis. No joint deformity upper and lower extremities. Good ROM, no contractures. Normal muscle tone.  Skin: no rashes, lesions, ulcers. No induration Neurologic: CN 2-12 grossly intact. Sensation intact, DTR normal. Strength 5/5 in all 4.  Psychiatric: Normal judgment and insight. Alert and oriented x 3. Normal mood.    Impression and Plan:  Acquired hypothyroidism  -Check TSH today, with her fatigue 1 to make sure that she is not hypothyroid.  Morbid obesity ([place]), Chronic -We have extensively discussed lifestyle modification. -She would like information on the [facility].  Metrorrhagia -Have recommended she see GYN as soon as possible. -We will check CBC today to make sure she is not anemic as this may be contributing to her  fatigue.  Depression, recurrent ([place]) -PHQ 9 score of 10.  She is visibly tearful on exam today. -Discussed initiation of antidepressant, her behavioral health counselor recommended that she discuss that with me today. -We will start low-dose Lexapro 5 mg daily and we will plan to see her back in 3 months.  Visit for preventive health examination  -Labs requested at today's visit including vitamin B12 vitamin D, CBC, CMP, lipids, TSH, HIV, flu shot administered today.  Fatigue, unspecified type -Suspect due to depression, however given her metrorrhagia and her history of hypothyroidism we will check CBC and TSH.     Patient Instructions  -It was nice meeting you today.  -We will send out referral to [facility].  -Make sure you schedule follow-up with GYN.  -We will start Lexapro 5 mg daily.  -We will let you know results of blood work.  -Please schedule follow-up with me in 3 months.   Preventive Care 40-64 Years, Female Preventive care refers to lifestyle choices and visits with your health care provider that can promote health and wellness. What does preventive care include?  A yearly physical exam. This is also called an annual well check.  Dental exams once or twice a year.  Routine eye exams. Ask your health care provider how often you should have your eyes checked.  Personal lifestyle choices, including: ? Daily care of  your teeth and gums. ? Regular physical activity. ? Eating a healthy diet. ? Avoiding tobacco and drug use. ? Limiting alcohol use. ? Practicing safe sex. ? Taking low-dose aspirin daily starting at age 23. ? Taking vitamin and mineral supplements as recommended by your health care provider. What happens during an annual well check? The services and screenings done by your health care provider during your annual well check will depend on your age, overall health, lifestyle risk factors, and family history of  disease. Counseling Your health care provider may ask you questions about your:  Alcohol use.  Tobacco use.  Drug use.  Emotional well-being.  Home and relationship well-being.  Sexual activity.  Eating habits.  Work and work Statistician.  Method of birth control.  Menstrual cycle.  Pregnancy history.  Screening You may have the following tests or measurements:  Height, weight, and BMI.  Blood pressure.  Lipid and cholesterol levels. These may be checked every 5 years, or more frequently if you are over 71 years old.  Skin check.  Lung cancer screening. You may have this screening every year starting at age 37 if you have a 30-pack-year history of smoking and currently smoke or have quit within the past 15 years.  Fecal occult blood test (FOBT) of the stool. You may have this test every year starting at age 15.  Flexible sigmoidoscopy or colonoscopy. You may have a sigmoidoscopy every 5 years or a colonoscopy every 10 years starting at age 18.  Hepatitis C blood test.  Hepatitis B blood test.  Sexually transmitted disease (STD) testing.  Diabetes screening. This is done by checking your blood sugar (glucose) after you have not eaten for a while (fasting). You may have this done every 1-3 years.  Mammogram. This may be done every 1-2 years. Talk to your health care provider about when you should start having regular mammograms. This may depend on whether you have a family history of breast cancer.  BRCA-related cancer screening. This may be done if you have a family history of breast, ovarian, tubal, or peritoneal cancers.  Pelvic exam and Pap test. This may be done every 3 years starting at age 32. Starting at age 33, this may be done every 5 years if you have a Pap test in combination with an HPV test.  Bone density scan. This is done to screen for osteoporosis. You may have this scan if you are at high risk for osteoporosis.  Discuss your test results,  treatment options, and if necessary, the need for more tests with your health care provider. Vaccines Your health care provider may recommend certain vaccines, such as:  Influenza vaccine. This is recommended every year.  Tetanus, diphtheria, and acellular pertussis (Tdap, Td) vaccine. You may need a Td booster every 10 years.  Varicella vaccine. You may need this if you have not been vaccinated.  Zoster vaccine. You may need this after age 79.  Measles, mumps, and rubella (MMR) vaccine. You may need at least one dose of MMR if you were born in 1957 or later. You may also need a second dose.  Pneumococcal 13-valent conjugate (PCV13) vaccine. You may need this if you have certain conditions and were not previously vaccinated.  Pneumococcal polysaccharide (PPSV23) vaccine. You may need one or two doses if you smoke cigarettes or if you have certain conditions.  Meningococcal vaccine. You may need this if you have certain conditions.  Hepatitis A vaccine. You may need this if you have  certain conditions or if you travel or work in places where you may be exposed to hepatitis A.  Hepatitis B vaccine. You may need this if you have certain conditions or if you travel or work in places where you may be exposed to hepatitis B.  Haemophilus influenzae type b (Hib) vaccine. You may need this if you have certain conditions.  Talk to your health care provider about which screenings and vaccines you need and how often you need them. This information is not intended to replace advice given to you by your health care provider. Make sure you discuss any questions you have with your health care provider. Document Released: 10/12/2015 Document Revised: 06/04/2016 Document Reviewed: 07/17/2015 Elsevier Interactive Patient Education  2018 [place], MD [facility] Jacklynn Ganong

## 2018-09-01 NOTE — Patient Instructions (Addendum)
-It was nice meeting you today.  -We will send out referral to [facility].  -Make sure you schedule follow-up with GYN.  -We will start Lexapro 5 mg daily.  -We will let you know results of blood work.  -Please schedule follow-up with me in 3 months.   Preventive Care 40-64 Years, Female Preventive care refers to lifestyle choices and visits with your health care provider that can promote health and wellness. What does preventive care include?  A yearly physical exam. This is also called an annual well check.  Dental exams once or twice a year.  Routine eye exams. Ask your health care provider how often you should have your eyes checked.  Personal lifestyle choices, including: ? Daily care of your teeth and gums. ? Regular physical activity. ? Eating a healthy diet. ? Avoiding tobacco and drug use. ? Limiting alcohol use. ? Practicing safe sex. ? Taking low-dose aspirin daily starting at age 28. ? Taking vitamin and mineral supplements as recommended by your health care provider. What happens during an annual well check? The services and screenings done by your health care provider during your annual well check will depend on your age, overall health, lifestyle risk factors, and family history of disease. Counseling Your health care provider may ask you questions about your:  Alcohol use.  Tobacco use.  Drug use.  Emotional well-being.  Home and relationship well-being.  Sexual activity.  Eating habits.  Work and work Statistician.  Method of birth control.  Menstrual cycle.  Pregnancy history.  Screening You may have the following tests or measurements:  Height, weight, and BMI.  Blood pressure.  Lipid and cholesterol levels. These may be checked every 5 years, or more frequently if you are over 71 years old.  Skin check.  Lung cancer screening. You may have this screening every year starting at age 57 if you have a  30-pack-year history of smoking and currently smoke or have quit within the past 15 years.  Fecal occult blood test (FOBT) of the stool. You may have this test every year starting at age 49.  Flexible sigmoidoscopy or colonoscopy. You may have a sigmoidoscopy every 5 years or a colonoscopy every 10 years starting at age 53.  Hepatitis C blood test.  Hepatitis B blood test.  Sexually transmitted disease (STD) testing.  Diabetes screening. This is done by checking your blood sugar (glucose) after you have not eaten for a while (fasting). You may have this done every 1-3 years.  Mammogram. This may be done every 1-2 years. Talk to your health care provider about when you should start having regular mammograms. This may depend on whether you have a family history of breast cancer.  BRCA-related cancer screening. This may be done if you have a family history of breast, ovarian, tubal, or peritoneal cancers.  Pelvic exam and Pap test. This may be done every 3 years starting at age 6. Starting at age 9, this may be done every 5 years if you have a Pap test in combination with an HPV test.  Bone density scan. This is done to screen for osteoporosis. You may have this scan if you are at high risk for osteoporosis.  Discuss your test results, treatment options, and if necessary, the need for more tests with your health care provider. Vaccines Your health care provider may recommend certain vaccines, such as:  Influenza vaccine. This is recommended every year.  Tetanus, diphtheria, and acellular pertussis (  Tdap, Td) vaccine. You may need a Td booster every 10 years.  Varicella vaccine. You may need this if you have not been vaccinated.  Zoster vaccine. You may need this after age 60.  Measles, mumps, and rubella (MMR) vaccine. You may need at least one dose of MMR if you were born in 1957 or later. You may also need a second dose.  Pneumococcal 13-valent conjugate (PCV13) vaccine. You may  need this if you have certain conditions and were not previously vaccinated.  Pneumococcal polysaccharide (PPSV23) vaccine. You may need one or two doses if you smoke cigarettes or if you have certain conditions.  Meningococcal vaccine. You may need this if you have certain conditions.  Hepatitis A vaccine. You may need this if you have certain conditions or if you travel or work in places where you may be exposed to hepatitis A.  Hepatitis B vaccine. You may need this if you have certain conditions or if you travel or work in places where you may be exposed to hepatitis B.  Haemophilus influenzae type b (Hib) vaccine. You may need this if you have certain conditions.  Talk to your health care provider about which screenings and vaccines you need and how often you need them. This information is not intended to replace advice given to you by your health care provider. Make sure you discuss any questions you have with your health care provider. Document Released: 10/12/2015 Document Revised: 06/04/2016 Document Reviewed: 07/17/2015 Elsevier Interactive Patient Education  2018 Elsevier Inc.  

## 2018-09-01 NOTE — Telephone Encounter (Signed)
Madison Tapia at [facility] called with CRITICAL VALUE  Hgb 7.8 Hct 26.9  Dr. Jerilee Hoh advised.

## 2018-09-01 NOTE — Telephone Encounter (Signed)
Addressed.

## 2018-09-02 LAB — HIV ANTIBODY (ROUTINE TESTING W REFLEX): HIV 1&2 Ab, 4th Generation: NONREACTIVE

## 2018-09-03 ENCOUNTER — Other Ambulatory Visit: Payer: Self-pay | Admitting: Internal Medicine

## 2018-09-03 DIAGNOSIS — Z0000 Encounter for general adult medical examination without abnormal findings: Secondary | ICD-10-CM

## 2018-09-06 ENCOUNTER — Ambulatory Visit (INDEPENDENT_AMBULATORY_CARE_PROVIDER_SITE_OTHER): Payer: BLUE CROSS/BLUE SHIELD

## 2018-09-06 DIAGNOSIS — E538 Deficiency of other specified B group vitamins: Secondary | ICD-10-CM

## 2018-09-06 MED ORDER — CYANOCOBALAMIN 1000 MCG/ML IJ SOLN
1000.0000 ug | Freq: Once | INTRAMUSCULAR | Status: AC
  Administered 2018-09-06: 1000 ug via INTRAMUSCULAR

## 2018-09-06 NOTE — Progress Notes (Addendum)
After obtaining consent, and per orders of Dr. Jerilee Hoh, injection of B12 given by Melburn Hake Cox. Patient instructed to remain in clinic for 20 minutes afterwards, and to report any adverse reaction to me immediately.  Agree with injection as above.  I was available in office on this date for any questions or concerns.  Eulas Post MD [facility] at [facility]

## 2018-09-07 DIAGNOSIS — F4312 Post-traumatic stress disorder, chronic: Secondary | ICD-10-CM | POA: Diagnosis not present

## 2018-09-13 ENCOUNTER — Ambulatory Visit (INDEPENDENT_AMBULATORY_CARE_PROVIDER_SITE_OTHER): Payer: BLUE CROSS/BLUE SHIELD | Admitting: *Deleted

## 2018-09-13 DIAGNOSIS — E538 Deficiency of other specified B group vitamins: Secondary | ICD-10-CM | POA: Diagnosis not present

## 2018-09-13 MED ORDER — CYANOCOBALAMIN 1000 MCG/ML IJ SOLN
1000.0000 ug | Freq: Once | INTRAMUSCULAR | Status: AC
  Administered 2018-09-13: 1000 ug via INTRAMUSCULAR

## 2018-09-13 NOTE — Progress Notes (Signed)
Per orders of Dr. Elease Hashimoto as Dr Jerilee Hoh is not in the office today, injection of Cyanocobalamin 1039mcg given by Agnes Lawrence. Patient tolerated injection well.

## 2018-09-20 ENCOUNTER — Other Ambulatory Visit: Payer: Self-pay | Admitting: Internal Medicine

## 2018-09-20 ENCOUNTER — Ambulatory Visit (INDEPENDENT_AMBULATORY_CARE_PROVIDER_SITE_OTHER): Payer: BLUE CROSS/BLUE SHIELD | Admitting: *Deleted

## 2018-09-20 DIAGNOSIS — Z0000 Encounter for general adult medical examination without abnormal findings: Secondary | ICD-10-CM

## 2018-09-20 DIAGNOSIS — E538 Deficiency of other specified B group vitamins: Secondary | ICD-10-CM | POA: Diagnosis not present

## 2018-09-20 MED ORDER — CYANOCOBALAMIN 1000 MCG/ML IJ SOLN
1000.0000 ug | Freq: Once | INTRAMUSCULAR | Status: AC
  Administered 2018-09-20: 1000 ug via INTRAMUSCULAR

## 2018-09-20 NOTE — Telephone Encounter (Signed)
Pt is requesting 20mg  Adderrall. Please send to [facility] pharmacy in [place].

## 2018-09-20 NOTE — Progress Notes (Addendum)
Per orders of Dr. Elease Hashimoto, injection of b12 given by Westley Hummer. Patient tolerated injection well.  Agree with injection as above.  Eulas Post MD [facility] at [facility]

## 2018-09-21 ENCOUNTER — Other Ambulatory Visit: Payer: Self-pay | Admitting: Internal Medicine

## 2018-09-21 MED ORDER — AMPHETAMINE-DEXTROAMPHETAMINE 20 MG PO TABS: 20.0000 mg | ORAL_TABLET | Freq: Every day | ORAL | 0 refills | Status: DC

## 2018-09-21 NOTE — Telephone Encounter (Signed)
We can refill 3 months worth

## 2018-09-27 ENCOUNTER — Ambulatory Visit (INDEPENDENT_AMBULATORY_CARE_PROVIDER_SITE_OTHER): Payer: BLUE CROSS/BLUE SHIELD | Admitting: *Deleted

## 2018-09-27 DIAGNOSIS — E538 Deficiency of other specified B group vitamins: Secondary | ICD-10-CM | POA: Diagnosis not present

## 2018-09-27 MED ORDER — CYANOCOBALAMIN 1000 MCG/ML IJ SOLN
1000.0000 ug | Freq: Once | INTRAMUSCULAR | Status: AC
  Administered 2018-09-27: 1000 ug via INTRAMUSCULAR

## 2018-09-27 NOTE — Progress Notes (Signed)
Per orders of Dr. Burchette, injection of B12 given by Aundra Pung. Patient tolerated injection well. 

## 2018-09-29 DIAGNOSIS — D649 Anemia, unspecified: Secondary | ICD-10-CM

## 2018-09-29 HISTORY — DX: Anemia, unspecified: D64.9

## 2018-11-10 ENCOUNTER — Other Ambulatory Visit: Payer: Self-pay | Admitting: Internal Medicine

## 2018-11-10 DIAGNOSIS — F339 Major depressive disorder, recurrent, unspecified: Secondary | ICD-10-CM

## 2019-01-05 ENCOUNTER — Other Ambulatory Visit: Payer: Self-pay | Admitting: Internal Medicine

## 2019-01-05 DIAGNOSIS — Z0000 Encounter for general adult medical examination without abnormal findings: Secondary | ICD-10-CM

## 2019-01-05 MED ORDER — LEVOTHYROXINE SODIUM 200 MCG PO TABS: ORAL_TABLET | ORAL | 1 refills | Status: DC

## 2019-02-17 ENCOUNTER — Other Ambulatory Visit: Payer: Self-pay | Admitting: Internal Medicine

## 2019-02-17 DIAGNOSIS — Z1231 Encounter for screening mammogram for malignant neoplasm of breast: Secondary | ICD-10-CM

## 2019-02-24 ENCOUNTER — Other Ambulatory Visit: Payer: Self-pay | Admitting: Internal Medicine

## 2019-02-24 ENCOUNTER — Ambulatory Visit (INDEPENDENT_AMBULATORY_CARE_PROVIDER_SITE_OTHER): Payer: BLUE CROSS/BLUE SHIELD | Admitting: Internal Medicine

## 2019-02-24 ENCOUNTER — Encounter: Payer: Self-pay | Admitting: Internal Medicine

## 2019-02-24 ENCOUNTER — Other Ambulatory Visit: Payer: Self-pay

## 2019-02-24 VITALS — BP 110/70 | HR 108 | Temp 99.6°F | Ht 66.0 in | Wt 312.5 lb

## 2019-02-24 DIAGNOSIS — E538 Deficiency of other specified B group vitamins: Secondary | ICD-10-CM | POA: Diagnosis not present

## 2019-02-24 DIAGNOSIS — Z0000 Encounter for general adult medical examination without abnormal findings: Secondary | ICD-10-CM | POA: Diagnosis not present

## 2019-02-24 DIAGNOSIS — F900 Attention-deficit hyperactivity disorder, predominantly inattentive type: Secondary | ICD-10-CM | POA: Diagnosis not present

## 2019-02-24 DIAGNOSIS — E559 Vitamin D deficiency, unspecified: Secondary | ICD-10-CM | POA: Diagnosis not present

## 2019-02-24 DIAGNOSIS — D500 Iron deficiency anemia secondary to blood loss (chronic): Secondary | ICD-10-CM

## 2019-02-24 DIAGNOSIS — F339 Major depressive disorder, recurrent, unspecified: Secondary | ICD-10-CM | POA: Diagnosis not present

## 2019-02-24 DIAGNOSIS — R102 Pelvic and perineal pain: Secondary | ICD-10-CM | POA: Diagnosis not present

## 2019-02-24 DIAGNOSIS — E039 Hypothyroidism, unspecified: Secondary | ICD-10-CM

## 2019-02-24 LAB — CBC WITH DIFFERENTIAL/PLATELET
Basophils Absolute: 0 10*3/uL (ref 0.0–0.1)
Basophils Relative: 0.5 % (ref 0.0–3.0)
Eosinophils Absolute: 0.1 10*3/uL (ref 0.0–0.7)
Eosinophils Relative: 1.3 % (ref 0.0–5.0)
HCT: 26.4 % — ABNORMAL LOW (ref 36.0–46.0)
Hemoglobin: 8 g/dL — CL (ref 12.0–15.0)
Lymphocytes Relative: 18.2 % (ref 12.0–46.0)
Lymphs Abs: 1.6 10*3/uL (ref 0.7–4.0)
MCHC: 30.2 g/dL (ref 30.0–36.0)
MCV: 59.6 fl — ABNORMAL LOW (ref 78.0–100.0)
Monocytes Absolute: 0.5 10*3/uL (ref 0.1–1.0)
Monocytes Relative: 6.1 % (ref 3.0–12.0)
Neutro Abs: 6.5 10*3/uL (ref 1.4–7.7)
Neutrophils Relative %: 73.9 % (ref 43.0–77.0)
Platelets: 269 10*3/uL (ref 150.0–400.0)
RBC: 4.42 Mil/uL (ref 3.87–5.11)
RDW: 20 % — ABNORMAL HIGH (ref 11.5–15.5)
WBC: 8.8 10*3/uL (ref 4.0–10.5)

## 2019-02-24 LAB — VITAMIN B12: Vitamin B-12: 220 pg/mL (ref 211–911)

## 2019-02-24 LAB — VITAMIN D 25 HYDROXY (VIT D DEFICIENCY, FRACTURES): VITD: 17.19 ng/mL — ABNORMAL LOW (ref 30.00–100.00)

## 2019-02-24 MED ORDER — AMPHETAMINE-DEXTROAMPHETAMINE 20 MG PO TABS: 20.0000 mg | ORAL_TABLET | Freq: Every day | ORAL | 0 refills | Status: DC

## 2019-02-24 MED ORDER — "SYRINGE/NEEDLE (DISP) 25G X 5/8"" 1 ML MISC": 3 refills | Status: DC

## 2019-02-24 MED ORDER — VITAMIN D (ERGOCALCIFEROL) 1.25 MG (50000 UNIT) PO CAPS: 50000.0000 [IU] | ORAL_CAPSULE | ORAL | 0 refills | Status: AC

## 2019-02-24 MED ORDER — ESCITALOPRAM OXALATE 5 MG PO TABS: 5.0000 mg | ORAL_TABLET | Freq: Every day | ORAL | 2 refills | Status: DC

## 2019-02-24 MED ORDER — CYANOCOBALAMIN 1000 MCG/ML IJ SOLN: 1000.0000 ug | INTRAMUSCULAR | 11 refills | Status: DC

## 2019-02-24 NOTE — Progress Notes (Signed)
Established Patient Office Visit     CC/Reason for Visit: CPE, follow up chronic conditions  HPI: Madison Tapia is a 48 y.o. female who is coming in today for the above mentioned reasons. Past Medical History is significant for: ADHD (has not been on Adderall for 5 months and would like to resume it as she has noticed lack of organization and decreased focus at work to the point that even her boss has mentioned it), hypothyroidism controlled on synthroid, morbid obesity, vit D and B12 deficiency as well as severe iron deficiency anemia. She completed 12 weeks of vit D, she has not had B12 since January, she has not been taking iron consistently, maybe only 3-4 times a week. She also has depression and ran out of lexapro 1 month ago and would like it refilled as she feels she did well while on it.  She continues to have menometrorrhagia and has not yet seen GYN. She is a never smoker, ETOH occasionally. She is due for a mammogram. Is UTD on immunizations. She has routine eye care but no dental care.   Past Medical/Surgical History: Past Medical History:  Diagnosis Date  . ADD 07/14/2007  . ANXIETY 06/24/2007  . COMMON MIGRAINE 06/24/2007  . DEPRESSION 06/24/2007  . Headache(784.0) 10/08/2007  . HX, URINARY INFECTION 06/24/2007  . IRRITABLE BOWEL SYNDROME, HX OF 06/24/2007  . MALAISE AND FATIGUE 06/24/2007  . 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 Unspecified hypothyroidism 07/14/2007    No past surgical history on file.  Social History:  reports that she has never smoked. She has never used smokeless tobacco. She reports current alcohol use. She reports that she does not use drugs.  Allergies: Allergies  Allergen Reactions  . Sulfonamide Derivatives     Family History:  Family History  Problem Relation Age of Onset  . Cancer Mother   . Thyroid disease Mother   . Breast cancer Mother 1  . Diabetes Father   . Hypertension Father   . Mental illness Father   .  Depression Neg Hx        family hx     Current Outpatient Medications:  .  amphetamine-dextroamphetamine (ADDERALL) 10 MG tablet, Take 1 tablet (10 mg total) by mouth daily as needed (In the afternoon if needed)., Disp: 30 tablet, Rfl: 0 .  amphetamine-dextroamphetamine (ADDERALL) 10 MG tablet, Take 1 tablet (10 mg total) by mouth daily as needed. In the afternoon if needed, Disp: 30 tablet, Rfl: 0 .  amphetamine-dextroamphetamine (ADDERALL) 10 MG tablet, Take 1 tablet (10 mg total) by mouth daily as needed (In the afternoon if needed)., Disp: 30 tablet, Rfl: 0 .  amphetamine-dextroamphetamine (ADDERALL) 20 MG tablet, Take 1 tablet (20 mg total) by mouth daily., Disp: 30 tablet, Rfl: 0 .  amphetamine-dextroamphetamine (ADDERALL) 20 MG tablet, Take 1 tablet (20 mg total) by mouth daily., Disp: 30 tablet, Rfl: 0 .  amphetamine-dextroamphetamine (ADDERALL) 20 MG tablet, Take 1 tablet (20 mg total) by mouth daily., Disp: 30 tablet, Rfl: 0 .  escitalopram (LEXAPRO) 5 MG tablet, Take 1 tablet (5 mg total) by mouth daily., Disp: 30 tablet, Rfl: 2 .  ferrous sulfate 325 (65 FE) MG tablet, Take 325 mg by mouth 3 (three) times daily with meals., Disp: , Rfl:  .  levothyroxine (SYNTHROID, LEVOTHROID) 200 MCG tablet, TAKE 1 TABLET BY MOUTH ONCE DAILY BEFORE BREAKFAST, Disp: 90 tablet, Rfl: 1 .  cyanocobalamin (,VITAMIN B-12,) 1000 MCG/ML injection, Inject 1 mL (1,000 mcg  total) into the muscle every 30 (thirty) days., Disp: 1 mL, Rfl: 11 .  SYRINGE/NEEDLE, DISP, 1 ML (B-D SYRINGE/NEEDLE 1CC/25GX5/8) 25G X 5/8" 1 ML MISC, Use for B12 injections, Disp: 100 each, Rfl: 3  Review of Systems:  Constitutional: Denies fever, chills, diaphoresis, appetite change and fatigue.  HEENT: Denies photophobia, eye pain, redness, hearing loss, ear pain, congestion, sore throat, rhinorrhea, sneezing, mouth sores, trouble swallowing, neck pain, neck stiffness and tinnitus.   Respiratory: Denies SOB, DOE, cough, chest  tightness,  and wheezing.   Cardiovascular: Denies chest pain, palpitations and leg swelling.  Gastrointestinal: Denies nausea, vomiting, abdominal pain, diarrhea, constipation, blood in stool and abdominal distention.  Genitourinary: Denies dysuria, urgency, frequency, hematuria, flank pain and difficulty urinating.  Endocrine: Denies: hot or cold intolerance, sweats, changes in hair or nails, polyuria, polydipsia. Musculoskeletal: Denies myalgias, back pain, joint swelling, arthralgias and gait problem.  Skin: Denies pallor, rash and wound.  Neurological: Denies dizziness, seizures, syncope, weakness, light-headedness, numbness and headaches.  Hematological: Denies adenopathy. Easy bruising, personal or family bleeding history  Psychiatric/Behavioral: Denies suicidal ideation, mood changes, confusion, nervousness, sleep disturbance and agitation    Physical Exam: Vitals:   02/24/19 0808  BP: 110/70  Pulse: (!) 108  Temp: 99.6 F (37.6 C)  TempSrc: Oral  SpO2: 98%  Weight: (!) 312 lb 8 oz (141.7 kg)  Height: 5' 6"  (1.676 m)    Body mass index is 50.44 kg/m.   Constitutional: NAD, calm, comfortable, obese Eyes: PERRL, lids and conjunctivae normal ENMT: Mucous membranes are moist. Posterior pharynx clear of any exudate or lesions. Normal dentition. Tympanic membrane is pearly white, no erythema or bulging. Neck: normal, supple, no masses, no thyromegaly Respiratory: clear to auscultation bilaterally, no wheezing, no crackles. Normal respiratory effort. No accessory muscle use.  Cardiovascular: Regular rate and rhythm, no murmurs / rubs / gallops. No extremity edema. 2+ pedal pulses. No carotid bruits.  Abdomen: no tenderness, no masses palpated. No hepatosplenomegaly. Bowel sounds positive.  Musculoskeletal: no clubbing / cyanosis. No joint deformity upper and lower extremities. Good ROM, no contractures. Normal muscle tone.  Skin: no rashes, lesions, ulcers. No induration  Neurologic: CN 2-12 grossly intact. Sensation intact, DTR normal. Strength 5/5 in all 4.  Psychiatric: Normal judgment and insight. Alert and oriented x 3. Normal mood.    Impression and Plan:  Pelvic pain  -With menometrorrhagia causing severe iron deficiency anemia. -Urgent GYN referral requested.  Encounter for preventive health examination  -UTD on immunizations. -Due for mammogram that has been scheduled over the summer (was delayed due to COVID-19). -Advised routine eye and dental care. -Labs to be done today. -Referral to GYN placed.  Depression, recurrent ([place])  -Refill lexapro.    Office Visit from 02/24/2019 in [facility]  PHQ-9 Total Score  6       Acquired hypothyroidism -TSH 3.820 in 12/19. -Continue current synthroid dose.  Attention deficit hyperactivity disorder (ADHD), predominantly inattentive type -Resume Adderall 20 mg q am.  Morbid obesity ([place]) -Discussed healthy lifestyle, including increased physical activity and better food choices to promote weight loss.   Vitamin B12 deficiency -She will do monthly injections at home as opposed to coming into the office.  Vitamin D deficiency -Completed 12 weeks of high-dose vit D. -Recheck levels today to determine further supplementation needs.  Iron deficiency anemia due to chronic blood loss -Advised she resume ferrous sulfate TID with meals. -Recheck Hb today. -NEEDS GYN appointment to control source of bleeding.  Patient Instructions  -Nice seeing you today!!  -Lab work today; will notify you when results are available.  -Make sure you see GYN ASAP.  -Make sure you schedule eye and dental care routinely.  -Make sure you have your mammogram this year.   Preventive Care 40-64 Years, Female Preventive care refers to lifestyle choices and visits with your health care provider that can promote health and wellness. What does preventive care include?   A yearly  physical exam. This is also called an annual well check.  Dental exams once or twice a year.  Routine eye exams. Ask your health care provider how often you should have your eyes checked.  Personal lifestyle choices, including: ? Daily care of your teeth and gums. ? Regular physical activity. ? Eating a healthy diet. ? Avoiding tobacco and drug use. ? Limiting alcohol use. ? Practicing safe sex. ? Taking low-dose aspirin daily starting at age 30. ? Taking vitamin and mineral supplements as recommended by your health care provider. What happens during an annual well check? The services and screenings done by your health care provider during your annual well check will depend on your age, overall health, lifestyle risk factors, and family history of disease. Counseling Your health care provider may ask you questions about your:  Alcohol use.  Tobacco use.  Drug use.  Emotional well-being.  Home and relationship well-being.  Sexual activity.  Eating habits.  Work and work Statistician.  Method of birth control.  Menstrual cycle.  Pregnancy history. Screening You may have the following tests or measurements:  Height, weight, and BMI.  Blood pressure.  Lipid and cholesterol levels. These may be checked every 5 years, or more frequently if you are over 71 years old.  Skin check.  Lung cancer screening. You may have this screening every year starting at age 57 if you have a 30-pack-year history of smoking and currently smoke or have quit within the past 15 years.  Colorectal cancer screening. All adults should have this screening starting at age 74 and continuing until age 21. Your health care provider may recommend screening at age 49. You will have tests every 1-10 years, depending on your results and the type of screening test. People at increased risk should start screening at an earlier age. Screening tests may include: ? Guaiac-based fecal occult blood testing. ?  Fecal immunochemical test (FIT). ? Stool DNA test. ? Virtual colonoscopy. ? Sigmoidoscopy. During this test, a flexible tube with a tiny camera (sigmoidoscope) is used to examine your rectum and lower colon. The sigmoidoscope is inserted through your anus into your rectum and lower colon. ? Colonoscopy. During this test, a long, thin, flexible tube with a tiny camera (colonoscope) is used to examine your entire colon and rectum.  Hepatitis C blood test.  Hepatitis B blood test.  Sexually transmitted disease (STD) testing.  Diabetes screening. This is done by checking your blood sugar (glucose) after you have not eaten for a while (fasting). You may have this done every 1-3 years.  Mammogram. This may be done every 1-2 years. Talk to your health care provider about when you should start having regular mammograms. This may depend on whether you have a family history of breast cancer.  BRCA-related cancer screening. This may be done if you have a family history of breast, ovarian, tubal, or peritoneal cancers.  Pelvic exam and Pap test. This may be done every 3 years starting at age 45. Starting at age 90, this 48  be done every 5 years if you have a Pap test in combination with an HPV test.  Bone density scan. This is done to screen for osteoporosis. You may have this scan if you are at high risk for osteoporosis. Discuss your test results, treatment options, and if necessary, the need for more tests with your health care provider. Vaccines Your health care provider may recommend certain vaccines, such as:  Influenza vaccine. This is recommended every year.  Tetanus, diphtheria, and acellular pertussis (Tdap, Td) vaccine. You may need a Td booster every 10 years.  Varicella vaccine. You may need this if you have not been vaccinated.  Zoster vaccine. You may need this after age 75.  Measles, mumps, and rubella (MMR) vaccine. You may need at least one dose of MMR if you were born in 1957  or later. You may also need a second dose.  Pneumococcal 13-valent conjugate (PCV13) vaccine. You may need this if you have certain conditions and were not previously vaccinated.  Pneumococcal polysaccharide (PPSV23) vaccine. You may need one or two doses if you smoke cigarettes or if you have certain conditions.  Meningococcal vaccine. You may need this if you have certain conditions.  Hepatitis A vaccine. You may need this if you have certain conditions or if you travel or work in places where you may be exposed to hepatitis A.  Hepatitis B vaccine. You may need this if you have certain conditions or if you travel or work in places where you may be exposed to hepatitis B.  Haemophilus influenzae type b (Hib) vaccine. You may need this if you have certain conditions. Talk to your health care provider about which screenings and vaccines you need and how often you need them. This information is not intended to replace advice given to you by your health care provider. Make sure you discuss any questions you have with your health care provider. Document Released: 10/12/2015 Document Revised: 11/05/2017 Document Reviewed: 07/17/2015 Elsevier Interactive Patient Education  2019 [place], MD [facility]

## 2019-02-24 NOTE — Patient Instructions (Signed)
-Nice seeing you today!!  -Lab work today; will notify you when results are available.  -Make sure you see GYN ASAP.  -Make sure you schedule eye and dental care routinely.  -Make sure you have your mammogram this year.   Preventive Care 40-64 Years, Female Preventive care refers to lifestyle choices and visits with your health care provider that can promote health and wellness. What does preventive care include?   A yearly physical exam. This is also called an annual well check.  Dental exams once or twice a year.  Routine eye exams. Ask your health care provider how often you should have your eyes checked.  Personal lifestyle choices, including: ? Daily care of your teeth and gums. ? Regular physical activity. ? Eating a healthy diet. ? Avoiding tobacco and drug use. ? Limiting alcohol use. ? Practicing safe sex. ? Taking low-dose aspirin daily starting at age 70. ? Taking vitamin and mineral supplements as recommended by your health care provider. What happens during an annual well check? The services and screenings done by your health care provider during your annual well check will depend on your age, overall health, lifestyle risk factors, and family history of disease. Counseling Your health care provider may ask you questions about your:  Alcohol use.  Tobacco use.  Drug use.  Emotional well-being.  Home and relationship well-being.  Sexual activity.  Eating habits.  Work and work Statistician.  Method of birth control.  Menstrual cycle.  Pregnancy history. Screening You may have the following tests or measurements:  Height, weight, and BMI.  Blood pressure.  Lipid and cholesterol levels. These may be checked every 5 years, or more frequently if you are over 62 years old.  Skin check.  Lung cancer screening. You may have this screening every year starting at age 39 if you have a 30-pack-year history of smoking and currently smoke or have quit  within the past 15 years.  Colorectal cancer screening. All adults should have this screening starting at age 53 and continuing until age 90. Your health care provider may recommend screening at age 39. You will have tests every 1-10 years, depending on your results and the type of screening test. People at increased risk should start screening at an earlier age. Screening tests may include: ? Guaiac-based fecal occult blood testing. ? Fecal immunochemical test (FIT). ? Stool DNA test. ? Virtual colonoscopy. ? Sigmoidoscopy. During this test, a flexible tube with a tiny camera (sigmoidoscope) is used to examine your rectum and lower colon. The sigmoidoscope is inserted through your anus into your rectum and lower colon. ? Colonoscopy. During this test, a long, thin, flexible tube with a tiny camera (colonoscope) is used to examine your entire colon and rectum.  Hepatitis C blood test.  Hepatitis B blood test.  Sexually transmitted disease (STD) testing.  Diabetes screening. This is done by checking your blood sugar (glucose) after you have not eaten for a while (fasting). You may have this done every 1-3 years.  Mammogram. This may be done every 1-2 years. Talk to your health care provider about when you should start having regular mammograms. This may depend on whether you have a family history of breast cancer.  BRCA-related cancer screening. This may be done if you have a family history of breast, ovarian, tubal, or peritoneal cancers.  Pelvic exam and Pap test. This may be done every 3 years starting at age 4. Starting at age 77, this may be done every 5  years if you have a Pap test in combination with an HPV test.  Bone density scan. This is done to screen for osteoporosis. You may have this scan if you are at high risk for osteoporosis. Discuss your test results, treatment options, and if necessary, the need for more tests with your health care provider. Vaccines Your health care  provider may recommend certain vaccines, such as:  Influenza vaccine. This is recommended every year.  Tetanus, diphtheria, and acellular pertussis (Tdap, Td) vaccine. You may need a Td booster every 10 years.  Varicella vaccine. You may need this if you have not been vaccinated.  Zoster vaccine. You may need this after age 55.  Measles, mumps, and rubella (MMR) vaccine. You may need at least one dose of MMR if you were born in 1957 or later. You may also need a second dose.  Pneumococcal 13-valent conjugate (PCV13) vaccine. You may need this if you have certain conditions and were not previously vaccinated.  Pneumococcal polysaccharide (PPSV23) vaccine. You may need one or two doses if you smoke cigarettes or if you have certain conditions.  Meningococcal vaccine. You may need this if you have certain conditions.  Hepatitis A vaccine. You may need this if you have certain conditions or if you travel or work in places where you may be exposed to hepatitis A.  Hepatitis B vaccine. You may need this if you have certain conditions or if you travel or work in places where you may be exposed to hepatitis B.  Haemophilus influenzae type b (Hib) vaccine. You may need this if you have certain conditions. Talk to your health care provider about which screenings and vaccines you need and how often you need them. This information is not intended to replace advice given to you by your health care provider. Make sure you discuss any questions you have with your health care provider. Document Released: 10/12/2015 Document Revised: 11/05/2017 Document Reviewed: 07/17/2015 Elsevier Interactive Patient Education  2019 [employer].

## 2019-03-01 ENCOUNTER — Other Ambulatory Visit: Payer: Self-pay | Admitting: Internal Medicine

## 2019-03-01 DIAGNOSIS — E559 Vitamin D deficiency, unspecified: Secondary | ICD-10-CM

## 2019-03-04 ENCOUNTER — Other Ambulatory Visit: Payer: Self-pay

## 2019-03-07 ENCOUNTER — Other Ambulatory Visit: Payer: Self-pay

## 2019-03-07 ENCOUNTER — Ambulatory Visit: Payer: BC Managed Care – PPO | Admitting: Obstetrics & Gynecology

## 2019-03-07 ENCOUNTER — Ambulatory Visit (HOSPITAL_COMMUNITY)
Admission: RE | Admit: 2019-03-07 | Discharge: 2019-03-07 | Disposition: A | Discharge status: Home / Self Care | Payer: BLUE CROSS/BLUE SHIELD | Source: Ambulatory Visit | Attending: Internal Medicine | Admitting: Internal Medicine

## 2019-03-07 DIAGNOSIS — D500 Iron deficiency anemia secondary to blood loss (chronic): Secondary | ICD-10-CM | POA: Insufficient documentation

## 2019-03-07 MED ORDER — SODIUM CHLORIDE 0.9 % IV SOLN
INTRAVENOUS | Status: DC | PRN
  Administered 2019-03-07 (×2): 250 mL via INTRAVENOUS

## 2019-03-07 MED ORDER — SODIUM CHLORIDE 0.9 % IV SOLN
510.0000 mg | Freq: Once | INTRAVENOUS | Status: AC
  Administered 2019-03-07: 510 mg via INTRAVENOUS
  Filled 2019-03-07: qty 17

## 2019-03-07 NOTE — Discharge Instructions (Signed)

## 2019-03-07 NOTE — Progress Notes (Signed)
PATIENT CARE CENTER NOTE  Diagnosis: Iron Deficiency Anemia D50.0   Provider: Domingo Mend, MD   Procedure: IV Feraheme   Note: Patient received Feraheme infusion via PIV. Tolerated well. Observed patient for 30 minutes post-infusion. Vital signs WNL. Discharge instructions given. Alert, oriented and ambulatory at discharge.

## 2019-03-08 ENCOUNTER — Ambulatory Visit (INDEPENDENT_AMBULATORY_CARE_PROVIDER_SITE_OTHER): Payer: BC Managed Care – PPO | Admitting: Obstetrics & Gynecology

## 2019-03-08 ENCOUNTER — Other Ambulatory Visit: Payer: Self-pay

## 2019-03-08 ENCOUNTER — Encounter: Payer: Self-pay | Admitting: Obstetrics & Gynecology

## 2019-03-08 VITALS — BP 132/84 | Ht 65.5 in | Wt 311.0 lb

## 2019-03-08 DIAGNOSIS — D649 Anemia, unspecified: Secondary | ICD-10-CM | POA: Diagnosis not present

## 2019-03-08 DIAGNOSIS — N921 Excessive and frequent menstruation with irregular cycle: Secondary | ICD-10-CM

## 2019-03-08 DIAGNOSIS — R1032 Left lower quadrant pain: Secondary | ICD-10-CM | POA: Diagnosis not present

## 2019-03-08 MED ORDER — MEGESTROL ACETATE 40 MG PO TABS: 40.0000 mg | ORAL_TABLET | Freq: Two times a day (BID) | ORAL | 1 refills | Status: DC

## 2019-03-08 NOTE — Progress Notes (Signed)
    Madison Tapia 1971/03/25 [id number]        48 y.o.  G1P1L1  Stable boyfriend x 15 yrs.  Has a 50 yo son.  RP: Menometrorrhagia with pelvic cramping x >1 year  HPI: Menometrorrhagia with pelvic cramping and left pelvic pain x >1 year.  No treatment, no contraception.  Hb 02/24/2019 at 8.0.  Received IV Iron x 1 yesterday.  No imaging of the pelvis done.  Hypothyroidism on Synthroid, TSH wnl 2 weeks ago.   OB History  Gravida Para Term Preterm AB Living  1 1       1   SAB TAB Ectopic Multiple Live Births               # Outcome Date GA Lbr Len/2nd Weight Sex Delivery Anes PTL Lv  1 Para             Past medical history,surgical history, problem list, medications, allergies, family history and social history were all reviewed and documented in the EPIC chart.   Directed ROS with pertinent positives and negatives documented in the history of present illness/assessment and plan.  Exam:  Vitals:   03/08/19 1038  BP: (!) 146/90  Weight: (!) 311 lb (141.1 kg)  Height: 5' 5.5" (1.664 m)   General appearance:  Normal  Abdomen: Normal  Gynecologic exam: Vulva normal.  Speculum: Cervix and vagina normal with mild dark menstrual blood coming out at the external os of the cervix.  Bimanual exam: Uterus anteverted mildly increased in volume, nontender, mobile.  No adnexal mass felt, nontender bilaterally.   Assessment/Plan:  48 y.o. G1P1   1. Menometrorrhagia Persistent menometrorrhagia with pelvic cramping and left pelvic pain for more than a year.  Severe secondary anemia with hemoglobin at 8.0 on Feb 24, 2019.  IV iron received yesterday.  On pelvic exam today, suspicion of uterine fibroids.  Will start on Megace 40 mg twice a day until follow-up pelvic ultrasound to further investigate.  No contraindication to Megace.  Usage reviewed and prescription sent to pharmacy.  Pelvic ultrasound to evaluate for uterine fibroids, polyps and to rule out endometrial pathology such as  hyperplasia and endometrial cancer, will do an endometrial biopsy as needed.  We will repeat a CBC at follow-up. - CBC; Future - US Transvaginal Non-OB; Future  2. Secondary anemia Most likely secondary to menometrorrhagia.  IV iron received.  Will start on Megace 40 mg twice a day to control vaginal repeat a CBC at follow-up.  Bleeding. - CBC; Future  3. Left lower quadrant pain Pelvic ultrasound to rule out left pelvic pathology, especially a left ovarian cyst or mass.  The left lower quadrant pain could be associated with uterine fibroids. - US Transvaginal Non-OB; Future  Other orders - megestrol (MEGACE) 40 MG tablet; Take 1 tablet (40 mg total) by mouth 2 (two) times daily.  Counseling on above issues and coordination of care more than 50% for 30 minutes.  Princess Bruins MD, 10:51 AM 03/08/2019

## 2019-03-08 NOTE — Patient Instructions (Signed)
1. Menometrorrhagia Persistent menometrorrhagia with pelvic cramping and left pelvic pain for more than a year.  Severe secondary anemia with hemoglobin at 8.0 on Feb 24, 2019.  IV iron received yesterday.  On pelvic exam today, suspicion of uterine fibroids.  Will start on Megace 40 mg twice a day until follow-up pelvic ultrasound to further investigate.  No contraindication to Megace.  Usage reviewed and prescription sent to pharmacy.  Pelvic ultrasound to evaluate for uterine fibroids, polyps and to rule out endometrial pathology such as hyperplasia and endometrial cancer, will do an endometrial biopsy as needed.  We will repeat a CBC at follow-up. - CBC; Future - US Transvaginal Non-OB; Future  2. Secondary anemia Most likely secondary to menometrorrhagia.  IV iron received.  Will start on Megace 40 mg twice a day to control vaginal repeat a CBC at follow-up.  Bleeding. - CBC; Future  3. Left lower quadrant pain Pelvic ultrasound to rule out left pelvic pathology, especially a left ovarian cyst or mass.  The left lower quadrant pain could be associated with uterine fibroids. - US Transvaginal Non-OB; Future  Other orders - megestrol (MEGACE) 40 MG tablet; Take 1 tablet (40 mg total) by mouth 2 (two) times daily.  Madison Tapia, it was a pleasure meeting you today!

## 2019-03-23 ENCOUNTER — Other Ambulatory Visit: Payer: Self-pay

## 2019-03-24 ENCOUNTER — Other Ambulatory Visit: Payer: Self-pay | Admitting: Obstetrics & Gynecology

## 2019-03-24 ENCOUNTER — Encounter: Payer: Self-pay | Admitting: Obstetrics & Gynecology

## 2019-03-24 ENCOUNTER — Ambulatory Visit (INDEPENDENT_AMBULATORY_CARE_PROVIDER_SITE_OTHER): Payer: BC Managed Care – PPO

## 2019-03-24 ENCOUNTER — Ambulatory Visit (INDEPENDENT_AMBULATORY_CARE_PROVIDER_SITE_OTHER): Payer: BC Managed Care – PPO | Admitting: Obstetrics & Gynecology

## 2019-03-24 DIAGNOSIS — D649 Anemia, unspecified: Secondary | ICD-10-CM | POA: Diagnosis not present

## 2019-03-24 DIAGNOSIS — N852 Hypertrophy of uterus: Secondary | ICD-10-CM | POA: Diagnosis not present

## 2019-03-24 DIAGNOSIS — D219 Benign neoplasm of connective and other soft tissue, unspecified: Secondary | ICD-10-CM | POA: Diagnosis not present

## 2019-03-24 DIAGNOSIS — R1032 Left lower quadrant pain: Secondary | ICD-10-CM

## 2019-03-24 DIAGNOSIS — N921 Excessive and frequent menstruation with irregular cycle: Secondary | ICD-10-CM

## 2019-03-24 DIAGNOSIS — R9389 Abnormal findings on diagnostic imaging of other specified body structures: Secondary | ICD-10-CM

## 2019-03-24 MED ORDER — MEGESTROL ACETATE 40 MG PO TABS: 40.0000 mg | ORAL_TABLET | Freq: Two times a day (BID) | ORAL | 0 refills | Status: DC

## 2019-03-24 NOTE — Progress Notes (Signed)
    Madison Tapia 08-18-1971 [id number]        48 y.o.  G1P1L1  RP: Menometrorrhagia with secondary anemia and left pelvic pain for pelvic ultrasound  HPI: Menometrorrhagia with secondary anemia.  Received IV iron on March 07, 2019.  Hemoglobin was at 8.0.  Responded to Megace with minimal spotting recently.  Left pelvic pain intermittently severe.  Severe cramping when bleeding.  No desire to preserve fertility at age 16.    OB History  Gravida Para Term Preterm AB Living  1 1       1   SAB TAB Ectopic Multiple Live Births               # Outcome Date GA Lbr Len/2nd Weight Sex Delivery Anes PTL Lv  1 Para             Past medical history,surgical history, problem list, medications, allergies, family history and social history were all reviewed and documented in the EPIC chart.   Directed ROS with pertinent positives and negatives documented in the history of present illness/assessment and plan.  Exam:  There were no vitals filed for this visit. General appearance:  Normal  Pelvic US today: T/a and T/V images.  Enlarged uterus measuring 14.35 x 10.95 x 6.36 cm.  Inhomogenous myometrium suggestive of adenomyosis.  Pedunculated fibroid on the left side measuring 5.6 x 3.3 cm and tender.  Irregular thickened endometrial lining at 14.5 mm, cannot rule out a mass.  Both ovaries are mildly increased in size with 48-25 small follicles.  No free fluid in the posterior to the sac.   Assessment/Plan:  48 y.o. G1P1   1. Menometrorrhagia Menometrorrhagia with secondary anemia.  Pelvic ultrasound findings thoroughly reviewed with patient.  Enlarged uterus suggestive of adenomyosis and a pedunculated fibroid on the left side measuring 5.6 cm.  The endometrial lining is thickened and irregular measuring 14.5 mm, cannot rule out a mass.  Ovaries have a polycystic ovary appearance bilaterally.  Will continue on Megace until follow-up with a sonohysterogram.  Repeat a CBC today.  Per  sonohysterogram findings will decide on management.  Patient is likely to opt for a total hysterectomy with bilateral salpingectomy and preservation of her ovaries.   - CBC - [place] Sonohysterogram; Future  2. Secondary anemia Recheck hemoglobin today. - CBC  3. Left lower quadrant pain Probably due to the left pedunculated fibroid measuring 5.6 cm.    4. Fibroids As above.  5. Thickened endometrium We will proceed with a sonohysterogram to investigate.  Procedure reviewed with patient. - [place] Sonohysterogram; Future  Other orders - megestrol (MEGACE) 40 MG tablet; Take 1 tablet (40 mg total) by mouth 2 (two) times daily.  Counseling on above issues and coordination of care more than 50% for 15 minutes.  Princess Bruins MD, 3:31 PM 03/24/2019

## 2019-03-24 NOTE — Patient Instructions (Signed)
1. Menometrorrhagia Menometrorrhagia with secondary anemia.  Pelvic ultrasound findings thoroughly reviewed with patient.  Enlarged uterus suggestive of adenomyosis and a pedunculated fibroid on the left side measuring 5.6 cm.  The endometrial lining is thickened and irregular measuring 14.5 mm, cannot rule out a mass.  Ovaries have a polycystic ovary appearance bilaterally.  Will continue on Megace until follow-up with a sonohysterogram.  Repeat a CBC today.  Per sonohysterogram findings will decide on management.  Patient is likely to opt for a total hysterectomy with bilateral salpingectomy and preservation of her ovaries.   - CBC - [place] Sonohysterogram; Future  2. Secondary anemia Recheck hemoglobin today. - CBC  3. Left lower quadrant pain Probably due to the left pedunculated fibroid measuring 5.6 cm.    4. Fibroids As above.  5. Thickened endometrium We will proceed with a sonohysterogram to investigate.  Procedure reviewed with patient. - [place] Sonohysterogram; Future  Other orders - megestrol (MEGACE) 40 MG tablet; Take 1 tablet (40 mg total) by mouth 2 (two) times daily.  Genell, it was a pleasure seeing you today!  I will inform you of your results as soon as they are available.

## 2019-03-25 LAB — CBC
HCT: 35.1 % (ref 35.0–45.0)
Hemoglobin: 9.9 g/dL — ABNORMAL LOW (ref 11.7–15.5)
MCH: 19.6 pg — ABNORMAL LOW (ref 27.0–33.0)
MCHC: 28.2 g/dL — ABNORMAL LOW (ref 32.0–36.0)
MCV: 69.6 fL — ABNORMAL LOW (ref 80.0–100.0)
MPV: 10.7 fL (ref 7.5–12.5)
Platelets: 419 10*3/uL — ABNORMAL HIGH (ref 140–400)
RBC: 5.04 10*6/uL (ref 3.80–5.10)
RDW: 25.6 % — ABNORMAL HIGH (ref 11.0–15.0)
WBC: 8.3 10*3/uL (ref 3.8–10.8)

## 2019-03-28 ENCOUNTER — Telehealth: Payer: Self-pay | Admitting: Internal Medicine

## 2019-03-28 NOTE — Telephone Encounter (Signed)
Medication Refill - Medication: amphetamine-dextroamphetamine (ADDERALL) 20 MG tablet    Has the patient contacted their pharmacy? Yes.   (Agent: If no, request that the patient contact the pharmacy for the refill.) (Agent: If yes, when and what did the pharmacy advise?)  Preferred Pharmacy (with phone number or street name):  [facility] [address], [place]  [place] [address]  Phone: [phone] Fax: [phone]  Not a 24 hour pharmacy; exact hours not known.     Agent: Please be advised that RX refills may take up to 3 business days. We ask that you follow-up with your pharmacy.

## 2019-03-29 NOTE — Telephone Encounter (Signed)
Spoke with pharmacist and prescription will be filled today.

## 2019-03-30 DIAGNOSIS — Z86018 Personal history of other benign neoplasm: Secondary | ICD-10-CM | POA: Diagnosis not present

## 2019-03-30 DIAGNOSIS — L814 Other melanin hyperpigmentation: Secondary | ICD-10-CM | POA: Diagnosis not present

## 2019-03-30 DIAGNOSIS — L821 Other seborrheic keratosis: Secondary | ICD-10-CM | POA: Diagnosis not present

## 2019-03-30 DIAGNOSIS — Z85820 Personal history of malignant melanoma of skin: Secondary | ICD-10-CM | POA: Diagnosis not present

## 2019-04-06 ENCOUNTER — Other Ambulatory Visit: Payer: Self-pay

## 2019-04-07 ENCOUNTER — Ambulatory Visit (INDEPENDENT_AMBULATORY_CARE_PROVIDER_SITE_OTHER): Payer: BC Managed Care – PPO | Admitting: Obstetrics & Gynecology

## 2019-04-07 ENCOUNTER — Ambulatory Visit (INDEPENDENT_AMBULATORY_CARE_PROVIDER_SITE_OTHER): Payer: BC Managed Care – PPO

## 2019-04-07 ENCOUNTER — Other Ambulatory Visit: Payer: Self-pay | Admitting: Obstetrics & Gynecology

## 2019-04-07 DIAGNOSIS — R1032 Left lower quadrant pain: Secondary | ICD-10-CM | POA: Diagnosis not present

## 2019-04-07 DIAGNOSIS — N921 Excessive and frequent menstruation with irregular cycle: Secondary | ICD-10-CM

## 2019-04-07 DIAGNOSIS — R9389 Abnormal findings on diagnostic imaging of other specified body structures: Secondary | ICD-10-CM | POA: Diagnosis not present

## 2019-04-07 DIAGNOSIS — D649 Anemia, unspecified: Secondary | ICD-10-CM

## 2019-04-07 DIAGNOSIS — D219 Benign neoplasm of connective and other soft tissue, unspecified: Secondary | ICD-10-CM

## 2019-04-07 NOTE — Progress Notes (Signed)
Madison Tapia 02-28-1971 [id number]        48 y.o.  G1P1   RP: Menometrorrhagia/thickened endometrium/Fibroids for Sonohysterogram  HPI: Since last visit, no heavy vaginal bleeding.  Dull persistent pain in left pelvis.  Hb was 9.9 after IV Iron on 03/24/2019.  Menometrorrhagia with secondary anemia.  Received IV iron on March 07, 2019.  Responded to Megace with minimal spotting recently.  Left pelvic pain intermittently severe.  Severe cramping when bleeding. No desire to preserve fertility at age 36.    OB History  Gravida Para Term Preterm AB Living  1 1       1   SAB TAB Ectopic Multiple Live Births               # Outcome Date GA Lbr Len/2nd Weight Sex Delivery Anes PTL Lv  1 Para             Past medical history,surgical history, problem list, medications, allergies, family history and social history were all reviewed and documented in the EPIC chart.   Directed ROS with pertinent positives and negatives documented in the history of present illness/assessment and plan.  Exam:  There were no vitals filed for this visit. General appearance:  Normal                                                                    Sono Infusion Hysterogram ( procedure note)   The initial transvaginal ultrasound demonstrated the following: Comparison with previous scan on March 24, 2019: Unchanged enlarged uterus with a 5 cm pedunculated fibroid to the left.  Bilateral ovaries with multiple small follicles are stable.  The endometrial lining still appears irregular and has increased in thickness measured at 25.6 mm today. The speculum  was inserted and the cervix cleansed with Betadine solution after confirming that patient has no allergies.A small sonohysterography catheterwas utilized.  Insertion was facilitated with ring forceps, using a spear-like motion the catheter was inserted to the fundus of the uterus. The speculum is then removed carefully to avoid dislodging the catheter.  The catheter was flushed with sterile saline delete prior to insertion to rid it of small amounts of air.the sterile saline solution was infused into the uterine cavity as a vaginal ultrasound probe was then placed in the vagina for full visualization of the uterine cavity from a transvaginal approach. The following was noted: The intrauterine cavity is filled with saline which reveals several intracavitary masses measured at 2.2 cm, 2.0 cm, and 1.7 cm.  The cavity length is 8 to 9 cm. The catheter was then removed after retrieving some of the saline from the intrauterine cavity. An endometrial biopsy was not done. Patient tolerated procedure well. She had received a tablet of Aleve for discomfort.    Assessment/Plan:  48 y.o. G1P1   1. Menometrorrhagia Menometrorrhagia with fibroids several intra-uterine masses compatible with submucosal fibroids.  No desire to preserve fertility.  Given the refractory menometrorrhagia with secondary anemia and persistent left pelvic pain, decision to proceed with XI TLH/Bilateral Salpingectomy.  Surgery and risks reviewed with patient.  We will follow-up for preop visit to further discuss.  2. Thickened endometrium Intra-uterine lesions compatible with submucosal fibroids..  3. Fibroids Multiple uterine fibroids with enlarged  uterus and intra-uterine lesions.  4. Left lower quadrant pain Probably due to pedunculated fibroid.  5. Secondary anemia Will continue on Megace to control menometrorrhagia and improve hemoglobin until surgery.  Counseling on above issues and coordination of care more than 50% for 25 minutes.  Princess Bruins MD, 2:30 PM 04/07/2019

## 2019-04-08 ENCOUNTER — Ambulatory Visit
Admission: RE | Admit: 2019-04-08 | Discharge: 2019-04-08 | Disposition: A | Discharge status: Home / Self Care | Payer: BC Managed Care – PPO | Source: Ambulatory Visit | Attending: Internal Medicine | Admitting: Internal Medicine

## 2019-04-08 ENCOUNTER — Telehealth: Payer: Self-pay

## 2019-04-08 DIAGNOSIS — Z1231 Encounter for screening mammogram for malignant neoplasm of breast: Secondary | ICD-10-CM

## 2019-04-08 NOTE — Telephone Encounter (Signed)
I called patient to discuss scheduling surgery. I reviewed her insurance benefits and her estimated surgery prepymt due by one week prior to surgery.  We discussed available dates in August and she wants to consider dates over the weekend and call me Monday morning with her decision. I assured her that should be fine as I have no other robot cases at this time to be scheduled.

## 2019-04-11 NOTE — Telephone Encounter (Signed)
Patient called this morning with her date choice. However, upon reviewing her chart prior to scheduling I realized her BMI is higher that [facility] cut off. I called and spoke with Tomeka and the PA spoke with anesthesia and confirmed that she cannot have surgery performed there but will have to be at [facility].  Upon reviewing the OR schedule for [facility] there was no public robot time at all.  I spoke with scheduler who suggested I want to see if block is released a week or two before or do an add on at 4pm one day. Neither of these are acceptable options.  I called Holly in [place] to discuss but she is off until Thursday. [phone].    I called the patient and explained all this to her. She was very understanding. I told her that my manager will be back Thursday as well and we will figure something out. She is willing to be flexible and will take any time we can get her in in August after the 3rd. She knows I will be back in touch with her at end of week.

## 2019-04-12 ENCOUNTER — Telehealth: Payer: Self-pay | Admitting: *Deleted

## 2019-04-12 NOTE — Telephone Encounter (Signed)
Yes

## 2019-04-12 NOTE — Telephone Encounter (Signed)
[facility]  levothyroxine (SYNTHROID, LEVOTHROID) 200 MCG tablet  Pharmacy has a different manufacture for levothyroxine.  Okay to change?

## 2019-04-14 ENCOUNTER — Encounter: Payer: Self-pay | Admitting: Obstetrics & Gynecology

## 2019-04-14 NOTE — Patient Instructions (Signed)
1. Menometrorrhagia Menometrorrhagia with fibroids several intra-uterine masses compatible with submucosal fibroids.  No desire to preserve fertility.  Given the refractory menometrorrhagia with secondary anemia and persistent left pelvic pain, decision to proceed with XI TLH/Bilateral Salpingectomy.  Surgery and risks reviewed with patient.  We will follow-up for preop visit to further discuss.  2. Thickened endometrium Intra-uterine lesions compatible with submucosal fibroids..  3. Fibroids Multiple uterine fibroids with enlarged uterus and intra-uterine lesions.  4. Left lower quadrant pain Probably due to pedunculated fibroid.  5. Secondary anemia Will continue on Megace to control menometrorrhagia and improve hemoglobin until surgery.  Madison Tapia, it was a pleasure seeing you today!

## 2019-04-20 NOTE — Telephone Encounter (Signed)
Madison Tapia has been communicating with Jonelle Sidle and others. Today it was decided by Dr. Tobias Alexander, anethesia, that patient is indeed okay to have her surgery at [facility].  I called patient and informed her and scheduled her for 05/24/19 at 7:30am.  I scheduled her COVID test accordingly and advised her to quarantine at home from Covid test until surgery only going out for medical appointments.  Rosemarie Ax will be calling her back to schedule pre op. We had previously discussed her ins benefits and est surgery prepymt due. I will mail her a financial letter, [facility] pamphlet and Covid test instruction sheet.

## 2019-04-22 ENCOUNTER — Encounter: Payer: Self-pay | Admitting: Anesthesiology

## 2019-05-03 ENCOUNTER — Telehealth: Payer: Self-pay

## 2019-05-03 NOTE — Telephone Encounter (Signed)
I spoke with patient and informed her of POS change for Covid screen. She was advised to go to old [facility]. And to stay in Right lane, do not go to tent.

## 2019-05-09 ENCOUNTER — Telehealth: Payer: BC Managed Care – PPO | Admitting: Family Medicine

## 2019-05-16 ENCOUNTER — Other Ambulatory Visit: Payer: Self-pay

## 2019-05-17 ENCOUNTER — Ambulatory Visit (INDEPENDENT_AMBULATORY_CARE_PROVIDER_SITE_OTHER): Payer: BC Managed Care – PPO | Admitting: Obstetrics & Gynecology

## 2019-05-17 ENCOUNTER — Encounter: Payer: Self-pay | Admitting: Obstetrics & Gynecology

## 2019-05-17 VITALS — BP 128/80

## 2019-05-17 DIAGNOSIS — R1032 Left lower quadrant pain: Secondary | ICD-10-CM

## 2019-05-17 DIAGNOSIS — D219 Benign neoplasm of connective and other soft tissue, unspecified: Secondary | ICD-10-CM

## 2019-05-17 DIAGNOSIS — D649 Anemia, unspecified: Secondary | ICD-10-CM | POA: Diagnosis not present

## 2019-05-17 DIAGNOSIS — N921 Excessive and frequent menstruation with irregular cycle: Secondary | ICD-10-CM | POA: Diagnosis not present

## 2019-05-17 NOTE — Progress Notes (Signed)
Madison Tapia Oct 07, 1970 [id number]        48 y.o.  G1P1L1  RP: Preop XI Robotic TLH, Bilateral Salpingectomy for Fibroids/menometro/anemia/pelvic pain  HPI: Uterine Fibroids with pain and Menometro, severe secondary anemia currently mildly improved on Megace and after IV iron.  Last Hb 10.8 on 05/20/19.     OB History  Gravida Para Term Preterm AB Living  1 1       1   SAB TAB Ectopic Multiple Live Births               # Outcome Date GA Lbr Len/2nd Weight Sex Delivery Anes PTL Lv  1 Para             Past medical history,surgical history, problem list, medications, allergies, family history and social history were all reviewed and documented in the EPIC chart.   Directed ROS with pertinent positives and negatives documented in the history of present illness/assessment and plan.  Exam:  Vitals:   05/17/19 1307  BP: 128/80   General appearance:  Normal   Sono Infusion Hysterogram ( procedure note)   The initial transvaginal ultrasound demonstrated the following: Comparison with previous scan on March 24, 2019: Unchanged enlarged uterus measured at 14.35 x 10.95 x 6.36 cm with a 5 cm pedunculated fibroid to the left.  Bilateral ovaries with multiple small follicles are stable.  The endometrial lining still appears irregular and has increased in thickness measured at 25.6 mm today. The speculum  was inserted and the cervix cleansed with Betadine solution after confirming that patient has no allergies.A small sonohysterography catheterwas utilized.  Insertion was facilitated with ring forceps, using a spear-like motion the catheter was inserted to the fundus of the uterus. The speculum is then removed carefully to avoid dislodging the catheter. The catheter was flushed with sterile saline delete prior to insertion to rid it of small amounts of air.the sterile saline solution was infused into the uterine cavity as a vaginal ultrasound probe was then placed in the vagina for  full visualization of the uterine cavity from a transvaginal approach. The following was noted: The intrauterine cavity is filled with saline which reveals several intracavitary masses measured at 2.2 cm, 2.0 cm, and 1.7 cm.  The cavity length is 8 to 9 cm. The catheter was then removed after retrieving some of the saline from the intrauterine cavity. An endometrial biopsy was not done. Patient tolerated procedure well. She had received a tablet of Aleve for discomfort.    Assessment/Plan:  48 y.o. G1P1   1. Fibroids Enlarged uterus with many fibroids, overall uterine size at 14.35 x 10.95 x 6.36 cm.  Refractory menometrorrhagia with severe secondary anemia not completely controlled on Megace.  Pelvic pain and discomfort worsening progressively.  Decision to proceed with an exercise robotic total laparoscopic hysterectomy with bilateral salpingectomy.  Will preserve the ovaries.  Preop management, surgery and risks thoroughly reviewed and postop precautions reviewed.  Risks of trauma to the bowels, trauma to the ureters, trauma to the bladder, trauma to blood vessels with the risk of requiring a blood transfusion reviewed with patient.  Risks of infection, DVT/pulmonary embolism and risks associated with anesthesia reviewed.  Patient voiced understanding and agreement with plan.  2. Menometrorrhagia As above  3. Secondary anemia Last Hb 10.8 05/20/19.  4. Left lower quadrant pain As above  Counseling on above issues and coordination of care more than 50% for 15 minutes.  Princess Bruins MD, 1:27 PM 05/17/2019

## 2019-05-19 NOTE — Patient Instructions (Addendum)
YOU ARE SCHEDULED FOR A COVID TEST  Friday 05/20/2019 @  0910 . THIS TEST MUST BE DONE BEFORE SURGERY. GO TO  [address], [place], [address] AND REMAIN IN YOUR CAR, THIS IS A DRIVE UP TEST. ONCE YOUR COVID TEST IS DONE PLEASE FOLLOW ALL THE QUARANTINE  INSTRUCTIONS GIVEN IN YOUR HANDOUT.      Your procedure is scheduled on Tuesday 05/24/2019  Report to [facility]. M.   Call this number if you have problems the morning of surgery  :[phone].   OUR ADDRESS IS [address].  WE ARE LOCATED IN [facility].                                     REMEMBER:   DO NOT EAT FOOD AFTER MIDNIGHT .   NO SOLID FOOD AFTER MIDNIGHT THE NIGHT PRIOR TO SURGERY. NOTHING BY MOUTH EXCEPT CLEAR LIQUIDS UNTIL 0430 am .  PLEASE FINISH ENSURE DRINK PER SURGEON ORDER  WHICH NEEDS TO BE COMPLETED AT  0430 am.    CLEAR LIQUID DIET   Foods Allowed                                                                     Foods Excluded  Coffee and tea, regular and decaf                             liquids that you cannot  Plain Jell-O any favor except red or purple                                           see through such as: Fruit ices (not with fruit pulp)                                     milk, soups, orange juice  Iced Popsicles                                    All solid food Carbonated beverages, regular and diet                                    Cranberry, grape and apple juices Sports drinks like Gatorade Lightly seasoned clear broth or consume(fat free) Sugar, honey syrup  Sample Menu Breakfast                                Lunch                                     Supper Cranberry juice  Beef broth                            Chicken broth Jell-O                                     Grape juice                           Apple juice Coffee or tea                        Jell-O                                       Popsicle                                                Coffee or tea                        Coffee or tea  _____________________________________________________________________    TAKE THESE MEDICATIONS MORNING OF SURGERY WITH A SIP OF WATER:   Levothyroxine (Synthroid), Megestrol (Megace)  IF YOU ARE SPENDING THE NIGHT AFTER SURGERY PLEASE BRING ALL YOUR PRESCRIPTION MEDICATIONS IN THEIR ORIGINAL BOTTLES.                                    DO NOT WEAR JEWERLY, MAKE UP, OR NAIL POLISH,  DO NOT WEAR LOTIONS, POWDERS, PERFUMES OR DEODORANT. DO NOT SHAVE FOR 24 HOURS PRIOR TO DAY OF SURGERY. . CONTACTS, GLASSES, OR DENTURES MAY NOT BE WORN TO SURGERY.                                    [facility] IS NOT RESPONSIBLE  FOR ANY BELONGINGS.                                                                    Marland Kitchen                                                                                                    [facility] - Preparing for Surgery Before surgery, you can play an important role.  Because skin is not sterile, your skin needs to be as free of germs as possible.  You  can reduce the number of germs on your skin by washing with CHG (chlorahexidine gluconate) soap before surgery.  CHG is an antiseptic cleaner which kills germs and bonds with the skin to continue killing germs even after washing. Please DO NOT use if you have an allergy to CHG or antibacterial soaps.  If your skin becomes reddened/irritated stop using the CHG and inform your nurse when you arrive at Short Stay. Do not shave (including legs and underarms) for at least 48 hours prior to the first CHG shower.  You may shave your face/neck. Please follow these instructions carefully:  1.  Shower with CHG Soap the night before surgery and the  morning of Surgery.  2.  If you choose to wash your hair, wash your hair first as usual with your  normal  shampoo.  3.  After you shampoo, rinse your hair and body thoroughly to remove the   shampoo.                           4.  Use CHG as you would any other liquid soap.  You can apply chg directly  to the skin and wash                       Gently with a scrungie or clean washcloth.  5.  Apply the CHG Soap to your body ONLY FROM THE NECK DOWN.   Do not use on face/ open                           Wound or open sores. Avoid contact with eyes, ears mouth and genitals (private parts).                       Wash face,  Genitals (private parts) with your normal soap.             6.  Wash thoroughly, paying special attention to the area where your surgery  will be performed.  7.  Thoroughly rinse your body with warm water from the neck down.  8.  DO NOT shower/wash with your normal soap after using and rinsing off  the CHG Soap.                9.  Pat yourself dry with a clean towel.            10.  Wear clean pajamas.            11.  Place clean sheets on your bed the night of your first shower and do not  sleep with pets. Day of Surgery : Do not apply any lotions/deodorants the morning of surgery.  Please wear clean clothes to the hospital/surgery center.  FAILURE TO FOLLOW THESE INSTRUCTIONS MAY RESULT IN THE CANCELLATION OF YOUR SURGERY PATIENT SIGNATURE_________________________________  NURSE SIGNATURE__________________________________  ________________________________________________________________________   Adam Phenix  An incentive spirometer is a tool that can help keep your lungs clear and active. This tool measures how well you are filling your lungs with each breath. Taking long deep breaths may help reverse or decrease the chance of developing breathing (pulmonary) problems (especially infection) following:  A long period of time when you are unable to move or be active. BEFORE THE PROCEDURE   If the spirometer includes an indicator to show your best effort, your nurse or respiratory therapist will set it  to a desired goal.  If possible, sit up straight  or lean slightly forward. Try not to slouch.  Hold the incentive spirometer in an upright position. INSTRUCTIONS FOR USE  1. Sit on the edge of your bed if possible, or sit up as far as you can in bed or on a chair. 2. Hold the incentive spirometer in an upright position. 3. Breathe out normally. 4. Place the mouthpiece in your mouth and seal your lips tightly around it. 5. Breathe in slowly and as deeply as possible, raising the piston or the ball toward the top of the column. 6. Hold your breath for 3-5 seconds or for as long as possible. Allow the piston or ball to fall to the bottom of the column. 7. Remove the mouthpiece from your mouth and breathe out normally. 8. Rest for a few seconds and repeat Steps 1 through 7 at least 10 times every 1-2 hours when you are awake. Take your time and take a few normal breaths between deep breaths. 9. The spirometer may include an indicator to show your best effort. Use the indicator as a goal to work toward during each repetition. 10. After each set of 10 deep breaths, practice coughing to be sure your lungs are clear. If you have an incision (the cut made at the time of surgery), support your incision when coughing by placing a pillow or rolled up towels firmly against it. Once you are able to get out of bed, walk around indoors and cough well. You may stop using the incentive spirometer when instructed by your caregiver.  RISKS AND COMPLICATIONS  Take your time so you do not get dizzy or light-headed.  If you are in pain, you may need to take or ask for pain medication before doing incentive spirometry. It is harder to take a deep breath if you are having pain. AFTER USE  Rest and breathe slowly and easily.  It can be helpful to keep track of a log of your progress. Your caregiver can provide you with a simple table to help with this. If you are using the spirometer at home, follow these instructions: [place] IF:   You are having  difficultly using the spirometer.  You have trouble using the spirometer as often as instructed.  Your pain medication is not giving enough relief while using the spirometer.  You develop fever of 100.5 F (38.1 C) or higher. SEEK IMMEDIATE MEDICAL CARE IF:   You cough up bloody sputum that had not been present before.  You develop fever of 102 F (38.9 C) or greater.  You develop worsening pain at or near the incision site. MAKE SURE YOU:   Understand these instructions.  Will watch your condition.  Will get help right away if you are not doing well or get worse. Document Released: 01/26/2007 Document Revised: 12/08/2011 Document Reviewed: 03/29/2007 Exit[employer] Patient Information 2014 Exit[employer], [place].   ________________________________________________________________________  WHAT IS A BLOOD TRANSFUSION? Blood Transfusion Information  A transfusion is the replacement of blood or some of its parts. Blood is made up of multiple cells which provide different functions.  Red blood cells carry oxygen and are used for blood loss replacement.  White blood cells fight against infection.  Platelets control bleeding.  Plasma helps clot blood.  Other blood products are available for specialized needs, such as hemophilia or other clotting disorders. BEFORE THE TRANSFUSION  Who gives blood for transfusions?   Healthy volunteers who are fully evaluated to  make sure their blood is safe. This is blood bank blood. Transfusion therapy is the safest it has ever been in the practice of medicine. Before blood is taken from a donor, a complete history is taken to make sure that person has no history of diseases nor engages in risky social behavior (examples are intravenous drug use or sexual activity with multiple partners). The donor's travel history is screened to minimize risk of transmitting infections, such as malaria. The donated blood is tested for signs of infectious diseases, such as  HIV and hepatitis. The blood is then tested to be sure it is compatible with you in order to minimize the chance of a transfusion reaction. If you or a relative donates blood, this is often done in anticipation of surgery and is not appropriate for emergency situations. It takes many days to process the donated blood. RISKS AND COMPLICATIONS Although transfusion therapy is very safe and saves many lives, the main dangers of transfusion include:   Getting an infectious disease.  Developing a transfusion reaction. This is an allergic reaction to something in the blood you were given. Every precaution is taken to prevent this. The decision to have a blood transfusion has been considered carefully by your caregiver before blood is given. Blood is not given unless the benefits outweigh the risks. AFTER THE TRANSFUSION  Right after receiving a blood transfusion, you will usually feel much better and more energetic. This is especially true if your red blood cells have gotten low (anemic). The transfusion raises the level of the red blood cells which carry oxygen, and this usually causes an energy increase.  The nurse administering the transfusion will monitor you carefully for complications. HOME CARE INSTRUCTIONS  No special instructions are needed after a transfusion. You may find your energy is better. Speak with your caregiver about any limitations on activity for underlying diseases you may have. SEEK MEDICAL CARE IF:   Your condition is not improving after your transfusion.  You develop redness or irritation at the intravenous (IV) site. SEEK IMMEDIATE MEDICAL CARE IF:  Any of the following symptoms occur over the next 12 hours:  Shaking chills.  You have a temperature by mouth above 102 F (38.9 C), not controlled by medicine.  Chest, back, or muscle pain.  People around you feel you are not acting correctly or are confused.  Shortness of breath or difficulty breathing.  Dizziness  and fainting.  You get a rash or develop hives.  You have a decrease in urine output.  Your urine turns a dark color or changes to pink, red, or brown. Any of the following symptoms occur over the next 10 days:  You have a temperature by mouth above 102 F (38.9 C), not controlled by medicine.  Shortness of breath.  Weakness after normal activity.  The white part of the eye turns yellow (jaundice).  You have a decrease in the amount of urine or are urinating less often.  Your urine turns a dark color or changes to pink, red, or brown. Document Released: 09/12/2000 Document Revised: 12/08/2011 Document Reviewed: 05/01/2008 [facility] Patient Information 2014 [place], [place].  _______________________________________________________________________

## 2019-05-20 ENCOUNTER — Other Ambulatory Visit: Payer: Self-pay

## 2019-05-20 ENCOUNTER — Encounter (HOSPITAL_COMMUNITY): Payer: Self-pay

## 2019-05-20 ENCOUNTER — Encounter (HOSPITAL_COMMUNITY)
Admission: RE | Admit: 2019-05-20 | Discharge: 2019-05-20 | Disposition: A | Discharge status: Home / Self Care | Payer: BC Managed Care – PPO | Source: Ambulatory Visit | Attending: Obstetrics & Gynecology | Admitting: Obstetrics & Gynecology

## 2019-05-20 ENCOUNTER — Other Ambulatory Visit (HOSPITAL_COMMUNITY)
Admission: RE | Admit: 2019-05-20 | Discharge: 2019-05-20 | Disposition: A | Discharge status: Home / Self Care | Payer: BC Managed Care – PPO | Source: Ambulatory Visit | Attending: Obstetrics & Gynecology | Admitting: Obstetrics & Gynecology

## 2019-05-20 DIAGNOSIS — Z20828 Contact with and (suspected) exposure to other viral communicable diseases: Secondary | ICD-10-CM | POA: Insufficient documentation

## 2019-05-20 DIAGNOSIS — Z01812 Encounter for preprocedural laboratory examination: Secondary | ICD-10-CM | POA: Diagnosis not present

## 2019-05-20 HISTORY — DX: Family history of other specified conditions: Z84.89

## 2019-05-20 LAB — CBC
HCT: 37.9 % (ref 36.0–46.0)
Hemoglobin: 10.8 g/dL — ABNORMAL LOW (ref 12.0–15.0)
MCH: 21.9 pg — ABNORMAL LOW (ref 26.0–34.0)
MCHC: 28.5 g/dL — ABNORMAL LOW (ref 30.0–36.0)
MCV: 76.9 fL — ABNORMAL LOW (ref 80.0–100.0)
Platelets: 301 10*3/uL (ref 150–400)
RBC: 4.93 MIL/uL (ref 3.87–5.11)
RDW: 24.6 % — ABNORMAL HIGH (ref 11.5–15.5)
WBC: 8.4 10*3/uL (ref 4.0–10.5)
nRBC: 0 % (ref 0.0–0.2)

## 2019-05-20 LAB — ABO/RH: ABO/RH(D): A POS

## 2019-05-20 LAB — SARS CORONAVIRUS 2 (TAT 6-24 HRS): SARS Coronavirus 2: NEGATIVE

## 2019-05-20 NOTE — Progress Notes (Signed)
Chart given to Konrad Felix, PA to review CBC results!

## 2019-05-23 ENCOUNTER — Encounter: Payer: Self-pay | Admitting: Obstetrics & Gynecology

## 2019-05-23 NOTE — Patient Instructions (Signed)
1. Fibroids Enlarged uterus with many fibroids, overall uterine size at 14.35 x 10.95 x 6.36 cm.  Refractory menometrorrhagia with severe secondary anemia not completely controlled on Megace.  Pelvic pain and discomfort worsening progressively.  Decision to proceed with an exercise robotic total laparoscopic hysterectomy with bilateral salpingectomy.  Will preserve the ovaries.  Preop management, surgery and risks thoroughly reviewed and postop precautions reviewed.  Risks of trauma to the bowels, trauma to the ureters, trauma to the bladder, trauma to blood vessels with the risk of requiring a blood transfusion reviewed with patient.  Risks of infection, DVT/pulmonary embolism and risks associated with anesthesia reviewed.  Patient voiced understanding and agreement with plan.  2. Menometrorrhagia As above  3. Secondary anemia Last Hb 10.8 05/20/19.  4. Left lower quadrant pain As above  Madison Tapia, it was a pleasure seeing you today!

## 2019-05-24 ENCOUNTER — Ambulatory Visit (HOSPITAL_BASED_OUTPATIENT_CLINIC_OR_DEPARTMENT_OTHER)
Admission: RE | Admit: 2019-05-24 | Discharge: 2019-05-25 | Disposition: A | Discharge status: Home / Self Care | Payer: BC Managed Care – PPO | Attending: Obstetrics & Gynecology | Admitting: Obstetrics & Gynecology

## 2019-05-24 ENCOUNTER — Encounter (HOSPITAL_BASED_OUTPATIENT_CLINIC_OR_DEPARTMENT_OTHER)
Admission: RE | Disposition: A | Discharge status: Home / Self Care | Payer: Self-pay | Source: Home / Self Care | Attending: Obstetrics & Gynecology

## 2019-05-24 ENCOUNTER — Ambulatory Visit (HOSPITAL_BASED_OUTPATIENT_CLINIC_OR_DEPARTMENT_OTHER): Payer: BC Managed Care – PPO | Admitting: Anesthesiology

## 2019-05-24 ENCOUNTER — Encounter (HOSPITAL_BASED_OUTPATIENT_CLINIC_OR_DEPARTMENT_OTHER): Payer: Self-pay

## 2019-05-24 DIAGNOSIS — N921 Excessive and frequent menstruation with irregular cycle: Secondary | ICD-10-CM | POA: Diagnosis not present

## 2019-05-24 DIAGNOSIS — N841 Polyp of cervix uteri: Secondary | ICD-10-CM | POA: Diagnosis not present

## 2019-05-24 DIAGNOSIS — Z882 Allergy status to sulfonamides status: Secondary | ICD-10-CM | POA: Insufficient documentation

## 2019-05-24 DIAGNOSIS — R102 Pelvic and perineal pain: Secondary | ICD-10-CM | POA: Diagnosis not present

## 2019-05-24 DIAGNOSIS — E039 Hypothyroidism, unspecified: Secondary | ICD-10-CM | POA: Insufficient documentation

## 2019-05-24 DIAGNOSIS — Z803 Family history of malignant neoplasm of breast: Secondary | ICD-10-CM | POA: Diagnosis not present

## 2019-05-24 DIAGNOSIS — Z818 Family history of other mental and behavioral disorders: Secondary | ICD-10-CM | POA: Insufficient documentation

## 2019-05-24 DIAGNOSIS — Z79899 Other long term (current) drug therapy: Secondary | ICD-10-CM | POA: Diagnosis not present

## 2019-05-24 DIAGNOSIS — D649 Anemia, unspecified: Secondary | ICD-10-CM | POA: Insufficient documentation

## 2019-05-24 DIAGNOSIS — Z833 Family history of diabetes mellitus: Secondary | ICD-10-CM | POA: Insufficient documentation

## 2019-05-24 DIAGNOSIS — Z809 Family history of malignant neoplasm, unspecified: Secondary | ICD-10-CM | POA: Diagnosis not present

## 2019-05-24 DIAGNOSIS — F329 Major depressive disorder, single episode, unspecified: Secondary | ICD-10-CM | POA: Diagnosis not present

## 2019-05-24 DIAGNOSIS — Z6841 Body Mass Index (BMI) 40.0 and over, adult: Secondary | ICD-10-CM | POA: Diagnosis not present

## 2019-05-24 DIAGNOSIS — F419 Anxiety disorder, unspecified: Secondary | ICD-10-CM | POA: Insufficient documentation

## 2019-05-24 DIAGNOSIS — F988 Other specified behavioral and emotional disorders with onset usually occurring in childhood and adolescence: Secondary | ICD-10-CM | POA: Diagnosis not present

## 2019-05-24 DIAGNOSIS — N840 Polyp of corpus uteri: Secondary | ICD-10-CM | POA: Diagnosis not present

## 2019-05-24 DIAGNOSIS — Z791 Long term (current) use of non-steroidal anti-inflammatories (NSAID): Secondary | ICD-10-CM | POA: Diagnosis not present

## 2019-05-24 DIAGNOSIS — N838 Other noninflammatory disorders of ovary, fallopian tube and broad ligament: Secondary | ICD-10-CM | POA: Diagnosis not present

## 2019-05-24 DIAGNOSIS — D259 Leiomyoma of uterus, unspecified: Secondary | ICD-10-CM

## 2019-05-24 DIAGNOSIS — N800 Endometriosis of uterus: Secondary | ICD-10-CM | POA: Diagnosis not present

## 2019-05-24 DIAGNOSIS — Z8249 Family history of ischemic heart disease and other diseases of the circulatory system: Secondary | ICD-10-CM | POA: Insufficient documentation

## 2019-05-24 DIAGNOSIS — G43909 Migraine, unspecified, not intractable, without status migrainosus: Secondary | ICD-10-CM | POA: Diagnosis not present

## 2019-05-24 DIAGNOSIS — Z87442 Personal history of urinary calculi: Secondary | ICD-10-CM | POA: Diagnosis not present

## 2019-05-24 DIAGNOSIS — Z5331 Laparoscopic surgical procedure converted to open procedure: Secondary | ICD-10-CM | POA: Diagnosis not present

## 2019-05-24 DIAGNOSIS — Z9889 Other specified postprocedural states: Secondary | ICD-10-CM

## 2019-05-24 DIAGNOSIS — Z85820 Personal history of malignant melanoma of skin: Secondary | ICD-10-CM | POA: Insufficient documentation

## 2019-05-24 DIAGNOSIS — Z8349 Family history of other endocrine, nutritional and metabolic diseases: Secondary | ICD-10-CM | POA: Diagnosis not present

## 2019-05-24 HISTORY — PX: ROBOTIC ASSISTED LAPAROSCOPIC HYSTERECTOMY AND SALPINGECTOMY: SHX6379

## 2019-05-24 LAB — TYPE AND SCREEN
ABO/RH(D): A POS
Antibody Screen: NEGATIVE

## 2019-05-24 SURGERY — XI ROBOTIC ASSISTED LAPAROSCOPIC HYSTERECTOMY AND SALPINGECTOMY
Anesthesia: General | Site: Abdomen | Laterality: Bilateral

## 2019-05-24 MED ORDER — LIDOCAINE 2% (20 MG/ML) 5 ML SYRINGE
INTRAMUSCULAR | Status: DC | PRN
  Administered 2019-05-24: 1.5 mg/kg/h via INTRAVENOUS

## 2019-05-24 MED ORDER — MIDAZOLAM HCL 2 MG/2ML IJ SOLN
INTRAMUSCULAR | Status: DC | PRN
  Administered 2019-05-24: 2 mg via INTRAVENOUS

## 2019-05-24 MED ORDER — BUPIVACAINE HCL (PF) 0.25 % IJ SOLN
INTRAMUSCULAR | Status: DC | PRN
  Administered 2019-05-24: 23 mL

## 2019-05-24 MED ORDER — KETOROLAC TROMETHAMINE 30 MG/ML IJ SOLN
30.0000 mg | Freq: Once | INTRAMUSCULAR | Status: DC | PRN
  Filled 2019-05-24: qty 1

## 2019-05-24 MED ORDER — LABETALOL HCL 5 MG/ML IV SOLN
INTRAVENOUS | Status: AC
  Filled 2019-05-24: qty 4

## 2019-05-24 MED ORDER — LABETALOL HCL 5 MG/ML IV SOLN
5.0000 mg | Freq: Once | INTRAVENOUS | Status: AC
  Administered 2019-05-24: 5 mg via INTRAVENOUS
  Filled 2019-05-24: qty 4

## 2019-05-24 MED ORDER — ROCURONIUM BROMIDE 10 MG/ML (PF) SYRINGE
PREFILLED_SYRINGE | INTRAVENOUS | Status: AC
  Filled 2019-05-24: qty 10

## 2019-05-24 MED ORDER — HYDROMORPHONE HCL 1 MG/ML IJ SOLN
0.5000 mg | INTRAMUSCULAR | Status: DC | PRN
  Administered 2019-05-24: 0.5 mg via INTRAVENOUS
  Filled 2019-05-24: qty 0.5

## 2019-05-24 MED ORDER — ONDANSETRON HCL 4 MG/2ML IJ SOLN
INTRAMUSCULAR | Status: AC
  Filled 2019-05-24: qty 2

## 2019-05-24 MED ORDER — LIDOCAINE 2% (20 MG/ML) 5 ML SYRINGE
INTRAMUSCULAR | Status: AC
  Filled 2019-05-24: qty 5

## 2019-05-24 MED ORDER — PROPOFOL 10 MG/ML IV BOLUS
INTRAVENOUS | Status: DC | PRN
  Administered 2019-05-24: 50 mg via INTRAVENOUS
  Administered 2019-05-24: 250 mg via INTRAVENOUS

## 2019-05-24 MED ORDER — LABETALOL HCL 5 MG/ML IV SOLN
15.0000 mg | Freq: Once | INTRAVENOUS | Status: AC
  Administered 2019-05-24: 15 mg via INTRAVENOUS
  Filled 2019-05-24: qty 4

## 2019-05-24 MED ORDER — KETOROLAC TROMETHAMINE 30 MG/ML IJ SOLN
INTRAMUSCULAR | Status: AC
  Filled 2019-05-24: qty 1

## 2019-05-24 MED ORDER — ONDANSETRON HCL 4 MG/2ML IJ SOLN
INTRAMUSCULAR | Status: DC | PRN
  Administered 2019-05-24: 4 mg via INTRAVENOUS

## 2019-05-24 MED ORDER — CEFAZOLIN SODIUM-DEXTROSE 2-4 GM/100ML-% IV SOLN
INTRAVENOUS | Status: AC
  Filled 2019-05-24: qty 100

## 2019-05-24 MED ORDER — DEXAMETHASONE SODIUM PHOSPHATE 10 MG/ML IJ SOLN
INTRAMUSCULAR | Status: AC
  Filled 2019-05-24: qty 1

## 2019-05-24 MED ORDER — LACTATED RINGERS IV SOLN
INTRAVENOUS | Status: DC
  Administered 2019-05-25: 02:00:00 via INTRAVENOUS
  Filled 2019-05-24 (×2): qty 1000

## 2019-05-24 MED ORDER — SUGAMMADEX SODIUM 200 MG/2ML IV SOLN
INTRAVENOUS | Status: DC | PRN
  Administered 2019-05-24: 280 mg via INTRAVENOUS

## 2019-05-24 MED ORDER — OXYCODONE-ACETAMINOPHEN 5-325 MG PO TABS
ORAL_TABLET | ORAL | Status: AC
  Filled 2019-05-24: qty 1

## 2019-05-24 MED ORDER — PROPOFOL 10 MG/ML IV BOLUS
INTRAVENOUS | Status: AC
  Filled 2019-05-24: qty 20

## 2019-05-24 MED ORDER — DEXTROSE 5 % IV SOLN
3.0000 g | Freq: Once | INTRAVENOUS | Status: AC
  Administered 2019-05-24: 3 g via INTRAVENOUS
  Filled 2019-05-24: qty 3000

## 2019-05-24 MED ORDER — PROMETHAZINE HCL 25 MG/ML IJ SOLN
6.2500 mg | INTRAMUSCULAR | Status: DC | PRN
  Filled 2019-05-24: qty 1

## 2019-05-24 MED ORDER — MEPERIDINE HCL 25 MG/ML IJ SOLN
6.2500 mg | INTRAMUSCULAR | Status: DC | PRN
  Filled 2019-05-24: qty 1

## 2019-05-24 MED ORDER — MIDAZOLAM HCL 2 MG/2ML IJ SOLN
INTRAMUSCULAR | Status: AC
  Filled 2019-05-24: qty 2

## 2019-05-24 MED ORDER — HYDROMORPHONE HCL 2 MG PO TABS
1.0000 mg | ORAL_TABLET | ORAL | Status: DC | PRN
  Administered 2019-05-25: 1 mg via ORAL
  Filled 2019-05-24: qty 1

## 2019-05-24 MED ORDER — FENTANYL CITRATE (PF) 100 MCG/2ML IJ SOLN
INTRAMUSCULAR | Status: AC
  Filled 2019-05-24: qty 2

## 2019-05-24 MED ORDER — CEFAZOLIN SODIUM-DEXTROSE 1-4 GM/50ML-% IV SOLN
INTRAVENOUS | Status: AC
  Filled 2019-05-24: qty 50

## 2019-05-24 MED ORDER — ACETAMINOPHEN 500 MG PO TABS
ORAL_TABLET | ORAL | Status: AC
  Filled 2019-05-24: qty 2

## 2019-05-24 MED ORDER — LACTATED RINGERS IV SOLN
INTRAVENOUS | Status: DC
  Administered 2019-05-24 (×2): via INTRAVENOUS
  Filled 2019-05-24: qty 1000

## 2019-05-24 MED ORDER — HYDROMORPHONE HCL 1 MG/ML IJ SOLN
0.2500 mg | INTRAMUSCULAR | Status: DC | PRN
  Administered 2019-05-24: 0.5 mg via INTRAVENOUS
  Administered 2019-05-24 (×2): 0.25 mg via INTRAVENOUS
  Filled 2019-05-24: qty 0.5

## 2019-05-24 MED ORDER — DEXAMETHASONE SODIUM PHOSPHATE 10 MG/ML IJ SOLN
INTRAMUSCULAR | Status: DC | PRN
  Administered 2019-05-24: 10 mg via INTRAVENOUS

## 2019-05-24 MED ORDER — SCOPOLAMINE 1 MG/3DAYS TD PT72
MEDICATED_PATCH | TRANSDERMAL | Status: AC
  Filled 2019-05-24: qty 1

## 2019-05-24 MED ORDER — ROCURONIUM BROMIDE 10 MG/ML (PF) SYRINGE
PREFILLED_SYRINGE | INTRAVENOUS | Status: DC | PRN
  Administered 2019-05-24: 70 mg via INTRAVENOUS
  Administered 2019-05-24: 10 mg via INTRAVENOUS
  Administered 2019-05-24: 20 mg via INTRAVENOUS

## 2019-05-24 MED ORDER — HYDROMORPHONE HCL 1 MG/ML IJ SOLN
INTRAMUSCULAR | Status: AC
  Filled 2019-05-24: qty 1

## 2019-05-24 MED ORDER — FENTANYL CITRATE (PF) 250 MCG/5ML IJ SOLN
INTRAMUSCULAR | Status: AC
  Filled 2019-05-24: qty 5

## 2019-05-24 MED ORDER — FENTANYL CITRATE (PF) 100 MCG/2ML IJ SOLN
INTRAMUSCULAR | Status: DC | PRN
  Administered 2019-05-24 (×3): 50 ug via INTRAVENOUS
  Administered 2019-05-24: 100 ug via INTRAVENOUS
  Administered 2019-05-24 (×4): 50 ug via INTRAVENOUS

## 2019-05-24 MED ORDER — ACETAMINOPHEN 500 MG PO TABS
1000.0000 mg | ORAL_TABLET | ORAL | Status: AC
  Administered 2019-05-24: 1000 mg via ORAL
  Filled 2019-05-24: qty 2

## 2019-05-24 MED ORDER — CEFAZOLIN SODIUM-DEXTROSE 2-4 GM/100ML-% IV SOLN
2.0000 g | INTRAVENOUS | Status: DC
  Filled 2019-05-24: qty 100

## 2019-05-24 MED ORDER — ACETAMINOPHEN 325 MG PO TABS
650.0000 mg | ORAL_TABLET | ORAL | Status: DC | PRN
  Filled 2019-05-24: qty 2

## 2019-05-24 MED ORDER — SUGAMMADEX SODIUM 500 MG/5ML IV SOLN
INTRAVENOUS | Status: AC
  Filled 2019-05-24: qty 5

## 2019-05-24 MED ORDER — LACTATED RINGERS IV SOLN
INTRAVENOUS | Status: DC
  Filled 2019-05-24: qty 1000

## 2019-05-24 MED ORDER — KETOROLAC TROMETHAMINE 30 MG/ML IJ SOLN
INTRAMUSCULAR | Status: DC | PRN
  Administered 2019-05-24: 30 mg via INTRAVENOUS

## 2019-05-24 MED ORDER — OXYCODONE-ACETAMINOPHEN 5-325 MG PO TABS
1.0000 | ORAL_TABLET | ORAL | Status: DC | PRN
  Administered 2019-05-24: 1 via ORAL
  Administered 2019-05-24: 2 via ORAL
  Administered 2019-05-24: 1 via ORAL
  Administered 2019-05-25 (×2): 2 via ORAL
  Filled 2019-05-24: qty 2

## 2019-05-24 MED ORDER — OXYCODONE-ACETAMINOPHEN 5-325 MG PO TABS
ORAL_TABLET | ORAL | Status: AC
  Filled 2019-05-24: qty 2

## 2019-05-24 MED ORDER — LIDOCAINE 2% (20 MG/ML) 5 ML SYRINGE
INTRAMUSCULAR | Status: DC | PRN
  Administered 2019-05-24: 100 mg via INTRAVENOUS

## 2019-05-24 SURGICAL SUPPLY — 74 items
ADH SKN CLS APL DERMABOND .7 (GAUZE/BANDAGES/DRESSINGS) ×2
BARRIER ADHS 3X4 INTERCEED (GAUZE/BANDAGES/DRESSINGS) IMPLANT
BLADE EXTENDED COATED 6.5IN (ELECTRODE) ×1 IMPLANT
BRR ADH 4X3 ABS CNTRL BYND (GAUZE/BANDAGES/DRESSINGS)
CANISTER SUCT 3000ML PPV (MISCELLANEOUS) ×2 IMPLANT
CATH FOLEY 3WAY  5CC 16FR (CATHETERS) ×1
CATH FOLEY 3WAY 5CC 16FR (CATHETERS) ×1 IMPLANT
COUNTER NEEDLE 1200 MAGNETIC (NEEDLE) ×2 IMPLANT
COVER BACK TABLE 60X90IN (DRAPES) ×2 IMPLANT
COVER TIP SHEARS 8 DVNC (MISCELLANEOUS) ×1 IMPLANT
COVER TIP SHEARS 8MM DA VINCI (MISCELLANEOUS) ×1
DECANTER SPIKE VIAL GLASS SM (MISCELLANEOUS) ×4 IMPLANT
DEFOGGER SCOPE WARMER CLEARIFY (MISCELLANEOUS) ×2 IMPLANT
DERMABOND ADVANCED (GAUZE/BANDAGES/DRESSINGS) ×2
DERMABOND ADVANCED .7 DNX12 (GAUZE/BANDAGES/DRESSINGS) ×1 IMPLANT
DRAPE ARM DVNC X/XI (DISPOSABLE) ×4 IMPLANT
DRAPE COLUMN DVNC XI (DISPOSABLE) ×1 IMPLANT
DRAPE DA VINCI XI ARM (DISPOSABLE) ×4
DRAPE DA VINCI XI COLUMN (DISPOSABLE) ×1
DRAPE INCISE IOBAN 66X45 STRL (DRAPES) ×1 IMPLANT
DRAPE WARM FLUID 44X44 (DRAPES) ×1 IMPLANT
DRSG OPSITE POSTOP 4X10 (GAUZE/BANDAGES/DRESSINGS) ×2 IMPLANT
DURAPREP 26ML APPLICATOR (WOUND CARE) ×2 IMPLANT
ELECT REM PT RETURN 9FT ADLT (ELECTROSURGICAL) ×2
ELECTRODE REM PT RTRN 9FT ADLT (ELECTROSURGICAL) ×1 IMPLANT
GAUZE PETROLATUM 1 X8 (GAUZE/BANDAGES/DRESSINGS) ×2 IMPLANT
GLOVE BIO SURGEON STRL SZ 6.5 (GLOVE) ×6 IMPLANT
GLOVE BIOGEL PI IND STRL 7.0 (GLOVE) ×5 IMPLANT
GLOVE BIOGEL PI INDICATOR 7.0 (GLOVE) ×5
HEMOSTAT ARISTA ABSORB 3G PWDR (HEMOSTASIS) ×1 IMPLANT
IRRIG SUCT STRYKERFLOW 2 WTIP (MISCELLANEOUS) ×2
IRRIGATION SUCT STRKRFLW 2 WTP (MISCELLANEOUS) ×1 IMPLANT
LEGGING LITHOTOMY PAIR STRL (DRAPES) ×2 IMPLANT
LIGASURE IMPACT 36 18CM CVD LR (INSTRUMENTS) ×1 IMPLANT
OBTURATOR OPTICAL STANDARD 8MM (TROCAR) ×1
OBTURATOR OPTICAL STND 8 DVNC (TROCAR) ×1
OBTURATOR OPTICALSTD 8 DVNC (TROCAR) ×1 IMPLANT
OCCLUDER COLPOPNEUMO (BALLOONS) ×2 IMPLANT
PACK ROBOT WH (CUSTOM PROCEDURE TRAY) ×2 IMPLANT
PACK ROBOTIC GOWN (GOWN DISPOSABLE) ×2 IMPLANT
PACK TRENDGUARD 450 HYBRID PRO (MISCELLANEOUS) IMPLANT
PAD PREP 24X48 CUFFED NSTRL (MISCELLANEOUS) ×2 IMPLANT
POUCH ENDO CATCH II 15MM (MISCELLANEOUS) IMPLANT
PROTECTOR NERVE ULNAR (MISCELLANEOUS) ×4 IMPLANT
RTRCTR C-SECT PINK 34CM XLRG (MISCELLANEOUS) ×1 IMPLANT
RTRCTR WOUND ALEXIS 18CM SML (INSTRUMENTS)
SAVER CELL AAL HAEMONETICS (INSTRUMENTS) IMPLANT
SEAL CANN UNIV 5-8 DVNC XI (MISCELLANEOUS) ×3 IMPLANT
SEAL XI 5MM-8MM UNIVERSAL (MISCELLANEOUS) ×3
SET CYSTO W/LG BORE CLAMP LF (SET/KITS/TRAYS/PACK) IMPLANT
SPONGE LAP 18X18 RF (DISPOSABLE) ×3 IMPLANT
SUT ETHIBOND 0 (SUTURE) IMPLANT
SUT PLAIN 2 0 XLH (SUTURE) IMPLANT
SUT VIC AB 0 CT1 18XCR BRD8 (SUTURE) IMPLANT
SUT VIC AB 0 CT1 27 (SUTURE) ×2
SUT VIC AB 0 CT1 27XBRD ANBCTR (SUTURE) IMPLANT
SUT VIC AB 0 CT1 8-18 (SUTURE) ×6
SUT VIC AB 4-0 PS2 27 (SUTURE) ×7 IMPLANT
SUT VICRYL 0 UR6 27IN ABS (SUTURE) ×2 IMPLANT
SUT VLOC 180 0 9IN  GS21 (SUTURE) ×1
SUT VLOC 180 0 9IN GS21 (SUTURE) ×1 IMPLANT
SUT VLOC 180 2-0 6IN GS21 (SUTURE) IMPLANT
TIP RUMI ORANGE 6.7MMX12CM (TIP) IMPLANT
TIP UTERINE 5.1X6CM LAV DISP (MISCELLANEOUS) IMPLANT
TIP UTERINE 6.7X10CM GRN DISP (MISCELLANEOUS) ×1 IMPLANT
TIP UTERINE 6.7X6CM WHT DISP (MISCELLANEOUS) IMPLANT
TIP UTERINE 6.7X8CM BLUE DISP (MISCELLANEOUS) IMPLANT
TOWEL OR 17X26 10 PK STRL BLUE (TOWEL DISPOSABLE) ×4 IMPLANT
TRENDGUARD 450 HYBRID PRO PACK (MISCELLANEOUS)
TROCAR HASSON GELL 12X100 (TROCAR) IMPLANT
TROCAR PORT AIRSEAL 5X120 (TROCAR) ×2 IMPLANT
TUBING EVAC SMOKE HEATED PNEUM (TUBING) ×2 IMPLANT
WATER STERILE IRR 1000ML POUR (IV SOLUTION) ×2 IMPLANT
YANKAUER SUCT BULB TIP NO VENT (SUCTIONS) ×1 IMPLANT

## 2019-05-24 NOTE — Anesthesia Preprocedure Evaluation (Signed)
Anesthesia Evaluation  Patient identified by MRN, date of birth, ID band Patient awake    Reviewed: Allergy & Precautions, NPO status , Patient's Chart, lab work & pertinent test results  History of Anesthesia Complications (+) Family history of anesthesia reaction  Airway Mallampati: III       Dental no notable dental hx. (+) Teeth Intact   Pulmonary neg pulmonary ROS,    Pulmonary exam normal breath sounds clear to auscultation       Cardiovascular negative cardio ROS Normal cardiovascular exam Rhythm:Regular Rate:Normal     Neuro/Psych  Headaches, PSYCHIATRIC DISORDERS Anxiety Depression    GI/Hepatic negative GI ROS, Neg liver ROS,   Endo/Other  Hypothyroidism Morbid obesity  Renal/GU negative Renal ROS  negative genitourinary   Musculoskeletal negative musculoskeletal ROS (+)   Abdominal (+) + obese,   Peds negative pediatric ROS (+)  Hematology   Anesthesia Other Findings   Reproductive/Obstetrics negative OB ROS                             Anesthesia Physical Anesthesia Plan  ASA: III  Anesthesia Plan: General   Post-op Pain Management:    Induction: Intravenous  PONV Risk Score and Plan: 4 or greater and Ondansetron, Dexamethasone, Midazolam and Scopolamine patch - Pre-op  Airway Management Planned: Oral ETT  Additional Equipment: None  Intra-op Plan:   Post-operative Plan: Extubation in OR  Informed Consent: I have reviewed the patients History and Physical, chart, labs and discussed the procedure including the risks, benefits and alternatives for the proposed anesthesia with the patient or authorized representative who has indicated his/her understanding and acceptance.     Dental advisory given  Plan Discussed with: CRNA  Anesthesia Plan Comments:         Anesthesia Quick Evaluation

## 2019-05-24 NOTE — Anesthesia Postprocedure Evaluation (Signed)
Anesthesia Post Note  Patient: Madison Tapia  Procedure(s) Performed: ATTEMPTED XI ROBOTIC ASSISTED TOTAL LAPAROSCOPIC HYSTERECTOMY AND SALPINGECTOMY, CONVERTED TO OPEN  PROEDURE: TOTAL  ABDOMINAL HYSTERECTOMY WITH BILATERAL SALPINGECTOMY (Bilateral Abdomen)     Patient location during evaluation: PACU Anesthesia Type: General Level of consciousness: sedated Pain management: pain level controlled Vital Signs Assessment: post-procedure vital signs reviewed and stable Respiratory status: spontaneous breathing Cardiovascular status: stable Postop Assessment: no apparent nausea or vomiting Anesthetic complications: no    Last Vitals:  Vitals:   05/24/19 1145 05/24/19 1200  BP: (!) 174/97 (!) 170/96  Pulse: 83 81  Resp: 19 18  Temp:    SpO2: 98% 96%    Last Pain:  Vitals:   05/24/19 1145  TempSrc:   PainSc: Asleep   Pain Goal: Patients Stated Pain Goal: 4 (05/24/19 1141)                 Huston Foley

## 2019-05-24 NOTE — Transfer of Care (Signed)
Immediate Anesthesia Transfer of Care Note  Patient: Madison Tapia  Procedure(s) Performed: Procedure(s) (LRB): ATTEMPTED XI ROBOTIC ASSISTED TOTAL LAPAROSCOPIC HYSTERECTOMY AND SALPINGECTOMY, CONVERTED TO OPEN  PROEDURE: TOTAL  ABDOMINAL HYSTERECTOMY WITH BILATERAL SALPINGECTOMY (Bilateral)  Patient Location: PACU  Anesthesia Type: General  Level of Consciousness: awake, oriented, sedated and patient cooperative  Airway & Oxygen Therapy: Patient Spontanous Breathing and Patient connected to face mask oxygen  Post-op Assessment: Report given to PACU RN and Post -op Vital signs reviewed and stable  Post vital signs: Reviewed and stable  Complications: No apparent anesthesia complications  Last Vitals:  Vitals Value Taken Time  BP 202/108 05/24/19 1100  Temp    Pulse 104 05/24/19 1108  Resp 21 05/24/19 1108  SpO2 97 % 05/24/19 1108  Vitals shown include unvalidated device data.  Last Pain:  Vitals:   05/24/19 1051  TempSrc:   PainSc: Asleep      Patients Stated Pain Goal: 5 (05/24/19 0550)

## 2019-05-24 NOTE — Op Note (Signed)
Operative Note  05/24/2019  10:20 AM  PATIENT:  Madison Tapia  48 y.o. female  PRE-OPERATIVE DIAGNOSIS:  Menometrorrhagia, fibroids, pelvic pain, morbid obesity  POST-OPERATIVE DIAGNOSIS:  Menometrorrhagia, fibroids, pelvic pain, morbid obesity  PROCEDURE:  Procedure(s): ATTEMPTED XI ROBOTIC ASSISTED TOTAL LAPAROSCOPIC HYSTERECTOMY AND SALPINGECTOMY, CONVERTED TO OPEN PROCEDURE TOTAL ABDOMINAL HYSTERECTOMY WITH BILATERAL SALPINGECTOMY  SURGEON:  Surgeon(s): Princess Bruins, MD Fontaine, Belinda Block, MD  ANESTHESIA:   general  FINDINGS: Enlarged uterus with fibroids, normal tubes, normal ovaries.    DESCRIPTION OF OPERATION: Under general anesthesia with endotracheal intubation the patient is in lithotomy position.  She is prepped with DuraPrep on the abdomen and with Hibiclens on the suprapubic, vulvar and vaginal areas.  She is draped as usual.  Timeout is done.  The Foley is put in place in the bladder.  A #10 Rumi with a medium Koh ring are put in place at the cervix and in the uterus.  The other instruments are removed.  The supraumbilical area is infiltrated with Marcaine one quarter plain.  A 2 cm incision is made with a scalpel.  The aponeurosis is grasped with Coker's and section with Mayo scissors under direct vision.  The parietal peritoneum is grasped with hemostats and opened with Mayo scissors under direct vision.  A pursestring stitch of Vicryl 0 is done on the aponeurosis.  The Sheryle Hail is inserted at that level and a pneumoperitoneum is created.  The camera is inserted.  The skin is marked with a pen for port placement.  Infiltration of Marcaine one quarter plain at each site.  Small incisions made with a scalpel at each site.  3 robotic ports are inserted under direct vision, 2 on the right and one at the external left.  An 8 mm assistant port is inserted at the left proximal site under direct vision as well.  The patient is placed in deep Trendelenburg which she does not  tolerate as she is desaturating and oxygen.  She can barely tolerate 20 degrees of Trendelenburg which does not allow the bowels to move away from the pelvis.  Decision was therefore made to convert to a laparotomy approach.  All ports were removed.  The pursestring stitch at the umbilical incision was attached.  All skin incisions were closed with separate stitches or subcuticular stitches of Vicryl 4-0.  Dermabond was added on each incision.  The instruments in the vagina were removed.  The patient was repositioned in a flat decubitus dorsal position.      A long Pfannenstiel incision was made with a scalpel.  The adipose tissue was opened with the electrocautery.  The layer of adipose tissue was more than 6 inches.  The aponeurosis was opened transversely with the electrocautery and the Mayo scissors.  The parietal peritoneum was opened longitudinally with Metzenbaums scissors.  The aponeurosis was separated from the recti muscles on the midline superiorly and inferiorly.  We used a large pink and orange Alexis retractor which was put in place easily.  4 laps were added to retract the bowels superiorly.  The uterus was very enlarged with many uterine fibroids, both tubes and both ovaries were normal.  We started on the left side sectioning the left round ligament with the electrocautery.  Then opening the visceral peritoneum anteriorly to the midline.  We then used LigaSure to cauterize and section the left utero-ovarian ligament.  We then cauterized and section the left mesosalpinx with the LigaSure.  We proceeded the same way on the  right side.  We then went down either side of the uterus at the left and then right broad ligament cauterizing and sectioning.  We then lowered the bladder past the end of the vagina with a 4 x 4 sponge.  The right uterine artery was cauterized and section with LigaSure.  On the left side we proceeded with a myomectomy as we reached the left uterine artery and cauterized and  sectioned it.  We then use the scalpel to cut the uterus at the junction of the uterus and cervix to improve visualization.  We then continued with straight Heaney clamps on each side of the cervix clamping, sectioning with the scalpel and suturing with Vicryl 0.  We reached the angle of the vagina.  We used curved Heaneys to clamp, then section and suture with Vicryl 0.  Those angle sutures were kept on a hemostat.  We finished removing the cervix with scissors at the upper aspect of the vagina.  The specimen consisting of the uterus with cervix and both tubes was sent to pathology.  The vagina was closed with figure-of-eight's of Vicryl 0.  Hemostasis was verified and completed where needed.  All laps and the Alexis retractor were removed.  The aponeurosis was closed with 2 half running sutures of Vicryl 0.  The adipose tissue was reapproximated with separate stitches of plain 2-0.  The skin was closed with a subcuticular stitch of Vicryl 4-0 and Dermabond was added.  A honeycomb dressing was put in place.  The patient was brought to recovery room in good and stable status.   ESTIMATED BLOOD LOSS: 100 mL   Intake/Output Summary (Last 24 hours) at 05/24/2019 1020 Last data filed at 05/24/2019 1015 Gross per 24 hour  Intake 600 ml  Output 100 ml  Net 500 ml     BLOOD ADMINISTERED:none   LOCAL MEDICATIONS USED:  MARCAINE     SPECIMEN:  Source of Specimen:  Uterus with cervix and bilateral tubes  DISPOSITION OF SPECIMEN:  PATHOLOGY  COUNTS:  YES  PLAN OF CARE: Transfer to PACU  Marie-Lyne LavoieMD10:20 AM

## 2019-05-24 NOTE — Anesthesia Procedure Notes (Signed)
Procedure Name: Intubation Date/Time: 05/24/2019 7:38 AM Performed by: Suan Halter, CRNA Pre-anesthesia Checklist: Patient identified, Emergency Drugs available, Suction available and Patient being monitored Patient Re-evaluated:Patient Re-evaluated prior to induction Oxygen Delivery Method: Circle system utilized Preoxygenation: Pre-oxygenation with 100% oxygen Induction Type: IV induction Ventilation: Mask ventilation without difficulty Laryngoscope Size: Mac and 3 Grade View: Grade I Tube type: Oral Tube size: 7.0 mm Number of attempts: 1 Airway Equipment and Method: Stylet and Oral airway Placement Confirmation: ETT inserted through vocal cords under direct vision,  positive ETCO2 and breath sounds checked- equal and bilateral Secured at: 22 cm Tube secured with: Tape Dental Injury: Teeth and Oropharynx as per pre-operative assessment

## 2019-05-24 NOTE — H&P (Signed)
Madison Tapia is an 48 y.o. female.  G1P1L1  RP: XI Robotic TLH, Bilateral Salpingectomy for Fibroids/menometro/anemia/pelvic pain  HPI: Uterine Fibroids with pain and Menometro, severe secondary anemia currently mildly improved on Megace and after IV iron.  Last Hb 10.8 on 05/20/19.     Pertinent Gynecological History: Menses: Menometrorrhagia Contraception: none Blood transfusions: none Sexually transmitted diseases: no past history Last mammogram: normal  Last pap: normal    Menstrual History: Patient's last menstrual period was 04/26/2019 (approximate).    Past Medical History:  Diagnosis Date  . ADD 07/14/2007  . ANXIETY 06/24/2007  . COMMON MIGRAINE 06/24/2007  . DEPRESSION 06/24/2007  . Family history of adverse reaction to anesthesia    family -extreme nausea, hallucinations (mother)  . Headache(784.0) 10/08/2007  . HX, URINARY INFECTION 06/24/2007  . IRRITABLE BOWEL SYNDROME, HX OF 06/24/2007  . MALAISE AND FATIGUE 06/24/2007  . 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 Unspecified hypothyroidism 07/14/2007    Past Surgical History:  Procedure Laterality Date  . KIDNEY STONE SURGERY     basket extraction-age 81  . melanoma surgery  2018   from back    Family History  Problem Relation Age of Onset  . Cancer Mother   . Thyroid disease Mother   . Breast cancer Mother 55  . Diabetes Father   . Hypertension Father   . Mental illness Father   . Depression Neg Hx        family hx    Social History:  reports that she has never smoked. She has never used smokeless tobacco. She reports current alcohol use. She reports that she does not use drugs.  Allergies:  Allergies  Allergen Reactions  . Sulfa Antibiotics Hives  . Sulfonamide Derivatives Hives    Medications Prior to Admission  Medication Sig Dispense Refill Last Dose  . cyanocobalamin (,VITAMIN B-12,) 1000 MCG/ML injection Inject 1 mL (1,000 mcg total) into the muscle every 30 (thirty) days. 1 mL 11  Past Month at Unknown time  . diphenhydrAMINE (BENADRYL) 25 MG tablet Take 25 mg by mouth daily as needed for allergies.   05/22/2019  . escitalopram (LEXAPRO) 5 MG tablet Take 1 tablet (5 mg total) by mouth daily. (Patient taking differently: Take 5 mg by mouth daily at 3 pm. ) 30 tablet 2 05/23/2019 at Unknown time  . ferrous sulfate 325 (65 FE) MG tablet Take 325 mg by mouth 3 (three) times daily with meals.   05/23/2019 at Unknown time  . ibuprofen (ADVIL) 200 MG tablet Take 400-800 mg by mouth every 6 (six) hours as needed for headache or moderate pain.   05/22/2019  . levothyroxine (SYNTHROID, LEVOTHROID) 200 MCG tablet TAKE 1 TABLET BY MOUTH ONCE DAILY BEFORE BREAKFAST (Patient taking differently: Take 200 mcg by mouth daily. ) 90 tablet 1 05/24/2019 at 0400  . magnesium hydroxide (DULCOLAX) 400 MG/5ML suspension Take 5 mLs by mouth daily as needed for mild constipation.   Past Month at Unknown time  . megestrol (MEGACE) 40 MG tablet Take 1 tablet (40 mg total) by mouth 2 (two) times daily. 30 tablet 0 05/23/2019 at Unknown time  . SYRINGE/NEEDLE, DISP, 1 ML (B-D SYRINGE/NEEDLE 1CC/25GX5/8) 25G X 5/8" 1 ML MISC Use for B12 injections 100 each 3 Past Month at Unknown time  . Vitamin D, Ergocalciferol, (DRISDOL) 1.25 MG (50000 UT) CAPS capsule Take 50,000 Units by mouth every Sunday.   05/22/2019  . amphetamine-dextroamphetamine (ADDERALL) 20 MG tablet Take 1 tablet (20 mg total)  by mouth daily. 30 tablet 0 Not Taking at Unknown time    REVIEW OF SYSTEMS: A ROS was performed and pertinent positives and negatives are included in the history.  GENERAL: No fevers or chills. HEENT: No change in vision, no earache, sore throat or sinus congestion. NECK: No pain or stiffness. CARDIOVASCULAR: No chest pain or pressure. No palpitations. PULMONARY: No shortness of breath, cough or wheeze. GASTROINTESTINAL: No abdominal pain, nausea, vomiting or diarrhea, melena or bright red blood per rectum. GENITOURINARY: No  urinary frequency, urgency, hesitancy or dysuria. MUSCULOSKELETAL: No joint or muscle pain, no back pain, no recent trauma. DERMATOLOGIC: No rash, no itching, no lesions. ENDOCRINE: No polyuria, polydipsia, no heat or cold intolerance. No recent change in weight. HEMATOLOGICAL: No anemia or easy bruising or bleeding. NEUROLOGIC: No headache, seizures, numbness, tingling or weakness. PSYCHIATRIC: No depression, no loss of interest in normal activity or change in sleep pattern.     Blood pressure (!) 160/89, pulse 92, temperature 98.2 F (36.8 C), temperature source Oral, resp. rate 18, height 5\' 7"  (1.702 m), weight (!) 138.8 kg, last menstrual period 04/26/2019, SpO2 100 %.  Physical Exam:  See office notes  [facility] Hysterogram ( procedure note)   The initial transvaginal ultrasound demonstrated the following: Comparison with previous scan on March 24, 2019: Unchangedenlarged uterus measured at 14.35 x 10.95 x 6.36 cm with a 5 cm pedunculated fibroid to the left.Bilateral ovaries with multiple small follicles are stable. The endometrial lining still appears irregular and has increased in thickness measured at 25.6 mm today. The speculum was inserted and the cervix cleansed with Betadine solution after confirming that patient has no allergies.A small sonohysterography catheterwas utilized. Insertion was facilitated with ring forceps, using a spear-like motion the catheter was inserted to the fundus of the uterus. The speculum is then removed carefully to avoid dislodging the catheter. The catheter was flushed with sterile saline delete prior to insertion to rid it of small amounts of air.the sterile saline solution was infused into the uterine cavity as a vaginal ultrasound probe was then placed in the vagina for full visualization of the uterine cavity from a transvaginal approach. The following was noted: The intrauterine cavity is filled with saline which reveals several  intracavitary masses measured at 2.2 cm, 2.0 cm, and 1.7 cm. The cavity length is 8 to 9 cm. The catheter was then removed after retrieving some of the saline from the intrauterine cavity. An endometrial biopsywas notdone. Patient tolerated procedure well. She had received a tablet of Aleve for discomfort.    Assessment/Plan:  48 y.o. G1P1   1. Fibroids Enlarged uterus with many fibroids, overall uterine size at 14.35 x 10.95 x 6.36 cm.  Refractory menometrorrhagia with severe secondary anemia not completely controlled on Megace.  Pelvic pain and discomfort worsening progressively.  Decision to proceed with an exercise robotic total laparoscopic hysterectomy with bilateral salpingectomy.  Will preserve the ovaries.  Preop management, surgery and risks thoroughly reviewed and postop precautions reviewed.  Risks of trauma to the bowels, trauma to the ureters, trauma to the bladder, trauma to blood vessels with the risk of requiring a blood transfusion reviewed with patient.  Risks of infection, DVT/pulmonary embolism and risks associated with anesthesia reviewed.  Patient voiced understanding and agreement with plan.  2. Menometrorrhagia As above  3. Secondary anemia Last Hb 10.8 05/20/19.  4. Left lower quadrant pain As above  Marie-Lyne Jeremi Losito 05/24/2019, 6:49 AM

## 2019-05-25 ENCOUNTER — Encounter (HOSPITAL_BASED_OUTPATIENT_CLINIC_OR_DEPARTMENT_OTHER): Payer: Self-pay | Admitting: Obstetrics & Gynecology

## 2019-05-25 DIAGNOSIS — G43909 Migraine, unspecified, not intractable, without status migrainosus: Secondary | ICD-10-CM | POA: Diagnosis not present

## 2019-05-25 DIAGNOSIS — F988 Other specified behavioral and emotional disorders with onset usually occurring in childhood and adolescence: Secondary | ICD-10-CM | POA: Diagnosis not present

## 2019-05-25 DIAGNOSIS — N841 Polyp of cervix uteri: Secondary | ICD-10-CM | POA: Diagnosis not present

## 2019-05-25 DIAGNOSIS — Z5331 Laparoscopic surgical procedure converted to open procedure: Secondary | ICD-10-CM | POA: Diagnosis not present

## 2019-05-25 DIAGNOSIS — Z87442 Personal history of urinary calculi: Secondary | ICD-10-CM | POA: Diagnosis not present

## 2019-05-25 DIAGNOSIS — E039 Hypothyroidism, unspecified: Secondary | ICD-10-CM | POA: Diagnosis not present

## 2019-05-25 DIAGNOSIS — D259 Leiomyoma of uterus, unspecified: Secondary | ICD-10-CM | POA: Diagnosis not present

## 2019-05-25 DIAGNOSIS — R102 Pelvic and perineal pain: Secondary | ICD-10-CM | POA: Diagnosis not present

## 2019-05-25 DIAGNOSIS — N921 Excessive and frequent menstruation with irregular cycle: Secondary | ICD-10-CM | POA: Diagnosis not present

## 2019-05-25 DIAGNOSIS — Z6841 Body Mass Index (BMI) 40.0 and over, adult: Secondary | ICD-10-CM | POA: Diagnosis not present

## 2019-05-25 DIAGNOSIS — N800 Endometriosis of uterus: Secondary | ICD-10-CM | POA: Diagnosis not present

## 2019-05-25 DIAGNOSIS — F329 Major depressive disorder, single episode, unspecified: Secondary | ICD-10-CM | POA: Diagnosis not present

## 2019-05-25 DIAGNOSIS — F419 Anxiety disorder, unspecified: Secondary | ICD-10-CM | POA: Diagnosis not present

## 2019-05-25 DIAGNOSIS — D649 Anemia, unspecified: Secondary | ICD-10-CM | POA: Diagnosis not present

## 2019-05-25 DIAGNOSIS — N838 Other noninflammatory disorders of ovary, fallopian tube and broad ligament: Secondary | ICD-10-CM | POA: Diagnosis not present

## 2019-05-25 LAB — CBC
HCT: 32.1 % — ABNORMAL LOW (ref 36.0–46.0)
Hemoglobin: 9.5 g/dL — ABNORMAL LOW (ref 12.0–15.0)
MCH: 22.8 pg — ABNORMAL LOW (ref 26.0–34.0)
MCHC: 29.6 g/dL — ABNORMAL LOW (ref 30.0–36.0)
MCV: 77 fL — ABNORMAL LOW (ref 80.0–100.0)
Platelets: 295 10*3/uL (ref 150–400)
RBC: 4.17 MIL/uL (ref 3.87–5.11)
RDW: 23.9 % — ABNORMAL HIGH (ref 11.5–15.5)
WBC: 10.7 10*3/uL — ABNORMAL HIGH (ref 4.0–10.5)
nRBC: 0 % (ref 0.0–0.2)

## 2019-05-25 MED ORDER — OXYCODONE-ACETAMINOPHEN 5-325 MG PO TABS
ORAL_TABLET | ORAL | Status: AC
  Filled 2019-05-25: qty 2

## 2019-05-25 MED ORDER — OXYCODONE-ACETAMINOPHEN 7.5-325 MG PO TABS: 1.0000 | ORAL_TABLET | ORAL | 0 refills | Status: DC | PRN

## 2019-05-25 MED ORDER — HYDROMORPHONE HCL 2 MG PO TABS
ORAL_TABLET | ORAL | Status: AC
  Filled 2019-05-25: qty 1

## 2019-05-25 NOTE — Progress Notes (Signed)
POD#1 TAH/Bilateral Salpingectomy  Subjective: Patient reports tolerating PO, + flatus and no problems voiding.    Objective: I have reviewed patient's vital signs.  vital signs, intake and output, medications and labs.  Vitals:   05/25/19 0541 05/25/19 0801  BP: 140/65 137/77  Pulse: 96 86  Resp: 18 18  Temp: 98.5 F (36.9 C) 98.6 F (37 C)  SpO2: 98% 99%   I/O last 3 completed shifts: In: 46 [P.O.:580; I.V.:3075; IV Piggyback:50] Out: 1050 [Urine:950; Blood:100] No intake/output data recorded.  Results for orders placed or performed during the hospital encounter of 05/24/19 (from the past 24 hour(s))  CBC     Status: Abnormal   Collection Time: 05/25/19  5:08 AM  Result Value Ref Range   WBC 10.7 (H) 4.0 - 10.5 K/uL   RBC 4.17 3.87 - 5.11 MIL/uL   Hemoglobin 9.5 (L) 12.0 - 15.0 g/dL   HCT 32.1 (L) 36.0 - 46.0 %   MCV 77.0 (L) 80.0 - 100.0 fL   MCH 22.8 (L) 26.0 - 34.0 pg   MCHC 29.6 (L) 30.0 - 36.0 g/dL   RDW 23.9 (H) 11.5 - 15.5 %   Platelets 295 150 - 400 K/uL   nRBC 0.0 0.0 - 0.2 %    EXAM General: alert and cooperative Resp: clear to auscultation bilaterally Cardio: regular rate and rhythm GI: soft, non-tender; bowel sounds normal; no masses,  no organomegaly and incision: clean, dry and intact Extremities: no edema, redness or tenderness in the calves or thighs Vaginal Bleeding: none  Assessment: s/p Procedure(s): ATTEMPTED XI ROBOTIC ASSISTED TOTAL LAPAROSCOPIC HYSTERECTOMY AND SALPINGECTOMY, CONVERTED TO OPEN  PROEDURE: TOTAL  ABDOMINAL HYSTERECTOMY WITH BILATERAL SALPINGECTOMY: stable, progressing well and tolerating diet  Plan: Discharge home  LOS: 0 days    Princess Bruins, MD 05/25/2019 8:46 AM

## 2019-05-25 NOTE — Discharge Summary (Signed)
Physician Discharge Summary  Patient ID: Madison Tapia MRN: [id number] DOB/AGE: 10-27-1970 48 y.o.  Admit date: 05/24/2019 Discharge date: 05/25/2019  Admission Diagnoses: menometrorrhagia, fibroids, pelvic pain   Discharge Diagnoses: Same Active Problems:   Post-operative state   Discharged Condition: good  Consults:None  Significant Diagnostic Studies: labs: Hb 9.5 postop  Treatments: Total Abdominal Hysterectomy with Bilateral Salpingectomy  Vitals:   05/25/19 0541 05/25/19 0801  BP: 140/65 137/77  Pulse: 96 86  Resp: 18 18  Temp: 98.5 F (36.9 C) 98.6 F (37 C)  SpO2: 98% 99%     No intake/output data recorded.   Hospital Course: Good  Discharge Exam: Normal  Disposition: D/C home     Allergies as of 05/25/2019      Reactions   Sulfa Antibiotics Hives   Sulfonamide Derivatives Hives      Medication List    STOP taking these medications   megestrol 40 MG tablet Commonly known as: MEGACE     TAKE these medications   amphetamine-dextroamphetamine 20 MG tablet Commonly known as: Adderall Take 1 tablet (20 mg total) by mouth daily.   cyanocobalamin 1000 MCG/ML injection Commonly known as: (VITAMIN B-12) Inject 1 mL (1,000 mcg total) into the muscle every 30 (thirty) days.   diphenhydrAMINE 25 MG tablet Commonly known as: BENADRYL Take 25 mg by mouth daily as needed for allergies.   Dulcolax 400 MG/5ML suspension Generic drug: magnesium hydroxide Take 5 mLs by mouth daily as needed for mild constipation.   escitalopram 5 MG tablet Commonly known as: Lexapro Take 1 tablet (5 mg total) by mouth daily. What changed: when to take this   ferrous sulfate 325 (65 FE) MG tablet Take 325 mg by mouth 3 (three) times daily with meals.   ibuprofen 200 MG tablet Commonly known as: ADVIL Take 400-800 mg by mouth every 6 (six) hours as needed for headache or moderate pain.   levothyroxine 200 MCG tablet Commonly known as: SYNTHROID TAKE 1  TABLET BY MOUTH ONCE DAILY BEFORE BREAKFAST What changed:   how much to take  how to take this  when to take this  additional instructions   oxyCODONE-acetaminophen 7.5-325 MG tablet Commonly known as: Percocet Take 1 tablet by mouth every 4 (four) hours as needed for severe pain.   SYRINGE/NEEDLE (DISP) 1 ML 25G X 5/8" 1 ML Misc Commonly known as: B-D SYRINGE/NEEDLE 1CC/25GX5/8 Use for B12 injections   Vitamin D (Ergocalciferol) 1.25 MG (50000 UT) Caps capsule Commonly known as: DRISDOL Take 50,000 Units by mouth every Sunday.        Follow-up Information    Princess Bruins, MD Follow up in 3 week(s).   Specialty: Obstetrics and Gynecology Contact information: [address] [place] [address] [phone]            Signed: Princess Bruins 05/25/2019, 8:51 AM

## 2019-05-25 NOTE — Discharge Instructions (Signed)
Abdominal Hysterectomy, Care After °This sheet gives you information about how to care for yourself after your procedure. Your health care provider may also give you more specific instructions. If you have problems or questions, contact your health care provider. °What can I expect after the procedure? °After your procedure, it is common to have: °· Pain. °· Fatigue. °· Poor appetite. °· Less interest in sex. °· Vaginal bleeding and discharge. You may need to use a sanitary napkin after this procedure. °Follow these instructions at home: °Bathing °· Do not take baths, swim, or use a hot tub until your health care provider approves. Ask your health care provider if you can take showers. You may only be allowed to take sponge baths for bathing. °· Keep the bandage (dressing) dry until your health care provider says it can be removed. °Incision care ° °· Follow instructions from your health care provider about how to take care of your incision. Make sure you: °? Wash your hands with soap and water before you change your bandage (dressing). If soap and water are not available, use hand sanitizer. °? Change your dressing as told by your health care provider. °? Leave stitches (sutures), skin glue, or adhesive strips in place. These skin closures may need to stay in place for 2 weeks or longer. If adhesive strip edges start to loosen and curl up, you may trim the loose edges. Do not remove adhesive strips completely unless your health care provider tells you to do that. °· Check your incision area every day for signs of infection. Check for: °? Redness, swelling, or pain. °? Fluid or blood. °? Warmth. °? Pus or a bad smell. °Activity °· Do gentle, daily exercises as told by your health care provider. You may be told to take short walks every day and go farther each time. °· Do not lift anything that is heavier than 10 lb (4.5 kg), or the limit that your health care provider tells you, until he or she says that it is  safe. °· Do not drive or use heavy machinery while taking prescription pain medicine. °· Do not drive for 24 hours if you were given a medicine to help you relax (sedative). °· Follow your health care provider's instructions about exercise, driving, and general activities. Ask your health care provider what activities are safe for you. °Lifestyle °· Do not douche, use tampons, or have sex for at least 6 weeks or as told by your health care provider. °· Do not drink alcohol until your health care provider approves. °· Drink enough fluid to keep your urine clear or pale yellow. °· Try to have someone at home with you for the first 1-2 weeks to help. °· Do not use any products that contain nicotine or tobacco, such as cigarettes and e-cigarettes. These can delay healing. If you need help quitting, ask your health care provider. °General instructions °· Take over-the-counter and prescription medicines only as told by your health care provider. °· Do not take aspirin or ibuprofen. These medicines can cause bleeding. °· To prevent or treat constipation while you are taking prescription pain medicine, your health care provider may recommend that you: °? Drink enough fluid to keep your urine clear or pale yellow. °? Take over-the-counter or prescription medicines. °? Eat foods that are high in fiber, such as fresh fruits and vegetables, whole grains, and beans. °? Limit foods that are high in fat and processed sugars, such as fried and sweet foods. °· Keep all   follow-up visits as told by your health care provider. This is important. Contact a health care provider if:  You have chills or fever.  You have redness, swelling, or pain around your incision.  You have fluid or blood coming from your incision.  Your incision feels warm to the touch.  You have pus or a bad smell coming from your incision.  Your incision breaks open.  You feel dizzy or light-headed.  You have pain or bleeding when you urinate.  You  have persistent diarrhea.  You have persistent nausea and vomiting.  You have abnormal vaginal discharge.  You have a rash.  You have any type of abnormal reaction or you develop an allergy to your medicine.  Your pain medicine does not help. Get help right away if:  You have a fever and your symptoms suddenly get worse.  You have severe abdominal pain.  You have shortness of breath.  You faint.  You have pain, swelling, or redness in your leg.  You have heavy vaginal bleeding with blood clots. Summary  After your procedure, it is common to have pain, fatigue and vaginal discharge.  Do not take baths, swim, or use a hot tub until your health care provider approves. Ask your health care provider if you can take showers. You may only be allowed to take sponge baths for bathing.  Follow your health care provider's instructions about exercise, driving, and general activities. Ask your health care provider what activities are safe for you.  Do not lift anything that is heavier than 10 lb (4.5 kg), or the limit that your health care provider tells you, until he or she says that it is safe.  Try to have someone at home with you for the first 1-2 weeks to help. This information is not intended to replace advice given to you by your health care provider. Make sure you discuss any questions you have with your health care provider. Document Released: 04/04/2005 Document Revised: 10/19/2018 Document Reviewed: 09/03/2016 Elsevier Patient Education  [employer]. Abdominal Hysterectomy, Care After This sheet gives you information about how to care for yourself after your procedure. Your health care provider may also give you more specific instructions. If you have problems or questions, contact your health care provider. What can I expect after the procedure? After the procedure, it is common to have:  Pain.  Tiredness (fatigue).  Poor appetite.  Lowered interest in  sex.  Bleeding and discharge from your vagina. You may need to use a pad in your underwear after this procedure. Follow these instructions at home: Medicines  Take over-the-counter and prescription medicines only as told by your doctor.  Do not take aspirin or ibuprofen. These medicines can cause bleeding.  Ask your doctor if the medicine prescribed to you: ? Requires you to avoid driving or using heavy machinery. ? Can cause trouble pooping (constipation). You may need to take these actions to prevent or treat trouble pooping:  Take over-the-counter or prescription medicines.  Eat foods that are high in fiber. These include beans, whole grains, and fresh fruits and vegetables.  Limit foods that are high in fat and processed sugars. These include fried or sweet foods. Surgical cut (incision) care      Follow instructions from your doctor about how to take care of your cut from surgery (incision). Make sure you: ? Wash your hands with soap and water before and after you change your bandage (dressing). If you cannot use soap and  water, use hand sanitizer. ? Change your bandage as told by your doctor. ? Leave stitches (sutures), skin glue, or skin tape (adhesive) strips in place. They may need to stay in place for 2 weeks or longer. If tape strips get loose and curl up, you may trim the loose edges. Do not remove tape strips completely unless your doctor says it is okay.  Check your cut from surgery every day for signs of infection. Check for: ? Redness, swelling, or pain. ? Fluid or blood. ? Warmth. ? Pus or a bad smell. Activity  Rest as told by your doctor. ? Do not sit for a long time without moving. Get up to take short walks every 1-2 hours. This is important. Ask for help if you feel weak or unsteady.  Do not lift anything that is heavier than 10 lb (4.5 kg), or the limit that you are told, until your doctor says that it is safe.  Do not drive or use heavy machinery while  taking prescription pain medicine.  Follow your doctor's advice about exercise, driving, and general activities. Return to your normal activities as told by your doctor. Ask your doctor what activities are safe for you. Lifestyle  Do not douche, use tampons, or have sex for at least 6 weeks or as told by your doctor.  Do not drink alcohol until your doctor says it is okay.  Do not use any products that contain nicotine or tobacco, such as cigarettes, e-cigarettes, and chewing tobacco. If you need help quitting, ask your doctor. General instructions   Drink enough fluid to keep your pee (urine) pale yellow.  Do not take baths, swim, or use a hot tub until your doctor approves. Ask your doctor if you may take showers. You may only be allowed to take sponge baths.  Try to have someone at home with you for the first 1-2 weeks to help you with your daily chores at home.  Keep the bandage dry until your doctor says it can be taken off.  Wear tight-fitting (compression) stockings as told by your health care provider. These stockings help to prevent blood clots and reduce swelling in your legs.  Keep all follow-up visits as told by your doctor. This is important. Contact a doctor if:  You have any of these signs of infection: ? Redness, swelling, or pain around your cut. ? Fluid or blood coming from your cut. ? Warmth coming from your cut. ? Pus or a bad smell coming from your cut. ? Chills or a fever.  Your cut breaks open.  You feel dizzy or light-headed.  You have pain or bleeding when you pee.  You keep having watery poop (diarrhea).  You keep feeling like you may vomit (nauseous) or keep vomiting.  You have unusual fluid (discharge) coming from your vagina.  You have a rash.  You have any type of reaction to your medicine that is not normal, or you develop an allergy to your medicine.  Your pain medicine does not help. Get help right away if:  You have a fever and  your symptoms get worse all of a sudden.  You have very bad pain in your belly (abdomen).  You are short of breath.  You faint.  You have pain, swelling, or redness of your leg.  You bleed a lot from your vagina and notice clumps of blood (clots). Summary  Do not take baths, swim, or use a hot tub until your doctor approves. Ask  your doctor if you may take showers. You may only be allowed to take sponge baths.  Do not lift anything that is heavier than 10 lb (4.5 kg), or the limit that you are told, until your doctor says that it is safe.  Follow your doctor's advice about exercise, driving, and general activities. Ask your doctor what activities are safe for you.  Try to have someone at home with you for the first 1-2 weeks to help you with your daily chores at home. This information is not intended to replace advice given to you by your health care provider. Make sure you discuss any questions you have with your health care provider. Document Released: 06/24/2008 Document Revised: 11/18/2018 Document Reviewed: 09/03/2016 Elsevier Patient Education  2020 [employer]. Hysterectomy Information  A hysterectomy is a surgery in which the uterus is removed. The fallopian tubes and ovaries may be removed (bilateral salpingo-oophorectomy) as well. This procedure may be done to treat various medical problems. After the procedure, a woman will no longer have menstrual periods nor will she be able to become pregnant (sterile). What are the reasons for a hysterectomy? There are many reasons why a woman might have this procedure. They include:  Persistent, abnormal vaginal bleeding.  Long-term (chronic) pelvic pain or infection.  Endometriosis. This is when the lining of the uterus (endometrium) starts to grow outside the uterus.  Adenomyosis. This is when the endometrium starts to grow in the muscle of the uterus.  Pelvic organ prolapse. This is a condition in which the uterus falls down  into the vagina.  Noncancerous growths in the uterus (uterine fibroids) that cause symptoms.  The presence of precancerous cells.  Cervical or uterine cancer. What are the different types of hysterectomy? There are three different types of hysterectomy:  Supracervical hysterectomy. In this type, the top part of the uterus is removed, but not the cervix.  Total hysterectomy. In this type, the uterus and cervix are removed.  Radical hysterectomy. In this type, the uterus, the cervix, and the tissue that holds the uterus in place (parametrium) are removed. What are the different ways a hysterectomy can be performed? There are many different ways a hysterectomy can be performed, including:  Abdominal hysterectomy. In this type, an incision is made in the abdomen. The uterus is removed through this incision.  Vaginal hysterectomy. In this type, an incision is made in the vagina. The uterus is removed through this incision. There are no abdominal incisions.  Conventional laparoscopic hysterectomy. In this type, three or four small incisions are made in the abdomen. A thin, lighted tube with a camera (laparoscope) is inserted into one of the incisions. Other tools are put through the other incisions. The uterus is cut into small pieces. The small pieces are removed through the incisions or through the vagina.  Laparoscopically assisted vaginal hysterectomy (LAVH). In this type, three or four small incisions are made in the abdomen. Part of the surgery is performed laparoscopically and the other part is done vaginally. The uterus is removed through the vagina.  Robot-assisted laparoscopic hysterectomy. In this type, a laparoscope and other tools are inserted into three or four small incisions in the abdomen. A computer-controlled device is used to give the surgeon a 3D image and to help control the surgical instruments. This allows for more precise movements of surgical instruments. The uterus is  cut into small pieces and removed through the incisions or removed through the vagina. Discuss the options with your health care  provider to determine which type is the right one for you. What are the risks? Generally, this is a safe procedure. However, problems may occur, including:  Bleeding and risk of blood transfusion. Tell your health care provider if you do not want to receive any blood products.  Blood clots in the legs or lung.  Infection.  Damage to other structures or organs.  Allergic reactions to medicines.  Changing to an abdominal hysterectomy from one of the other techniques. What to expect after a hysterectomy  You will be given pain medicine.  You may need to stay in the hospital for 1- 2 days to recover, depending on the type of hysterectomy you had.  Follow your health care provider's instructions about exercise, driving, and general activities. Ask your health care provider what activities are safe for you.  You will need to have someone with you for the first 3-5 days after you go home.  You will need to follow up with your surgeon in 2-4 weeks after surgery to evaluate your progress.  If the ovaries are removed, you will have early menopause symptoms such as hot flashes, night sweats, and insomnia.  If you had a hysterectomy for a problem that was not cancer or not a condition that could lead to cancer, then you no longer need Pap tests. However, even if you no longer need a Pap test, a regular pelvic exam is a good idea to make sure no other problems are developing. Questions to ask your health care provider  Is a hysterectomy medically necessary? Do I have other treatment options for my condition?  What are my options for hysterectomy procedure?  What organs and tissues need to be removed?  What are the risks?  What are the benefits?  How long will I need to stay in the hospital after the procedure?  How long will I need to recover at  home?  What symptoms can I expect after the procedure? Summary  A hysterectomy is a surgery in which the uterus is removed. The fallopian tubes and ovaries may be removed (bilateral salpingo-oophorectomy) as well.  This procedure may be done to treat various medical problems. After the procedure, a woman will no longer have menstrual periods nor will she be able to become pregnant.  Discuss the options with your health care provider to determine which type of hysterectomy is the right one for you. This information is not intended to replace advice given to you by your health care provider. Make sure you discuss any questions you have with your health care provider. Document Released: 03/11/2001 Document Revised: 08/28/2017 Document Reviewed: 10/22/2016 Elsevier Patient Education  2020 [employer].

## 2019-05-26 ENCOUNTER — Encounter: Payer: Self-pay | Admitting: *Deleted

## 2019-05-30 ENCOUNTER — Telehealth: Payer: Self-pay | Admitting: *Deleted

## 2019-05-30 NOTE — Telephone Encounter (Signed)
Patient called to follow up post surgery on 05/24/19. Patient said for 2 days she has noticed right leg numbness which feels like a burning sensation. When walking the sensation goes away, leg is not hot to touch, no swelling, no discoloration. Also noticed a productive cough, coughs up yellowish mucus, low grade temp x 2 nights 100.0, this am her temperature was 99.0. has not had any pain medication in the last 3 days, did take a pill this am, alternating from tylenol and ibuprofen as needed for pain. Patient wanted to know you thoughts if this could be normal after surgery? Please advise

## 2019-05-30 NOTE — Telephone Encounter (Signed)
Schedule visit with me tomorrow or Wednesday to assess and investigate.

## 2019-05-31 ENCOUNTER — Other Ambulatory Visit: Payer: Self-pay

## 2019-05-31 NOTE — Telephone Encounter (Signed)
Patient informed scheduled on 06/01/19

## 2019-06-01 ENCOUNTER — Ambulatory Visit (INDEPENDENT_AMBULATORY_CARE_PROVIDER_SITE_OTHER): Payer: BC Managed Care – PPO | Admitting: Obstetrics & Gynecology

## 2019-06-01 ENCOUNTER — Encounter: Payer: Self-pay | Admitting: Obstetrics & Gynecology

## 2019-06-01 ENCOUNTER — Other Ambulatory Visit (INDEPENDENT_AMBULATORY_CARE_PROVIDER_SITE_OTHER): Payer: BC Managed Care – PPO

## 2019-06-01 ENCOUNTER — Telehealth: Payer: Self-pay | Admitting: *Deleted

## 2019-06-01 ENCOUNTER — Other Ambulatory Visit: Payer: Self-pay

## 2019-06-01 VITALS — BP 136/84 | Temp 98.5°F

## 2019-06-01 DIAGNOSIS — E559 Vitamin D deficiency, unspecified: Secondary | ICD-10-CM

## 2019-06-01 DIAGNOSIS — Z9889 Other specified postprocedural states: Secondary | ICD-10-CM

## 2019-06-01 DIAGNOSIS — R200 Anesthesia of skin: Secondary | ICD-10-CM

## 2019-06-01 LAB — VITAMIN D 25 HYDROXY (VIT D DEFICIENCY, FRACTURES): VITD: 26.28 ng/mL — ABNORMAL LOW (ref 30.00–100.00)

## 2019-06-01 NOTE — Telephone Encounter (Signed)
-----   Message from Princess Bruins, MD sent at 06/01/2019 12:48 PM EDT ----- Regarding: Schedule Venous Doppler of right lower limb on Friday am Postop TAH x 1 week.  Subfebrile with Rt leg numbness, questionable swelling.

## 2019-06-01 NOTE — Patient Instructions (Signed)
1. Post-operative state Given the postoperative state x1 week with unilateral numbness of the right thigh anteriorly, decision to proceed with a right lower limb venous Doppler to rule out thrombosis.  Recommend laying down on the left side to reduce possible compression on the right.  Afebrile with a temperature of 98.5.  Lungs clear bilaterally.  No symptoms compatible with COVID and no known exposure.  2. Right leg numbness Probable mild nerve compression.  Mild numbness of the right anterior thigh.  No other neurologic symptoms on will observe at this time.  Madison Tapia, it was a pleasure seeing you today!

## 2019-06-01 NOTE — Progress Notes (Signed)
    Madison Tapia 1971-02-17 [id number]        48 y.o.  G1P1L1  RP: Rt thigh numbness and subfebrile state 1 week post op TAH  HPI: Complains of right thigh numbness with a mild increase in her temperature at 99-100 for the past 2 days.  Also had a productive cough.  No shortness of breath.  No other neurologic symptoms in the lower limbs.  No loss of strength.  Able to walk.  Does not feel swollen in the legs and both legs are the same size.   OB History  Gravida Para Term Preterm AB Living  1 1       1   SAB TAB Ectopic Multiple Live Births               # Outcome Date GA Lbr Len/2nd Weight Sex Delivery Anes PTL Lv  1 Para             Past medical history,surgical history, problem list, medications, allergies, family history and social history were all reviewed and documented in the EPIC chart.   Directed ROS with pertinent positives and negatives documented in the history of present illness/assessment and plan.  Exam:  Vitals:   06/01/19 1222  BP: 136/84  Temp: 98.5 F (36.9 C)   General appearance:  Normal  Lungs clear bilaterally with good air entry and no adventitious sound. Heart with regular cardiac rhythm.  Abdomen: Normal.  Incisions healing very well, closed.  Mild induration a lower aspect of the pfannenstiel incision.  No tenderness, no LN felt at inguinal areas bilaterally.  Gynecologic exam: Deferred  Lower limbs: Per patient, numbness of the right thigh from the knee to the hip anteriorly.  Normal strength bilaterally.  Walking normally.  Both legs with good pulses.  No edema with both legs of similar size.  No tenderness and no increase in warmth.  No rash and no redness/erythema.   Assessment/Plan:  48 y.o. G1P1   1. Post-operative state Given the postoperative state x1 week with unilateral numbness of the right thigh anteriorly, decision to proceed with a right lower limb venous Doppler to rule out thrombosis.  Afebrile with a temperature of  98.5.  Lungs clear bilaterally.  No symptoms compatible with COVID and no known exposure.  2. Right leg numbness Probable mild nerve compression.  Mild numbness of the right anterior thigh.  No other neurologic symptoms on will observe at this time.  Counseling on above issues and coordination of care more than 50% for 15 minutes.  Princess Bruins MD, 8:24 PM 06/01/2019

## 2019-06-01 NOTE — Telephone Encounter (Signed)
Patient scheduled at [facility] hospital 06/03/19 @ 10:00am. Patient informed.

## 2019-06-02 ENCOUNTER — Other Ambulatory Visit: Payer: Self-pay | Admitting: Internal Medicine

## 2019-06-02 DIAGNOSIS — E559 Vitamin D deficiency, unspecified: Secondary | ICD-10-CM

## 2019-06-02 MED ORDER — VITAMIN D (ERGOCALCIFEROL) 1.25 MG (50000 UNIT) PO CAPS: 50000.0000 [IU] | ORAL_CAPSULE | ORAL | 0 refills | Status: AC

## 2019-06-03 ENCOUNTER — Ambulatory Visit (HOSPITAL_COMMUNITY)
Admission: RE | Admit: 2019-06-03 | Discharge: 2019-06-03 | Disposition: A | Discharge status: Home / Self Care | Payer: BC Managed Care – PPO | Source: Ambulatory Visit | Attending: Obstetrics & Gynecology | Admitting: Obstetrics & Gynecology

## 2019-06-03 ENCOUNTER — Other Ambulatory Visit: Payer: Self-pay

## 2019-06-03 DIAGNOSIS — R200 Anesthesia of skin: Secondary | ICD-10-CM | POA: Diagnosis not present

## 2019-06-03 NOTE — Progress Notes (Signed)
Lower extremity venous has been completed.   Preliminary results in CV Proc.   Madison Tapia 06/03/2019 10:21 AM

## 2019-06-14 ENCOUNTER — Other Ambulatory Visit: Payer: Self-pay

## 2019-06-15 ENCOUNTER — Encounter: Payer: Self-pay | Admitting: Obstetrics & Gynecology

## 2019-06-15 ENCOUNTER — Ambulatory Visit (INDEPENDENT_AMBULATORY_CARE_PROVIDER_SITE_OTHER): Payer: BC Managed Care – PPO | Admitting: Obstetrics & Gynecology

## 2019-06-15 VITALS — BP 132/84

## 2019-06-15 DIAGNOSIS — Z09 Encounter for follow-up examination after completed treatment for conditions other than malignant neoplasm: Secondary | ICD-10-CM

## 2019-06-15 DIAGNOSIS — Z23 Encounter for immunization: Secondary | ICD-10-CM

## 2019-06-15 NOTE — Progress Notes (Signed)
    Madison Tapia December 03, 1970 [id number]        48 y.o.  G1P1L1   RP: TAH/Bilateral Salpingectomy 05/24/19  HPI: Much improved since last visit.  Doppler of right lower limb was negative.  Mild numbness at the right thigh but no other neurologic symptom.  No abdominal pelvic pain.  Incisions all closed.  No vaginal discharge or bleeding.  No fever.  Urine and bowel movements normal.   OB History  Gravida Para Term Preterm AB Living  1 1       1   SAB TAB Ectopic Multiple Live Births               # Outcome Date GA Lbr Len/2nd Weight Sex Delivery Anes PTL Lv  1 Para             Past medical history,surgical history, problem list, medications, allergies, family history and social history were all reviewed and documented in the EPIC chart.   Directed ROS with pertinent positives and negatives documented in the history of present illness/assessment and plan.  Exam:  Vitals:   06/15/19 0843  BP: 132/84   General appearance:  Normal  Abdomen: Incisions all closed.  No erythema or induration.  Abdomen soft, nontender.  Gynecologic exam: Vulva normal.  Speculum exam: Vaginal vault well closed.  Normal vaginal secretions.  No bleeding.  Diagnosis  Uterus, cervix and bilateral fallopian tubes  - CERVIX: TUNNEL CLUSTERS.  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  - MYOMETRIUM: ADENOMYOSIS.  - LEIOMYOMA.  - SEROSA: UNREMARKABLE.  - BILATERAL FALLOPIAN TUBES: PARATUBAL CYST(S).   Assessment/Plan:  48 y.o. G1P1   1. Status post gynecological surgery, follow-up exam Good postop evolution with no complication.  Postop precautions reviewed.  Avoid abdominal pressure until 6 weeks postop.  No sexual activities until 8 weeks postop.  Follow-up in 4 weeks.  2. Flu vaccine need Flu shot given.  Other orders - Flu Vaccine QUAD 36+ mos IM (Fluarix, Quad PF)  Princess Bruins MD, 8:58 AM 06/15/2019

## 2019-06-15 NOTE — Patient Instructions (Signed)
1. Status post gynecological surgery, follow-up exam Good postop evolution with no complication.  Postop precautions reviewed.  Avoid abdominal pressure until 6 weeks postop.  No sexual activities until 8 weeks postop.  Follow-up in 4 weeks.  2. Flu vaccine need Flu shot given.  Other orders - Flu Vaccine QUAD 36+ mos IM (Fluarix, Quad PF)  Madison Tapia, it was a pleasure seeing you today!

## 2019-07-06 ENCOUNTER — Other Ambulatory Visit: Payer: Self-pay | Admitting: Internal Medicine

## 2019-07-06 DIAGNOSIS — F339 Major depressive disorder, recurrent, unspecified: Secondary | ICD-10-CM

## 2019-07-06 DIAGNOSIS — Z0000 Encounter for general adult medical examination without abnormal findings: Secondary | ICD-10-CM

## 2019-07-06 NOTE — Telephone Encounter (Signed)
Rx refill request routed to PCP to approve Lexapro

## 2019-07-13 ENCOUNTER — Ambulatory Visit: Payer: BC Managed Care – PPO | Admitting: Obstetrics & Gynecology

## 2019-07-13 ENCOUNTER — Other Ambulatory Visit: Payer: Self-pay

## 2019-07-13 ENCOUNTER — Telehealth (INDEPENDENT_AMBULATORY_CARE_PROVIDER_SITE_OTHER): Payer: BC Managed Care – PPO | Admitting: Internal Medicine

## 2019-07-13 DIAGNOSIS — D500 Iron deficiency anemia secondary to blood loss (chronic): Secondary | ICD-10-CM | POA: Diagnosis not present

## 2019-07-13 DIAGNOSIS — F900 Attention-deficit hyperactivity disorder, predominantly inattentive type: Secondary | ICD-10-CM | POA: Diagnosis not present

## 2019-07-13 DIAGNOSIS — E559 Vitamin D deficiency, unspecified: Secondary | ICD-10-CM

## 2019-07-13 DIAGNOSIS — F339 Major depressive disorder, recurrent, unspecified: Secondary | ICD-10-CM

## 2019-07-13 DIAGNOSIS — Z0000 Encounter for general adult medical examination without abnormal findings: Secondary | ICD-10-CM

## 2019-07-13 MED ORDER — AMPHETAMINE-DEXTROAMPHETAMINE 20 MG PO TABS: 20.0000 mg | ORAL_TABLET | Freq: Every day | ORAL | 0 refills | Status: DC

## 2019-07-13 MED ORDER — ESCITALOPRAM OXALATE 10 MG PO TABS: 10.0000 mg | ORAL_TABLET | Freq: Every day | ORAL | 1 refills | Status: DC

## 2019-07-13 NOTE — Progress Notes (Signed)
Virtual Visit via Video Note  I connected with Madison Tapia on 07/13/19 at  1:30 PM EDT by a video enabled telemedicine application and verified that I am speaking with the correct person using two identifiers.  Location patient: home Location provider: work office Persons participating in the virtual visit: patient, provider  I discussed the limitations of evaluation and management by telemedicine and the availability of in person appointments. The patient expressed understanding and agreed to proceed.   HPI: This is a scheduled visit for medication refills.  She has a history of ADHD on 20 mg of Adderall daily, hypothyroidism, vitamin D and B12 deficiency as well as iron deficiency anemia due to menometrorrhagia.  Since I last saw her, she has had a hysterectomy and had a non-complicated postoperative course.  She ran out of Adderall right before the surgery and is definitely noticing the lack of it.  She is having difficulty finishing certain tasks.  She also feels like her depression has increased and manifested in the way of increased sadness, increased worry and increased aggression.  She is to have counseling twice a month but has had to decrease frequency due to COVID and she thinks that this may be part of it.  She was wondering if we could increase her Lexapro dose.  As an aside, her son just tested positive for cocaine and he is a direct household contacts.  She has not had any URI symptoms.   ROS: Constitutional: Denies fever, chills, diaphoresis, appetite change and fatigue.  HEENT: Denies photophobia, eye pain, redness, hearing loss, ear pain, congestion, sore throat, rhinorrhea, sneezing, mouth sores, trouble swallowing, neck pain, neck stiffness and tinnitus.   Respiratory: Denies SOB, DOE, cough, chest tightness,  and wheezing.   Cardiovascular: Denies chest pain, palpitations and leg swelling.  Gastrointestinal: Denies nausea, vomiting, abdominal pain, diarrhea,  constipation, blood in stool and abdominal distention.  Genitourinary: Denies dysuria, urgency, frequency, hematuria, flank pain and difficulty urinating.  Endocrine: Denies: hot or cold intolerance, sweats, changes in hair or nails, polyuria, polydipsia. Musculoskeletal: Denies myalgias, back pain, joint swelling, arthralgias and gait problem.  Skin: Denies pallor, rash and wound.  Neurological: Denies dizziness, seizures, syncope, weakness, light-headedness, numbness and headaches.  Hematological: Denies adenopathy. 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   Unspecified hypothyroidism 07/14/2007    Past Surgical History:  Procedure Laterality Date   KIDNEY STONE SURGERY     basket extraction-age 24   melanoma surgery  2018   from back   ROBOTIC ASSISTED LAPAROSCOPIC HYSTERECTOMY AND SALPINGECTOMY Bilateral 05/24/2019   Procedure: ATTEMPTED XI ROBOTIC ASSISTED TOTAL LAPAROSCOPIC HYSTERECTOMY AND SALPINGECTOMY, CONVERTED TO OPEN  PROEDURE: TOTAL  ABDOMINAL HYSTERECTOMY WITH BILATERAL SALPINGECTOMY;  Surgeon: Princess Bruins, MD;  Location: [facility];  Service: Gynecology;  Laterality: Bilateral;  request 7:30am OR time in [place] [facility] block requests two hours Will contact [place] re: RNFA t    Family History  Problem Relation Age of Onset   Cancer Mother  Thyroid disease Mother    Breast cancer Mother 40    Diabetes Father    Hypertension Father    Mental illness Father    Depression Neg Hx        family hx    SOCIAL HX:   reports that she has never smoked. She has never used smokeless tobacco. She reports current alcohol use. She reports that she does not use drugs.   Current Outpatient Medications:    amphetamine-dextroamphetamine (ADDERALL) 20 MG tablet, Take 1 tablet (20 mg total) by mouth daily., Disp: 30 tablet, Rfl: 0   amphetamine-dextroamphetamine (ADDERALL) 20 MG tablet, Take 1 tablet (20 mg total) by mouth daily., Disp: 30 tablet, Rfl: 0   amphetamine-dextroamphetamine (ADDERALL) 20 MG tablet, Take 1 tablet (20 mg total) by mouth daily., Disp: 30 tablet, Rfl: 0   cyanocobalamin (,VITAMIN B-12,) 1000 MCG/ML injection, Inject 1 mL (1,000 mcg total) into the muscle every 30 (thirty) days., Disp: 1 mL, Rfl: 11   diphenhydrAMINE (BENADRYL) 25 MG tablet, Take 25 mg by mouth daily as needed for allergies., Disp: , Rfl:    escitalopram (LEXAPRO) 10 MG tablet, Take 1 tablet (10 mg total) by mouth daily., Disp: 90 tablet, Rfl: 1   EUTHYROX 200 MCG tablet, TAKE 1 TABLET BY MOUTH ONCE DAILY BEFORE BREAKFAST, Disp: 90 tablet, Rfl: 0   ferrous sulfate 325 (65 FE) MG tablet, Take 325 mg by mouth 3 (three) times daily with meals., Disp: , Rfl:    ibuprofen (ADVIL) 200 MG tablet, Take 400-800 mg by mouth every 6 (six) hours as needed for headache or moderate pain., Disp: , Rfl:    magnesium hydroxide (DULCOLAX) 400 MG/5ML suspension, Take 5 mLs by mouth daily as needed for mild constipation., Disp: , Rfl:    oxyCODONE-acetaminophen (PERCOCET) 7.5-325 MG tablet, Take 1 tablet by mouth every 4 (four) hours as needed for severe pain., Disp: 30 tablet, Rfl: 0   SYRINGE/NEEDLE, DISP, 1 ML (B-D SYRINGE/NEEDLE 1CC/25GX5/8) 25G X 5/8" 1 ML MISC, Use for B12 injections, Disp: 100 each, Rfl: 3   Vitamin D, Ergocalciferol, (DRISDOL) 1.25 MG (50000 UT) CAPS capsule, Take 50,000 Units by mouth  every Sunday., Disp: , Rfl:    Vitamin D, Ergocalciferol, (DRISDOL) 1.25 MG (50000 UT) CAPS capsule, Take 1 capsule (50,000 Units total) by mouth every 7 (seven) days for 12 doses., Disp: 12 capsule, Rfl: 0  EXAM:   VITALS per patient if applicable: None reported  GENERAL: alert, oriented, appears well and in no acute distress  HEENT: atraumatic, conjunttiva clear, no obvious abnormalities on inspection of external nose and ears  NECK: normal movements of the head and neck  LUNGS: on inspection no signs of respiratory distress, breathing rate appears normal, no obvious gross increased work of breathing, gasping or wheezing  CV: no obvious cyanosis  MS: moves all visible extremities without noticeable abnormality  PSYCH/NEURO: pleasant and cooperative, no obvious depression or anxiety, speech and thought processing grossly intact  ASSESSMENT AND PLAN:   Vitamin D deficiency -She has not yet picked up her prescription, she will do so today. -Return in 12 weeks for repeat vitamin D level.  Attention deficit hyperactivity disorder (ADHD), predominantly inattentive type -Refill Adderall today x3 months.  Iron deficiency anemia due to chronic blood loss -Should be improving now that she has had her hysterectomy. -Check CBC when she returns for in person visit.  Depression, recurrent ([place]) -Increase Lexapro from 5 to 10 mg. -She will try and go back to  twice monthly counseling sessions.     I discussed the assessment and treatment plan with the patient. The patient was provided an opportunity to ask questions and all were answered. The patient agreed with the plan and demonstrated an understanding of the instructions.   The patient was advised to call back or seek an in-person evaluation if the symptoms worsen or if the condition fails to improve as anticipated.    Lelon Frohlich, MD  [facility] at [facility]

## 2019-07-26 DIAGNOSIS — Z20828 Contact with and (suspected) exposure to other viral communicable diseases: Secondary | ICD-10-CM | POA: Diagnosis not present

## 2019-07-28 ENCOUNTER — Other Ambulatory Visit: Payer: Self-pay

## 2019-07-29 ENCOUNTER — Ambulatory Visit (INDEPENDENT_AMBULATORY_CARE_PROVIDER_SITE_OTHER): Payer: BC Managed Care – PPO | Admitting: Obstetrics & Gynecology

## 2019-07-29 ENCOUNTER — Encounter: Payer: Self-pay | Admitting: Obstetrics & Gynecology

## 2019-07-29 VITALS — BP 126/78

## 2019-07-29 DIAGNOSIS — Z09 Encounter for follow-up examination after completed treatment for conditions other than malignant neoplasm: Secondary | ICD-10-CM

## 2019-07-29 NOTE — Patient Instructions (Signed)
1. Status post gynecological surgery, follow-up exam Excellent postop evolution with no complication.  Patient can resume activities fully including intercourse.  Follow-up for annual gynecologic exam.  Madison Tapia, it was a pleasure seeing you today!

## 2019-07-29 NOTE — Progress Notes (Signed)
    Madison Tapia Jan 26, 1971 [id number]        48 y.o.  G1P1L1 Married  RP: Postop TAH/Bilateral Salpingectomy on 05/24/2019  HPI: Feeling completely healed with no abdominal pelvic pain and no vaginal bleeding.  No fever.  Mild numbing of the right thigh persists, but not impeding movements and walking.     OB History  Gravida Para Term Preterm AB Living  1 1       1   SAB TAB Ectopic Multiple Live Births               # Outcome Date GA Lbr Len/2nd Weight Sex Delivery Anes PTL Lv  1 Para             Past medical history,surgical history, problem list, medications, allergies, family history and social history were all reviewed and documented in the EPIC chart.   Directed ROS with pertinent positives and negatives documented in the history of present illness/assessment and plan.  Exam:  Vitals:   07/29/19 1425  BP: 126/78   General appearance:  Normal  Abdomen: Incision completely healed.  No induration and not tender.  Gynecologic exam: Vulva normal.  Speculum: Vaginal vault well closed.  Bimanual exam: Vaginal vault nontender and well closed.  No pelvic mass.  Nontender.   Assessment/Plan:  48 y.o. G1P1   1. Status post gynecological surgery, follow-up exam Excellent postop evolution with no complication.  Patient can resume activities fully including intercourse.  Follow-up for annual gynecologic exam.   Princess Bruins MD, 2:30 PM 07/29/2019

## 2019-08-12 ENCOUNTER — Telehealth: Payer: Self-pay | Admitting: Internal Medicine

## 2019-08-12 NOTE — Telephone Encounter (Signed)
Forwarding to PCP for approval  

## 2019-08-12 NOTE — Telephone Encounter (Signed)
Medication Refill - Medication: amphetamine-dextroamphetamine (ADDERALL) 20 MG tablet   Has the patient contacted their pharmacy? Yes.   (Agent: If no, request that the patient contact the pharmacy for the refill.) (Agent: If yes, when and what did the pharmacy advise?)  Preferred Pharmacy (with phone number or street name): [id number] - [place], [place]  Agent: Please be advised that RX refills may take up to 3 business days. We ask that you follow-up with your pharmacy.

## 2019-08-12 NOTE — Telephone Encounter (Signed)
Requested medication (s) are due for refill today: yes  Requested medication (s) are on the active medication list: yes  Last refill: 07/13/2019  Future visit scheduled: no  Notes to clinic: refill cannot be delegated    Requested Prescriptions  Pending Prescriptions Disp Refills   amphetamine-dextroamphetamine (ADDERALL) 20 MG tablet 30 tablet 0    Sig: Take 1 tablet (20 mg total) by mouth daily.     Not Delegated - Psychiatry:  Stimulants/ADHD Failed - 08/12/2019 12:52 PM      Failed - This refill cannot be delegated      Failed - Urine Drug Screen completed in last 360 days.      Passed - Valid encounter within last 3 months    Recent Outpatient Visits          1 month ago Vitamin D deficiency   [facility], Rayford Halsted, MD   5 months ago Pelvic pain   [place] [facility], Rayford Halsted, MD   11 months ago Acquired hypothyroidism   [facility], Rayford Halsted, MD   1 year ago Acquired hypothyroidism   [place], Doretha Sou, MD   1 year ago [place] [facility], Doretha Sou, MD

## 2019-08-17 NOTE — Telephone Encounter (Signed)
Patient picked up her prescription today. Last one can be filled after 09/10/2019.

## 2019-10-03 ENCOUNTER — Other Ambulatory Visit: Payer: Self-pay | Admitting: Internal Medicine

## 2019-10-03 DIAGNOSIS — Z0000 Encounter for general adult medical examination without abnormal findings: Secondary | ICD-10-CM

## 2019-10-04 NOTE — Telephone Encounter (Signed)
Patient need to schedule an ov to check TSH levels for more refills.

## 2019-10-27 ENCOUNTER — Ambulatory Visit: Payer: 59 | Admitting: Internal Medicine

## 2019-10-27 ENCOUNTER — Encounter: Payer: Self-pay | Admitting: Internal Medicine

## 2019-10-27 ENCOUNTER — Other Ambulatory Visit: Payer: Self-pay | Admitting: Internal Medicine

## 2019-10-27 ENCOUNTER — Other Ambulatory Visit: Payer: Self-pay

## 2019-10-27 VITALS — BP 130/80 | HR 81 | Temp 97.9°F | Wt 321.6 lb

## 2019-10-27 DIAGNOSIS — E039 Hypothyroidism, unspecified: Secondary | ICD-10-CM

## 2019-10-27 DIAGNOSIS — F900 Attention-deficit hyperactivity disorder, predominantly inattentive type: Secondary | ICD-10-CM

## 2019-10-27 DIAGNOSIS — E559 Vitamin D deficiency, unspecified: Secondary | ICD-10-CM | POA: Diagnosis not present

## 2019-10-27 DIAGNOSIS — R300 Dysuria: Secondary | ICD-10-CM

## 2019-10-27 DIAGNOSIS — G4730 Sleep apnea, unspecified: Secondary | ICD-10-CM | POA: Diagnosis not present

## 2019-10-27 DIAGNOSIS — D500 Iron deficiency anemia secondary to blood loss (chronic): Secondary | ICD-10-CM | POA: Diagnosis not present

## 2019-10-27 DIAGNOSIS — E538 Deficiency of other specified B group vitamins: Secondary | ICD-10-CM | POA: Diagnosis not present

## 2019-10-27 DIAGNOSIS — Z0000 Encounter for general adult medical examination without abnormal findings: Secondary | ICD-10-CM

## 2019-10-27 LAB — POCT URINALYSIS DIPSTICK
Bilirubin, UA: NEGATIVE
Blood, UA: NEGATIVE
Glucose, UA: NEGATIVE
Ketones, UA: NEGATIVE
Nitrite, UA: POSITIVE
Protein, UA: NEGATIVE
Spec Grav, UA: 1.025 (ref 1.010–1.025)
Urobilinogen, UA: 0.2 E.U./dL
pH, UA: 6 (ref 5.0–8.0)

## 2019-10-27 LAB — CBC WITH DIFFERENTIAL/PLATELET
Basophils Absolute: 0.1 10*3/uL (ref 0.0–0.1)
Basophils Relative: 0.7 % (ref 0.0–3.0)
Eosinophils Absolute: 0.1 10*3/uL (ref 0.0–0.7)
Eosinophils Relative: 1.6 % (ref 0.0–5.0)
HCT: 36.3 % (ref 36.0–46.0)
Hemoglobin: 11.6 g/dL — ABNORMAL LOW (ref 12.0–15.0)
Lymphocytes Relative: 26.8 % (ref 12.0–46.0)
Lymphs Abs: 2.5 10*3/uL (ref 0.7–4.0)
MCHC: 31.8 g/dL (ref 30.0–36.0)
MCV: 68.6 fl — ABNORMAL LOW (ref 78.0–100.0)
Monocytes Absolute: 0.5 10*3/uL (ref 0.1–1.0)
Monocytes Relative: 5.5 % (ref 3.0–12.0)
Neutro Abs: 6 10*3/uL (ref 1.4–7.7)
Neutrophils Relative %: 65.4 % (ref 43.0–77.0)
Platelets: 343 10*3/uL (ref 150.0–400.0)
RBC: 5.3 Mil/uL — ABNORMAL HIGH (ref 3.87–5.11)
RDW: 19.3 % — ABNORMAL HIGH (ref 11.5–15.5)
WBC: 9.2 10*3/uL (ref 4.0–10.5)

## 2019-10-27 LAB — VITAMIN D 25 HYDROXY (VIT D DEFICIENCY, FRACTURES): VITD: 18.59 ng/mL — ABNORMAL LOW (ref 30.00–100.00)

## 2019-10-27 LAB — TSH: TSH: 81.06 u[IU]/mL — ABNORMAL HIGH (ref 0.35–4.50)

## 2019-10-27 MED ORDER — LEVOTHYROXINE SODIUM 200 MCG PO TABS: ORAL_TABLET | ORAL | 1 refills | Status: DC

## 2019-10-27 MED ORDER — VITAMIN D (ERGOCALCIFEROL) 1.25 MG (50000 UNIT) PO CAPS: 50000.0000 [IU] | ORAL_CAPSULE | ORAL | 0 refills | Status: AC

## 2019-10-27 MED ORDER — AMPHETAMINE-DEXTROAMPHETAMINE 20 MG PO TABS: 20.0000 mg | ORAL_TABLET | Freq: Every day | ORAL | 0 refills | Status: DC

## 2019-10-27 MED ORDER — CIPROFLOXACIN HCL 500 MG PO TABS: 500.0000 mg | ORAL_TABLET | Freq: Two times a day (BID) | ORAL | 0 refills | Status: AC

## 2019-10-27 MED ORDER — CYANOCOBALAMIN 1000 MCG/ML IJ SOLN
1000.0000 ug | Freq: Once | INTRAMUSCULAR | Status: AC
  Administered 2019-10-27: 13:00:00 1000 ug via INTRAMUSCULAR

## 2019-10-27 NOTE — Progress Notes (Signed)
Established Patient Office Visit     This visit occurred during the SARS-CoV-2 public health emergency.  Safety protocols were in place, including screening questions prior to the visit, additional usage of staff PPE, and extensive cleaning of exam room while observing appropriate contact time as indicated for disinfecting solutions.    CC/Reason for Visit: Several acute complaints  HPI: Madison Tapia is a 49 y.o. female who is coming in today for the above mentioned reasons.  She is here today to discuss several acute complaints  1.  She has been having dysuria and a sense of urinary frequency.  Few days.  Denies any fever or suprapubic pain.  2.  She feels like she needs her TSH and vitamin D levels rechecked.  3.  She is due for her Adderall refills for her ADHD.  She has had no recent change in doses and is currently well in that regards.  4.  She would like to receive her monthly B12 injection today.  5.  She would like a referral to pulmonary as she believes she has sleep apnea.  She has been snoring a lot, wakes up frequently at night and has excessive daytime fatigue.  She dozes off at work afternoon.  6.  She continues to gain weight, she has gained 10 pounds since her last visit.  7.  She would like to have her hemoglobin levels checked.  She had a hysterectomy last year for menometrorrhagia, she did have iron deficiency anemia and was set up at short stay to receive IV iron infusions she has not been taking oral ferrous sulfate.  Past Medical/Surgical History: Past Medical History:  Diagnosis Date  . ADD 07/14/2007  . ANXIETY 06/24/2007  . COMMON MIGRAINE 06/24/2007  . DEPRESSION 06/24/2007  . Family history of adverse reaction to anesthesia    family -extreme nausea, hallucinations (mother)  . Headache(784.0) 10/08/2007  . HX, URINARY INFECTION 06/24/2007  . IRRITABLE BOWEL SYNDROME, HX OF 06/24/2007  . MALAISE AND FATIGUE 06/24/2007  . NEPHROLITHIASIS, HX OF  06/24/2007  . Unspecified hypothyroidism 07/14/2007    Past Surgical History:  Procedure Laterality Date  . KIDNEY STONE SURGERY     basket extraction-age 26  . melanoma surgery  2018   from back  . ROBOTIC ASSISTED LAPAROSCOPIC HYSTERECTOMY AND SALPINGECTOMY Bilateral 05/24/2019   Procedure: ATTEMPTED XI ROBOTIC ASSISTED TOTAL LAPAROSCOPIC HYSTERECTOMY AND SALPINGECTOMY, CONVERTED TO OPEN  PROEDURE: TOTAL  ABDOMINAL HYSTERECTOMY WITH BILATERAL SALPINGECTOMY;  Surgeon: Princess Bruins, MD;  Location: [facility];  Service: Gynecology;  Laterality: Bilateral;  request 7:30am OR time in [place] block requests two hours Will contact [place] re: RNFA t    Social History:  reports that she has never smoked. She has never used smokeless tobacco. She reports current alcohol use. She reports that she does not use drugs.  Allergies: Allergies  Allergen Reactions  . Sulfa Antibiotics Hives  . Sulfonamide Derivatives Hives    Family History:  Family History  Problem Relation Age of Onset  . Cancer Mother   . Thyroid disease Mother   . Breast cancer Mother 32  . Diabetes Father   . Hypertension Father   . Mental illness Father   . Depression Neg Hx        family hx     Current Outpatient Medications:  .  amphetamine-dextroamphetamine (ADDERALL) 20 MG tablet, Take 1 tablet (20 mg total) by mouth daily., Disp: 30 tablet, Rfl: 0 .  cyanocobalamin (,  VITAMIN B-12,) 1000 MCG/ML injection, Inject 1 mL (1,000 mcg total) into the muscle every 30 (thirty) days., Disp: 1 mL, Rfl: 11 .  diphenhydrAMINE (BENADRYL) 25 MG tablet, Take 25 mg by mouth daily as needed for allergies., Disp: , Rfl:  .  escitalopram (LEXAPRO) 10 MG tablet, Take 1 tablet (10 mg total) by mouth daily., Disp: 90 tablet, Rfl: 1 .  EUTHYROX 200 MCG tablet, TAKE 1 TABLET BY MOUTH ONCE DAILY BEFORE BREAKFAST, Disp: 90 tablet, Rfl: 0 .  ibuprofen (ADVIL) 200 MG tablet, Take 400-800 mg by mouth every 6  (six) hours as needed for headache or moderate pain., Disp: , Rfl:  .  SYRINGE/NEEDLE, DISP, 1 ML (B-D SYRINGE/NEEDLE 1CC/25GX5/8) 25G X 5/8" 1 ML MISC, Use for B12 injections, Disp: 100 each, Rfl: 3 .  amphetamine-dextroamphetamine (ADDERALL) 20 MG tablet, Take 1 tablet (20 mg total) by mouth daily before breakfast., Disp: 30 tablet, Rfl: 0 .  [START ON 11/26/2019] amphetamine-dextroamphetamine (ADDERALL) 20 MG tablet, Take 1 tablet (20 mg total) by mouth daily before breakfast. FILL IN ONE MONTH, Disp: 30 tablet, Rfl: 0 .  [START ON 12/26/2019] amphetamine-dextroamphetamine (ADDERALL) 20 MG tablet, Take 1 tablet (20 mg total) by mouth daily before breakfast. FILL IN TWO MONTHS, Disp: 30 tablet, Rfl: 0 .  ciprofloxacin (CIPRO) 500 MG tablet, Take 1 tablet (500 mg total) by mouth 2 (two) times daily for 5 days., Disp: 10 tablet, Rfl: 0  Review of Systems:  Constitutional: Denies fever, chills, diaphoresis, appetite change and fatigue.  HEENT: Denies photophobia, eye pain, redness, hearing loss, ear pain, congestion, sore throat, rhinorrhea, sneezing, mouth sores, trouble swallowing, neck pain, neck stiffness and tinnitus.   Respiratory: Denies SOB, DOE, cough, chest tightness,  and wheezing.   Cardiovascular: Denies chest pain, palpitations and leg swelling.  Gastrointestinal: Denies nausea, vomiting, abdominal pain, diarrhea, constipation, blood in stool and abdominal distention.  Genitourinary: Denies hematuria, flank pain and difficulty urinating.  Endocrine: Denies: hot or cold intolerance, sweats, changes in hair or nails, polyuria, polydipsia. Musculoskeletal: Denies myalgias, back pain, joint swelling, arthralgias and gait problem.  Skin: Denies pallor, rash and wound.  Neurological: Denies dizziness, seizures, syncope, weakness, light-headedness, numbness and headaches.  Hematological: Denies adenopathy. Easy bruising, personal or family bleeding history  Psychiatric/Behavioral: Denies  suicidal ideation, mood changes, confusion, nervousness, sleep disturbance and agitation    Physical Exam: Vitals:   10/27/19 1035  BP: 130/80  Pulse: 81  Temp: 97.9 F (36.6 C)  TempSrc: Temporal  SpO2: 98%  Weight: (!) 321 lb 9.6 oz (145.9 kg)    Body mass index is 50.37 kg/m.   Constitutional: NAD, calm, comfortable, obese Eyes: PERRL, lids and conjunctivae normal ENMT: Mucous membranes are moist.  Respiratory: clear to auscultation bilaterally, no wheezing, no crackles. Normal respiratory effort. No accessory muscle use.  Cardiovascular: Regular rate and rhythm, no murmurs / rubs / gallops. No extremity edema. .  Neurologic: Grossly intact nonfocal Psychiatric: Normal judgment and insight. Alert and oriented x 3. Normal mood.    Impression and Plan:  Dysuria  -She does have leukocytes on her urine dipstick. -Sent for culture, treat with Cipro 500 mg for 5 days, she has a sulfa allergy.  Vitamin D deficiency  -Check levels today.  Sleep apnea, unspecified type  - Plan: Ambulatory referral to Pulmonology  Vitamin B12 deficiency -IM injection today  Acquired hypothyroidism  - Plan: TSH -She has not been taking her levothyroxine for 3 weeks as she ran out.  Iron deficiency  anemia due to chronic blood loss  - Plan: CBC with Differential/Platelet  Attention deficit hyperactivity disorder (ADHD), predominantly inattentive type  -Refill Adderall x3 months.  Morbid obesity ([place]) -Discussed healthy lifestyle, including increased physical activity and better food choices to promote weight loss.    Patient Instructions  -Nice seeing you today!!  -Lab work today; will notify you once results are available.  -Start cipro 500 mg 1 tablet twice daily for 5 days.  -See you back in 3 months for your physical. Please come in fasting that day.     Lelon Frohlich, MD [facility]

## 2019-10-27 NOTE — Addendum Note (Signed)
Addended by: Westley Hummer B on: 10/27/2019 01:16 PM   Modules accepted: Orders

## 2019-10-27 NOTE — Patient Instructions (Signed)
-  Nice seeing you today!!  -Lab work today; will notify you once results are available.  -Start cipro 500 mg 1 tablet twice daily for 5 days.  -See you back in 3 months for your physical. Please come in fasting that day.

## 2019-10-29 LAB — URINE CULTURE
MICRO NUMBER:: 10091780
SPECIMEN QUALITY:: ADEQUATE

## 2019-11-01 ENCOUNTER — Other Ambulatory Visit: Payer: Self-pay | Admitting: Internal Medicine

## 2019-11-01 DIAGNOSIS — E559 Vitamin D deficiency, unspecified: Secondary | ICD-10-CM

## 2019-11-09 ENCOUNTER — Telehealth: Payer: Self-pay | Admitting: *Deleted

## 2019-11-09 NOTE — Telephone Encounter (Signed)
Prior auth started for: amphetamine-dextroamphetamine (ADDERALL) 20 MG tablet  Key: [id number]

## 2019-12-16 ENCOUNTER — Ambulatory Visit: Payer: 59

## 2019-12-19 ENCOUNTER — Telehealth: Payer: 59 | Admitting: Nurse Practitioner

## 2019-12-19 DIAGNOSIS — J069 Acute upper respiratory infection, unspecified: Secondary | ICD-10-CM | POA: Diagnosis not present

## 2019-12-19 MED ORDER — FLUTICASONE PROPIONATE 50 MCG/ACT NA SUSP: 2.0000 | Freq: Every day | NASAL | 6 refills | Status: AC

## 2019-12-19 MED ORDER — AMOXICILLIN-POT CLAVULANATE 875-125 MG PO TABS: 1.0000 | ORAL_TABLET | Freq: Two times a day (BID) | ORAL | 0 refills | Status: DC

## 2019-12-19 MED ORDER — BENZONATATE 100 MG PO CAPS: 100.0000 mg | ORAL_CAPSULE | Freq: Three times a day (TID) | ORAL | 0 refills | Status: DC | PRN

## 2019-12-19 NOTE — Progress Notes (Signed)
We are sorry you are not feeling well.  Here is how we plan to help!  Based on what you have shared with me, it looks like you may have a viral upper respiratory infection.  Upper respiratory infections are caused by a large number of viruses; however, rhinovirus is the most common cause.   Symptoms vary from person to person, with common symptoms including sore throat, cough, fatigue or lack of energy and feeling of general discomfort.  A low-grade fever of up to 100.4 may present, but is often uncommon.  Symptoms vary however, and are closely related to a person's age or underlying illnesses.  The most common symptoms associated with an upper respiratory infection are nasal discharge or congestion, cough, sneezing, headache and pressure in the ears and face.  These symptoms usually persist for about 3 to 10 days, but can last up to 2 weeks.  It is important to know that upper respiratory infections do not cause serious illness or complications in most cases.    Upper respiratory infections can be transmitted from person to person, with the most common method of transmission being a person's hands.  The virus is able to live on the skin and can infect other persons for up to 2 hours after direct contact.  Also, these can be transmitted when someone coughs or sneezes; thus, it is important to cover the mouth to reduce this risk.  To keep the spread of the illness at [place], good hand hygiene is very important.  This is an infection that is most likely caused by a virus. There are no specific treatments other than to help you with the symptoms until the infection runs its course.  We are sorry you are not feeling well.  Here is how we plan to help!   For nasal congestion, you may use an oral decongestants such as Mucinex D or if you have glaucoma or high blood pressure use plain Mucinex.  Saline nasal spray or nasal drops can help and can safely be used as often as needed for congestion.  For your congestion,  I have prescribed Fluticasone nasal spray one spray in each nostril twice a day  If you do not have a history of heart disease, hypertension, diabetes or thyroid disease, prostate/bladder issues or glaucoma, you may also use Sudafed to treat nasal congestion.  It is highly recommended that you consult with a pharmacist or your primary care physician to ensure this medication is safe for you to take.     If you have a cough, you may use cough suppressants such as Delsym and Robitussin.  If you have glaucoma or high blood pressure, you can also use Coricidin HBP.   For cough I have prescribed for you A prescription cough medication called Tessalon Perles 100 mg. You may take 1-2 capsules every 8 hours as needed for cough   I have also rx and antibiotic: Augmentin 875mg  1 po BID for 7 days.  If you have a sore or scratchy throat, use a saltwater gargle-  to  teaspoon of salt dissolved in a 4-ounce to 8-ounce glass of warm water.  Gargle the solution for approximately 15-30 seconds and then spit.  It is important not to swallow the solution.  You can also use throat lozenges/cough drops and Chloraseptic spray to help with throat pain or discomfort.  Warm or cold liquids can also be helpful in relieving throat pain.  For headache, pain or general discomfort, you can use Ibuprofen  or Tylenol as directed.   Some authorities believe that zinc sprays or the use of Echinacea may shorten the course of your symptoms.   HOME CARE . Only take medications as instructed by your medical team. . Be sure to drink plenty of fluids. Water is fine as well as fruit juices, sodas and electrolyte beverages. You may want to stay away from caffeine or alcohol. If you are nauseated, try taking small sips of liquids. How do you know if you are getting enough fluid? Your urine should be a pale yellow or almost colorless. . Get rest. . Taking a steamy shower or using a humidifier may help nasal congestion and ease sore throat  pain. You can place a towel over your head and breathe in the steam from hot water coming from a faucet. . Using a saline nasal spray works much the same way. . Cough drops, hard candies and sore throat lozenges may ease your cough. . Avoid close contacts especially the very young and the elderly . Cover your mouth if you cough or sneeze . Always remember to wash your hands.   GET HELP RIGHT AWAY IF: . You develop worsening fever. . If your symptoms do not improve within 10 days . You develop yellow or green discharge from your nose over 3 days. . You have coughing fits . You develop a severe head ache or visual changes. . You develop shortness of breath, difficulty breathing or start having chest pain . Your symptoms persist after you have completed your treatment plan  MAKE SURE YOU   Understand these instructions.  Will watch your condition.  Will get help right away if you are not doing well or get worse.  Your e-visit answers were reviewed by a board certified advanced clinical practitioner to complete your personal care plan. Depending upon the condition, your plan could have included both over the counter or prescription medications. Please review your pharmacy choice. If there is a problem, you may call our nursing hot line at and have the prescription routed to another pharmacy. Your safety is important to [place]. If you have drug allergies check your prescription carefully.   You can use MyChart to ask questions about today's visit, request a non-urgent call back, or ask for a work or school excuse for 24 hours related to this e-Visit. If it has been greater than 24 hours you will need to follow up with your provider, or enter a new e-Visit to address those concerns. You will get an e-mail in the next two days asking about your experience.  I hope that your e-visit has been valuable and will speed your recovery. Thank you for using e-visits.   5-10 minutes spent reviewing and  documenting in chart.

## 2019-12-23 ENCOUNTER — Ambulatory Visit: Payer: 59

## 2019-12-30 ENCOUNTER — Ambulatory Visit: Payer: 59 | Attending: Internal Medicine

## 2019-12-30 DIAGNOSIS — Z23 Encounter for immunization: Secondary | ICD-10-CM

## 2019-12-30 NOTE — Progress Notes (Signed)
   Covid-19 Vaccination Clinic  Name:  Madison Tapia    MRN: [id number] DOB: Jan 16, 1971  12/30/2019  Ms. Holloway was observed post Covid-19 immunization for 15 minutes without incident. She was provided with Vaccine Information Sheet and instruction to access the V-Safe system.   Ms. Champoux was instructed to call 911 with any severe reactions post vaccine: Marland Kitchen Difficulty breathing  . Swelling of face and throat  . A fast heartbeat  . A bad rash all over body  . Dizziness and weakness   Immunizations Administered    Name Date Dose VIS Date Route   Pfizer COVID-19 Vaccine 12/30/2019  8:19 AM 0.3 mL 09/09/2019 Intramuscular   Manufacturer: [employer], [employer]   Lot: [id number]   [place]: [id number]

## 2020-01-23 ENCOUNTER — Ambulatory Visit: Payer: 59

## 2020-01-23 ENCOUNTER — Ambulatory Visit: Payer: 59 | Attending: Internal Medicine

## 2020-01-23 DIAGNOSIS — Z23 Encounter for immunization: Secondary | ICD-10-CM

## 2020-01-23 NOTE — Progress Notes (Signed)
   Covid-19 Vaccination Clinic  Name:  Madison Tapia    MRN: [id number] DOB: 1971-03-17  01/23/2020  [name] was observed post Covid-19 immunization for 15 minutes without incident. She was provided with Vaccine Information Sheet and instruction to access the V-Safe system.   [name] was instructed to call 911 with any severe reactions post vaccine: Marland Kitchen Difficulty breathing  . Swelling of face and throat  . A fast heartbeat  . A bad rash all over body  . Dizziness and weakness   Immunizations Administered    Name Date Dose VIS Date Route   Pfizer COVID-19 Vaccine 01/23/2020  2:34 PM 0.3 mL 11/23/2018 Intramuscular   Manufacturer: [place]   Lot: [id number]   [place]: [id number]

## 2020-01-27 ENCOUNTER — Other Ambulatory Visit: Payer: Self-pay

## 2020-01-30 ENCOUNTER — Other Ambulatory Visit: Payer: Self-pay

## 2020-02-01 ENCOUNTER — Ambulatory Visit: Payer: 59 | Admitting: Internal Medicine

## 2020-02-01 ENCOUNTER — Other Ambulatory Visit: Payer: Self-pay

## 2020-02-01 ENCOUNTER — Encounter: Payer: Self-pay | Admitting: Internal Medicine

## 2020-02-01 VITALS — BP 122/80 | HR 82 | Temp 98.0°F | Ht 67.0 in | Wt 312.0 lb

## 2020-02-01 DIAGNOSIS — R0683 Snoring: Secondary | ICD-10-CM | POA: Diagnosis not present

## 2020-02-01 DIAGNOSIS — G4733 Obstructive sleep apnea (adult) (pediatric): Secondary | ICD-10-CM | POA: Diagnosis not present

## 2020-02-01 DIAGNOSIS — F900 Attention-deficit hyperactivity disorder, predominantly inattentive type: Secondary | ICD-10-CM | POA: Diagnosis not present

## 2020-02-01 NOTE — Assessment & Plan Note (Signed)
Long term use of adderall. Tiredness from OSA may contribute and could potentially respond to Rx for OSA.

## 2020-02-01 NOTE — Assessment & Plan Note (Signed)
We discussed snoring and sleep apnea, sleep habits, testing, treatments and driving safety. Plan- sleep study. Possible CPAP or oral appliance,

## 2020-02-01 NOTE — Patient Instructions (Signed)
Order- schedule HST   Dx OSA  Please call us about 2 weeks after your sleep test, to see if results and recommendations are ready yet. If appropriate, we may be able to start treatment before we see you next.

## 2020-02-01 NOTE — Assessment & Plan Note (Signed)
Discussed importance of normalizing body weight. Consider Bariatric counseling.

## 2020-02-01 NOTE — Progress Notes (Signed)
02/01/20- 49 yoF never smoker for sleep evaluation on kind referral from Dr  Isaac Bliss Medical problem list includes Migraine, Hypothyroid, ADD, Depression, Morbid Obesity, B12 and Vit D deficiency,  Meds include adderall 20 mg, lexapro, levothyroxine,  Body weight today 312 lbs Epworth score - 12 2[employer]izer Covax Partner tells her of loud snoring. She is aware of restless sleep and daytime tiredness. She hs used occasional melatonin or Zzquil. Bedtime around 11PM, latency 10 minutes.Wakes x 4 for bathroom or sips of water. During the day has used adderall for years, no caffeine. Father had CPAP- now deceased. ENT- no surgery. Often has foreign body/ globus sensation in throat, but swallows ok. Denies GERD. Denies heart, lung or neurologic problem.  Prior to Admission medications   Medication Sig Start Date End Date Taking? Authorizing Provider  amphetamine-dextroamphetamine (ADDERALL) 20 MG tablet Take 1 tablet (20 mg total) by mouth daily. 07/13/19  Yes Isaac Bliss, Rayford Halsted, MD  cholecalciferol (VITAMIN D3) 25 MCG (1000 UNIT) tablet Take 1,000 Units by mouth daily.   Yes [provider]  escitalopram (LEXAPRO) 10 MG tablet Take 1 tablet (10 mg total) by mouth daily. 07/13/19  Yes Isaac Bliss, Rayford Halsted, MD  ferrous sulfate 324 MG TBEC Take 324 mg by mouth.   Yes [provider]  fluticasone (FLONASE) 50 MCG/ACT nasal spray Place 2 sprays into both nostrils daily. 12/19/19  Yes Martin, Mary-Margaret, FNP  ibuprofen (ADVIL) 200 MG tablet Take 400-800 mg by mouth every 6 (six) hours as needed for headache or moderate pain.   Yes [provider]  levothyroxine (EUTHYROX) 200 MCG tablet TAKE 1 TABLET BY MOUTH ONCE DAILY BEFORE BREAKFAST 10/27/19  Yes Isaac Bliss, Rayford Halsted, MD  SYRINGE/NEEDLE, DISP, 1 ML (B-D SYRINGE/NEEDLE 1CC/25GX5/8) 25G X 5/8" 1 ML MISC Use for B12 injections 02/24/19  Yes Isaac Bliss, Rayford Halsted, MD  diphenhydrAMINE (BENADRYL) 25 MG  tablet Take 25 mg by mouth daily as needed for allergies.    [provider]   Past Medical History:  Diagnosis Date  . ADD 07/14/2007  . ANXIETY 06/24/2007  . COMMON MIGRAINE 06/24/2007  . DEPRESSION 06/24/2007  . Family history of adverse reaction to anesthesia    family -extreme nausea, hallucinations (mother)  . Headache(784.0) 10/08/2007  . HX, URINARY INFECTION 06/24/2007  . IRRITABLE BOWEL SYNDROME, HX OF 06/24/2007  . MALAISE AND FATIGUE 06/24/2007  . 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 Unspecified hypothyroidism 07/14/2007   Past Surgical History:  Procedure Laterality Date  . KIDNEY STONE SURGERY     basket extraction-age 32  . melanoma surgery  2018   from back  . ROBOTIC ASSISTED LAPAROSCOPIC HYSTERECTOMY AND SALPINGECTOMY Bilateral 05/24/2019   Procedure: ATTEMPTED XI ROBOTIC ASSISTED TOTAL LAPAROSCOPIC HYSTERECTOMY AND SALPINGECTOMY, CONVERTED TO OPEN  PROEDURE: TOTAL  ABDOMINAL HYSTERECTOMY WITH BILATERAL SALPINGECTOMY;  Surgeon: Princess Bruins, MD;  Location: [facility];  Service: Gynecology;  Laterality: Bilateral;  request 7:30am OR time in [place] Gyn block requests two hours Will contact [place] re: RNFA t   Family History  Problem Relation Age of Onset  . Cancer Mother   . Thyroid disease Mother   . Breast cancer Mother 89  . Diabetes Father   . Hypertension Father   . Mental illness Father   . Depression Neg Hx        family hx   Social History   Socioeconomic History  . Marital status: Significant Other    Spouse name: Not on file  .  Number of children: Not on file  . Years of education: Not on file  . Highest education level: Not on file  Occupational History  . Not on file  Tobacco Use  . Smoking status: Never Smoker  . Smokeless tobacco: Never Used  Substance and Sexual Activity  . Alcohol use: Yes    Comment: occ  . Drug use: No  . Sexual activity: Not Currently    Partners: Male    Comment: 1st intercourse-  17, partners- greater than 5, boyfriend- 43 yrs  Other Topics Concern  . Not on file  Social History Narrative  . Not on file   Social Determinants of Health   Financial Resource Strain:   . Difficulty of Paying Living Expenses:   Food Insecurity:   . Worried About Charity fundraiser in the Last Year:   . Arboriculturist in the Last Year:   Transportation Needs:   . Film/video editor (Medical):   Marland Kitchen Lack of Transportation (Non-Medical):   Physical Activity:   . Days of Exercise per Week:   . Minutes of Exercise per Session:   Stress:   . Feeling of Stress :   Social Connections:   . Frequency of Communication with Friends and Family:   . Frequency of Social Gatherings with Friends and Family:   . Attends Religious Services:   . Active Member of Clubs or Organizations:   . Attends Archivist Meetings:   Marland Kitchen Marital Status:   Intimate Partner Violence:   . Fear of Current or Ex-Partner:   . Emotionally Abused:   Marland Kitchen Physically Abused:   . Sexually Abused:    ROS-see HPI  + = positive Constitutional:    weight loss, night sweats, fevers, chills, fatigue, lassitude. HEENT:   + headaches, difficulty swallowing, tooth/dental problems, sore throat,       +sneezing, +itching, ear ache, +nasal congestion, post nasal drip, snoring CV:    chest pain, orthopnea, PND, swelling in lower extremities, anasarca,                                  dizziness, palpitations Resp:   +shortness of breath with exertion or at rest.                +productive cough,   +non-productive cough, coughing up of blood.              change in color of mucus.  wheezing.   Skin:    rash or lesions. GI:  No-   heartburn, indigestion, abdominal pain, nausea, vomiting, diarrhea,                 change in bowel habits, loss of appetite GU: dysuria, change in color of urine, no urgency or frequency.   flank pain. MS:   joint pain, stiffness, decreased range of motion, back pain. Neuro-     nothing  unusual Psych:  change in mood or affect. + depression or anxiety.   memory loss.  OBJ- Physical Exam General- Alert, Oriented, Affect-appropriate, Distress- none acute, + obese Skin- rash-none, lesions- none, excoriation- none Lymphadenopathy- none Head- atraumatic            Eyes- Gross vision intact, PERRLA, conjunctivae and secretions clear            Ears- Hearing, canals-normal            Nose-  Clear, no-Septal dev, mucus, polyps, erosion, perforation             Throat- Mallampati II-III , mucosa clear , drainage- none, tonsils- atrophic, + teeth Neck- flexible , trachea midline, no stridor , thyroid nl, carotid no bruit Chest - symmetrical excursion , unlabored           Heart/CV- RRR , no murmur , no gallop  , no rub, nl s1 s2                           - JVD- none , edema- none, stasis changes- none, varices- none           Lung- clear to P&A, wheeze- none, cough- none , dullness-none, rub- none           Chest wall-  Abd-  Br/ Gen/ Rectal- Not done, not indicated Extrem- cyanosis- none, clubbing, none, atrophy- none, strength- nl Neuro- grossly intact to observation

## 2020-02-02 ENCOUNTER — Other Ambulatory Visit: Payer: Self-pay

## 2020-02-03 ENCOUNTER — Other Ambulatory Visit: Payer: Self-pay | Admitting: Internal Medicine

## 2020-02-03 ENCOUNTER — Other Ambulatory Visit (INDEPENDENT_AMBULATORY_CARE_PROVIDER_SITE_OTHER): Payer: 59

## 2020-02-03 DIAGNOSIS — E559 Vitamin D deficiency, unspecified: Secondary | ICD-10-CM

## 2020-02-03 LAB — VITAMIN D 25 HYDROXY (VIT D DEFICIENCY, FRACTURES): VITD: 17.59 ng/mL — ABNORMAL LOW (ref 30.00–100.00)

## 2020-02-03 MED ORDER — VITAMIN D (ERGOCALCIFEROL) 1.25 MG (50000 UNIT) PO CAPS: 50000.0000 [IU] | ORAL_CAPSULE | ORAL | 0 refills | Status: AC

## 2020-02-06 ENCOUNTER — Ambulatory Visit: Payer: 59

## 2020-02-06 ENCOUNTER — Other Ambulatory Visit: Payer: Self-pay

## 2020-02-06 DIAGNOSIS — G4733 Obstructive sleep apnea (adult) (pediatric): Secondary | ICD-10-CM

## 2020-02-07 DIAGNOSIS — G4733 Obstructive sleep apnea (adult) (pediatric): Secondary | ICD-10-CM | POA: Diagnosis not present

## 2020-03-02 ENCOUNTER — Other Ambulatory Visit: Payer: Self-pay

## 2020-03-02 ENCOUNTER — Telehealth (INDEPENDENT_AMBULATORY_CARE_PROVIDER_SITE_OTHER): Payer: 59 | Admitting: Internal Medicine

## 2020-03-02 ENCOUNTER — Telehealth: Payer: Self-pay | Admitting: Internal Medicine

## 2020-03-02 DIAGNOSIS — F339 Major depressive disorder, recurrent, unspecified: Secondary | ICD-10-CM

## 2020-03-02 DIAGNOSIS — F411 Generalized anxiety disorder: Secondary | ICD-10-CM

## 2020-03-02 DIAGNOSIS — F900 Attention-deficit hyperactivity disorder, predominantly inattentive type: Secondary | ICD-10-CM | POA: Diagnosis not present

## 2020-03-02 MED ORDER — ESCITALOPRAM OXALATE 10 MG PO TABS: 10.0000 mg | ORAL_TABLET | Freq: Every day | ORAL | 1 refills | Status: DC

## 2020-03-02 MED ORDER — AMPHETAMINE-DEXTROAMPHETAMINE 20 MG PO TABS: 20.0000 mg | ORAL_TABLET | Freq: Every day | ORAL | 0 refills | Status: DC

## 2020-03-02 NOTE — Progress Notes (Signed)
Virtual Visit via Telephone Note  I connected with Madison Tapia on 03/02/20 at  2:00 PM EDT by telephone and verified that I am speaking with the correct person using two identifiers.   I discussed the limitations, risks, security and privacy concerns of performing an evaluation and management service by telephone and the availability of in person appointments. I also discussed with the patient that there may be a patient responsible charge related to this service. The patient expressed understanding and agreed to proceed.  Location patient: home Location provider: work office Participants present for the call: patient, provider Patient did not have a visit in the prior 7 days to address this/these issue(s).   History of Present Illness:  This visit has been scheduled for medication refills. She is on Adderall 20 mg daily for ADHD. She has been doing well without any untoward side effects. She believes she needs a refill on her Lexapro as well, her mood is stable   Observations/Objective: Patient sounds cheerful and well on the phone. I do not appreciate any increased work of breathing. Speech and thought processing are grossly intact. Patient reported vitals: None reported   Current Outpatient Medications:  .  amphetamine-dextroamphetamine (ADDERALL) 20 MG tablet, Take 1 tablet (20 mg total) by mouth daily., Disp: 30 tablet, Rfl: 0 .  amphetamine-dextroamphetamine (ADDERALL) 20 MG tablet, Take 1 tablet (20 mg total) by mouth daily., Disp: 60 tablet, Rfl: 0 .  amphetamine-dextroamphetamine (ADDERALL) 20 MG tablet, Take 1 tablet (20 mg total) by mouth daily., Disp: 30 tablet, Rfl: 0 .  cholecalciferol (VITAMIN D3) 25 MCG (1000 UNIT) tablet, Take 1,000 Units by mouth daily., Disp: , Rfl:  .  diphenhydrAMINE (BENADRYL) 25 MG tablet, Take 25 mg by mouth daily as needed for allergies., Disp: , Rfl:  .  escitalopram (LEXAPRO) 10 MG tablet, Take 1 tablet (10 mg total) by mouth  daily., Disp: 90 tablet, Rfl: 1 .  ferrous sulfate 324 MG TBEC, Take 324 mg by mouth., Disp: , Rfl:  .  fluticasone (FLONASE) 50 MCG/ACT nasal spray, Place 2 sprays into both nostrils daily., Disp: 16 g, Rfl: 6 .  ibuprofen (ADVIL) 200 MG tablet, Take 400-800 mg by mouth every 6 (six) hours as needed for headache or moderate pain., Disp: , Rfl:  .  levothyroxine (EUTHYROX) 200 MCG tablet, TAKE 1 TABLET BY MOUTH ONCE DAILY BEFORE BREAKFAST, Disp: 90 tablet, Rfl: 1 .  SYRINGE/NEEDLE, DISP, 1 ML (B-D SYRINGE/NEEDLE 1CC/25GX5/8) 25G X 5/8" 1 ML MISC, Use for B12 injections, Disp: 100 each, Rfl: 3 .  Vitamin D, Ergocalciferol, (DRISDOL) 1.25 MG (50000 UNIT) CAPS capsule, Take 1 capsule (50,000 Units total) by mouth every 7 (seven) days for 12 doses., Disp: 12 capsule, Rfl: 0  Review of Systems:  Constitutional: Denies fever, chills, diaphoresis, appetite change and fatigue.  HEENT: Denies photophobia, eye pain, redness, hearing loss, ear pain, congestion, sore throat, rhinorrhea, sneezing, mouth sores, trouble swallowing, neck pain, neck stiffness and tinnitus.   Respiratory: Denies SOB, DOE, cough, chest tightness,  and wheezing.   Cardiovascular: Denies chest pain, palpitations and leg swelling.  Gastrointestinal: Denies nausea, vomiting, abdominal pain, diarrhea, constipation, blood in stool and abdominal distention.  Genitourinary: Denies dysuria, urgency, frequency, hematuria, flank pain and difficulty urinating.  Endocrine: Denies: hot or cold intolerance, sweats, changes in hair or nails, polyuria, polydipsia. Musculoskeletal: Denies myalgias, back pain, joint swelling, arthralgias and gait problem.  Skin: Denies pallor, rash and wound.  Neurological: Denies dizziness, seizures, syncope,  weakness, light-headedness, numbness and headaches.  Hematological: Denies adenopathy. Easy bruising, personal or family bleeding history  Psychiatric/Behavioral: Denies suicidal ideation, mood changes,  confusion, nervousness, sleep disturbance and agitation   Assessment and Plan:  Attention deficit hyperactivity disorder (ADHD), predominantly inattentive type  -PDMP has been reviewed, no red flags, overdose risk score is 280. -Refill Adderall 20 mg daily x3 months.  Depression, recurrent ([place])  - Plan: escitalopram (LEXAPRO) 10 MG tablet    I discussed the assessment and treatment plan with the patient. The patient was provided an opportunity to ask questions and all were answered. The patient agreed with the plan and demonstrated an understanding of the instructions.   The patient was advised to call back or seek an in-person evaluation if the symptoms worsen or if the condition fails to improve as anticipated.  I provided 13 minutes of non-face-to-face time during this encounter.   Lelon Frohlich, MD [facility]

## 2020-03-02 NOTE — Telephone Encounter (Signed)
Pt is calling in stating she needs amphetamine-dextroamphetamine (ADDERALL) 20 MG will be out on Sunday. Pharm:  [facility] on [address]

## 2020-03-02 NOTE — Telephone Encounter (Signed)
Spoke with patient and an appointment scheduled 

## 2020-04-04 ENCOUNTER — Encounter: Payer: Self-pay | Admitting: Internal Medicine

## 2020-04-10 ENCOUNTER — Other Ambulatory Visit: Payer: Self-pay | Admitting: Internal Medicine

## 2020-04-10 DIAGNOSIS — Z0000 Encounter for general adult medical examination without abnormal findings: Secondary | ICD-10-CM

## 2020-04-10 DIAGNOSIS — Z1231 Encounter for screening mammogram for malignant neoplasm of breast: Secondary | ICD-10-CM

## 2020-04-17 ENCOUNTER — Other Ambulatory Visit: Payer: Self-pay

## 2020-04-17 ENCOUNTER — Ambulatory Visit
Admission: RE | Admit: 2020-04-17 | Discharge: 2020-04-17 | Disposition: A | Discharge status: Home / Self Care | Payer: 59 | Source: Ambulatory Visit | Attending: Internal Medicine | Admitting: Internal Medicine

## 2020-04-17 DIAGNOSIS — Z1231 Encounter for screening mammogram for malignant neoplasm of breast: Secondary | ICD-10-CM

## 2020-05-03 ENCOUNTER — Ambulatory Visit: Payer: 59 | Admitting: Internal Medicine

## 2020-05-16 ENCOUNTER — Ambulatory Visit: Payer: 59 | Admitting: Internal Medicine

## 2020-05-16 ENCOUNTER — Other Ambulatory Visit: Payer: Self-pay

## 2020-05-16 ENCOUNTER — Encounter: Payer: Self-pay | Admitting: Internal Medicine

## 2020-05-16 VITALS — BP 112/88 | HR 88 | Temp 97.8°F | Wt 296.1 lb

## 2020-05-16 DIAGNOSIS — B029 Zoster without complications: Secondary | ICD-10-CM

## 2020-05-16 MED ORDER — VALACYCLOVIR HCL 1 G PO TABS: 1000.0000 mg | ORAL_TABLET | Freq: Three times a day (TID) | ORAL | 0 refills | Status: AC

## 2020-05-16 NOTE — Patient Instructions (Signed)
-Nice seeing you today!!  -Start Valtrex 1000 mg every 8 hours for 7 days.   Shingles  Shingles is an infection. It gives you a painful skin rash and blisters that have fluid in them. Shingles is caused by the same germ (virus) that causes chickenpox. Shingles only happens in people who:  Have had chickenpox.  Have been given a shot of medicine (vaccine) to protect against chickenpox. Shingles is rare in this group. The first symptoms of shingles may be itching, tingling, or pain in an area on your skin. A rash will show on your skin a few days or weeks later. The rash is likely to be on one side of your body. The rash usually has a shape like a belt or a band. Over time, the rash turns into fluid-filled blisters. The blisters will break open, change into scabs, and dry up. Medicines may:  Help with pain and itching.  Help you get better sooner.  Help to prevent long-term problems. Follow these instructions at home: Medicines  Take over-the-counter and prescription medicines only as told by your doctor.  Put on an anti-itch cream or numbing cream where you have a rash, blisters, or scabs. Do this as told by your doctor. Helping with itching and discomfort   Put cold, wet cloths (cold compresses) on the area of the rash or blisters as told by your doctor.  Cool baths can help you feel better. Try adding baking soda or dry oatmeal to the water to lessen itching. Do not bathe in hot water. Blister and rash care  Keep your rash covered with a loose bandage (dressing).  Wear loose clothing that does not rub on your rash.  Keep your rash and blisters clean. To do this, wash the area with mild soap and cool water as told by your doctor.  Check your rash every day for signs of infection. Check for: ? More redness, swelling, or pain. ? Fluid or blood. ? Warmth. ? Pus or a bad smell.  Do not scratch your rash. Do not pick at your blisters. To help you to not scratch: ? Keep your  fingernails clean and cut short. ? Wear gloves or mittens when you sleep, if scratching is a problem. General instructions  Rest as told by your doctor.  Keep all follow-up visits as told by your doctor. This is important.  Wash your hands often with soap and water. If soap and water are not available, use hand sanitizer. Doing this lowers your chance of getting a skin infection caused by germs (bacteria).  Your infection can cause chickenpox in people who have never had chickenpox or never got a shot of chickenpox vaccine. If you have blisters that did not change into scabs yet, try not to touch other people or be around other people, especially: ? Babies. ? Pregnant women. ? Children who have areas of red, itchy, or rough skin (eczema). ? Very old people who have transplants. ? People who have a long-term (chronic) sickness, like cancer or AIDS. Contact a doctor if:  Your pain does not get better with medicine.  Your pain does not get better after the rash heals.  You have any signs of infection in the rash area. These signs include: ? More redness, swelling, or pain around the rash. ? Fluid or blood coming from the rash. ? The rash area feeling warm to the touch. ? Pus or a bad smell coming from the rash. Get help right away if:  The  rash is on your face or nose.  You have pain in your face or pain by your eye.  You lose feeling on one side of your face.  You have trouble seeing.  You have ear pain, or you have ringing in your ear.  You have a loss of taste.  Your condition gets worse. Summary  Shingles gives you a painful skin rash and blisters that have fluid in them.  Shingles is an infection. It is caused by the same germ (virus) that causes chickenpox.  Keep your rash covered with a loose bandage (dressing). Wear loose clothing that does not rub on your rash.  If you have blisters that did not change into scabs yet, try not to touch other people or be around  people. This information is not intended to replace advice given to you by your health care provider. Make sure you discuss any questions you have with your health care provider. Document Revised: 01/07/2019 Document Reviewed: 05/20/2017 Elsevier Patient Education  2020 [employer].

## 2020-05-16 NOTE — Progress Notes (Signed)
Established Patient Office Visit     This visit occurred during the SARS-CoV-2 public health emergency.  Safety protocols were in place, including screening questions prior to the visit, additional usage of staff PPE, and extensive cleaning of exam room while observing appropriate contact time as indicated for disinfecting solutions.    CC/Reason for Visit: Rash and pain  HPI: Madison Tapia is a 49 y.o. female who is coming in today for the above mentioned reasons. She started experiencing left middle back pain over the weekend. Then she started itching. She has noted a vesicular rash in that area. She comes in today for evaluation. She has had no fever.   Past Medical/Surgical History: Past Medical History:  Diagnosis Date  . ADD 07/14/2007  . ANXIETY 06/24/2007  . COMMON MIGRAINE 06/24/2007  . DEPRESSION 06/24/2007  . Family history of adverse reaction to anesthesia    family -extreme nausea, hallucinations (mother)  . Headache(784.0) 10/08/2007  . HX, URINARY INFECTION 06/24/2007  . IRRITABLE BOWEL SYNDROME, HX OF 06/24/2007  . MALAISE AND FATIGUE 06/24/2007  . 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 Unspecified hypothyroidism 07/14/2007    Past Surgical History:  Procedure Laterality Date  . KIDNEY STONE SURGERY     basket extraction-age 20  . melanoma surgery  2018   from back  . ROBOTIC ASSISTED LAPAROSCOPIC HYSTERECTOMY AND SALPINGECTOMY Bilateral 05/24/2019   Procedure: ATTEMPTED XI ROBOTIC ASSISTED TOTAL LAPAROSCOPIC HYSTERECTOMY AND SALPINGECTOMY, CONVERTED TO OPEN  PROEDURE: TOTAL  ABDOMINAL HYSTERECTOMY WITH BILATERAL SALPINGECTOMY;  Surgeon: Princess Bruins, MD;  Location: [facility];  Service: Gynecology;  Laterality: Bilateral;  request 7:30am OR time in [place] block requests two hours Will contact [place] re: RNFA t    Social History:  reports that she has never smoked. She has never used smokeless tobacco. She reports current  alcohol use. She reports that she does not use drugs.  Allergies: Allergies  Allergen Reactions  . Sulfa Antibiotics Hives  . Sulfonamide Derivatives Hives    Family History:  Family History  Problem Relation Age of Onset  . Cancer Mother   . Thyroid disease Mother   . Breast cancer Mother 75  . Diabetes Father   . Hypertension Father   . Mental illness Father   . Depression Neg Hx        family hx     Current Outpatient Medications:  .  amphetamine-dextroamphetamine (ADDERALL) 20 MG tablet, Take 1 tablet (20 mg total) by mouth daily., Disp: 30 tablet, Rfl: 0 .  amphetamine-dextroamphetamine (ADDERALL) 20 MG tablet, Take 1 tablet (20 mg total) by mouth daily., Disp: 60 tablet, Rfl: 0 .  amphetamine-dextroamphetamine (ADDERALL) 20 MG tablet, Take 1 tablet (20 mg total) by mouth daily., Disp: 30 tablet, Rfl: 0 .  cholecalciferol (VITAMIN D3) 25 MCG (1000 UNIT) tablet, Take 1,000 Units by mouth daily., Disp: , Rfl:  .  diphenhydrAMINE (BENADRYL) 25 MG tablet, Take 25 mg by mouth daily as needed for allergies., Disp: , Rfl:  .  escitalopram (LEXAPRO) 10 MG tablet, Take 1 tablet (10 mg total) by mouth daily., Disp: 90 tablet, Rfl: 1 .  ferrous sulfate 324 MG TBEC, Take 324 mg by mouth., Disp: , Rfl:  .  fluticasone (FLONASE) 50 MCG/ACT nasal spray, Place 2 sprays into both nostrils daily., Disp: 16 g, Rfl: 6 .  ibuprofen (ADVIL) 200 MG tablet, Take 400-800 mg by mouth every 6 (six) hours as needed for headache or moderate pain., Disp: ,  Rfl:  .  levothyroxine (SYNTHROID) 200 MCG tablet, TAKE 1 TABLET BY MOUTH ONCE DAILY BEFORE BREAKFAST, Disp: 90 tablet, Rfl: 0 .  SYRINGE/NEEDLE, DISP, 1 ML (B-D SYRINGE/NEEDLE 1CC/25GX5/8) 25G X 5/8" 1 ML MISC, Use for B12 injections, Disp: 100 each, Rfl: 3 .  valACYclovir (VALTREX) 1000 MG tablet, Take 1 tablet (1,000 mg total) by mouth 3 (three) times daily for 7 days., Disp: 21 tablet, Rfl: 0  Review of Systems:  Constitutional: Denies fever,  chills, diaphoresis, appetite change and fatigue.  HEENT: Denies photophobia, eye pain, redness, hearing loss, ear pain, congestion, sore throat, rhinorrhea, sneezing, mouth sores, trouble swallowing, neck pain, neck stiffness and tinnitus.   Respiratory: Denies SOB, DOE, cough, chest tightness,  and wheezing.   Cardiovascular: Denies chest pain, palpitations and leg swelling.  Gastrointestinal: Denies nausea, vomiting, abdominal pain, diarrhea, constipation, blood in stool and abdominal distention.  Genitourinary: Denies dysuria, urgency, frequency, hematuria, flank pain and difficulty urinating.  Endocrine: Denies: hot or cold intolerance, sweats, changes in hair or nails, polyuria, polydipsia. Musculoskeletal: Denies myalgias, back pain, joint swelling, arthralgias and gait problem.  Skin: Denies pallor  Neurological: Denies dizziness, seizures, syncope, weakness, light-headedness, numbness and headaches.  Hematological: Denies adenopathy. Easy bruising, personal or family bleeding history  Psychiatric/Behavioral: Denies suicidal ideation, mood changes, confusion, nervousness, sleep disturbance and agitation    Physical Exam: Vitals:   05/16/20 1033  BP: 112/88  Pulse: 88  Temp: 97.8 F (36.6 C)  TempSrc: Oral  SpO2: 98%  Weight: 296 lb 1.6 oz (134.3 kg)    Body mass index is 46.38 kg/m.   Constitutional: NAD, calm, comfortable, obese Eyes: PERRL, lids and conjunctivae normal ENMT: Mucous membranes are moist.  Skin: Vesicular rash over mid left back extending into the axillary line, some of them are unroofed. Neurologic: Grossly intact and nonfocal Psychiatric: Normal judgment and insight. Alert and oriented x 3. Normal mood.    Impression and Plan:  Herpes zoster without complication  -Valtrex x7 days, follow-up if no improvement.  Morbid obesity -She has lost 16 to 20 pounds since her last visit, congratulated on success.    Patient Instructions  -Nice seeing  you today!!  -Start Valtrex 1000 mg every 8 hours for 7 days.   Shingles  Shingles is an infection. It gives you a painful skin rash and blisters that have fluid in them. Shingles is caused by the same germ (virus) that causes chickenpox. Shingles only happens in people who:  Have had chickenpox.  Have been given a shot of medicine (vaccine) to protect against chickenpox. Shingles is rare in this group. The first symptoms of shingles may be itching, tingling, or pain in an area on your skin. A rash will show on your skin a few days or weeks later. The rash is likely to be on one side of your body. The rash usually has a shape like a belt or a band. Over time, the rash turns into fluid-filled blisters. The blisters will break open, change into scabs, and dry up. Medicines may:  Help with pain and itching.  Help you get better sooner.  Help to prevent long-term problems. Follow these instructions at home: Medicines  Take over-the-counter and prescription medicines only as told by your doctor.  Put on an anti-itch cream or numbing cream where you have a rash, blisters, or scabs. Do this as told by your doctor. Helping with itching and discomfort   Put cold, wet cloths (cold compresses) on the area of the  rash or blisters as told by your doctor.  Cool baths can help you feel better. Try adding baking soda or dry oatmeal to the water to lessen itching. Do not bathe in hot water. Blister and rash care  Keep your rash covered with a loose bandage (dressing).  Wear loose clothing that does not rub on your rash.  Keep your rash and blisters clean. To do this, wash the area with mild soap and cool water as told by your doctor.  Check your rash every day for signs of infection. Check for: ? More redness, swelling, or pain. ? Fluid or blood. ? Warmth. ? Pus or a bad smell.  Do not scratch your rash. Do not pick at your blisters. To help you to not scratch: ? Keep your fingernails  clean and cut short. ? Wear gloves or mittens when you sleep, if scratching is a problem. General instructions  Rest as told by your doctor.  Keep all follow-up visits as told by your doctor. This is important.  Wash your hands often with soap and water. If soap and water are not available, use hand sanitizer. Doing this lowers your chance of getting a skin infection caused by germs (bacteria).  Your infection can cause chickenpox in people who have never had chickenpox or never got a shot of chickenpox vaccine. If you have blisters that did not change into scabs yet, try not to touch other people or be around other people, especially: ? Babies. ? Pregnant women. ? Children who have areas of red, itchy, or rough skin (eczema). ? Very old people who have transplants. ? People who have a long-term (chronic) sickness, like cancer or AIDS. Contact a doctor if:  Your pain does not get better with medicine.  Your pain does not get better after the rash heals.  You have any signs of infection in the rash area. These signs include: ? More redness, swelling, or pain around the rash. ? Fluid or blood coming from the rash. ? The rash area feeling warm to the touch. ? Pus or a bad smell coming from the rash. Get help right away if:  The rash is on your face or nose.  You have pain in your face or pain by your eye.  You lose feeling on one side of your face.  You have trouble seeing.  You have ear pain, or you have ringing in your ear.  You have a loss of taste.  Your condition gets worse. Summary  Shingles gives you a painful skin rash and blisters that have fluid in them.  Shingles is an infection. It is caused by the same germ (virus) that causes chickenpox.  Keep your rash covered with a loose bandage (dressing). Wear loose clothing that does not rub on your rash.  If you have blisters that did not change into scabs yet, try not to touch other people or be around  people. This information is not intended to replace advice given to you by your health care provider. Make sure you discuss any questions you have with your health care provider. Document Revised: 01/07/2019 Document Reviewed: 05/20/2017 Elsevier Patient Education  2020 [place], [place] [facility]

## 2020-07-12 ENCOUNTER — Other Ambulatory Visit: Payer: Self-pay | Admitting: Internal Medicine

## 2020-07-12 DIAGNOSIS — F900 Attention-deficit hyperactivity disorder, predominantly inattentive type: Secondary | ICD-10-CM

## 2020-07-12 NOTE — Telephone Encounter (Signed)
Pt call and want a refill on her amphetamine-dextroamphetamine (ADDERALL) 20 MG tablet sent to  [facility], [place] Phone:  [phone]  Fax:  [phone]

## 2020-07-15 ENCOUNTER — Other Ambulatory Visit: Payer: Self-pay | Admitting: Internal Medicine

## 2020-07-15 DIAGNOSIS — Z0000 Encounter for general adult medical examination without abnormal findings: Secondary | ICD-10-CM

## 2020-07-17 MED ORDER — AMPHETAMINE-DEXTROAMPHETAMINE 20 MG PO TABS: 20.0000 mg | ORAL_TABLET | Freq: Every day | ORAL | 0 refills | Status: DC

## 2020-07-17 NOTE — Telephone Encounter (Signed)
Needs visit for refills. Could be VV

## 2020-07-17 NOTE — Telephone Encounter (Signed)
Office visit 05/16/20.

## 2020-07-17 NOTE — Telephone Encounter (Signed)
Spoke with the pt and attempted to schedule an appt as below.  Patient declined scheduling and wanted to send a note to Dr Jerilee Hoh to "remind" her that she told her at the visit in August that she would not need another visit?  Message sent to PCP.

## 2020-07-17 NOTE — Telephone Encounter (Signed)
Can you check on her meds? If I saw her in August, ok to pend 3 month refills.

## 2020-07-19 ENCOUNTER — Encounter: Payer: Self-pay | Admitting: Internal Medicine

## 2020-07-19 ENCOUNTER — Ambulatory Visit: Payer: 59 | Admitting: Internal Medicine

## 2020-07-19 ENCOUNTER — Other Ambulatory Visit: Payer: Self-pay

## 2020-07-19 DIAGNOSIS — G4733 Obstructive sleep apnea (adult) (pediatric): Secondary | ICD-10-CM | POA: Diagnosis not present

## 2020-07-19 NOTE — Progress Notes (Signed)
02/01/20- 89 yoF never smoker for sleep evaluation on kind referral from Dr  Isaac Bliss Medical problem list includes Migraine, Hypothyroid, ADD, Depression, Morbid Obesity, B12 and Vit D deficiency,  Meds include adderall 20 mg, lexapro, levothyroxine,  Body weight today 312 lbs Epworth score - 12 2Phizer Covax Partner tells her of loud snoring. She is aware of restless sleep and daytime tiredness. She hs used occasional melatonin or Zzquil. Bedtime around 11PM, latency 10 minutes.Wakes x 4 for bathroom or sips of water. During the day has used adderall for years, no caffeine. Father had CPAP- now deceased. ENT- no surgery. Often has foreign body/ globus sensation in throat, but swallows ok. Denies GERD. Denies heart, lung or neurologic problem.  07/19/20- 43 yoF never smoker for sleep evaluation , complicated by   Migraine, Hypothyroid, ADD, Depression, Morbid Obesity, B12 and Vit D deficiency,  Meds include adderall 20 mg, lexapro, levothyroxine,  HST 02/06/20- AHI 41.2/ hr, desaturation to 80%, body weight 312 lbs Incidental Shingles in August- PCP Body weight today- 288 lbs Covid vax- 2 Phizer Flu vax- today standard -----pt is here to go over sleep study results We reviewed sleep study and discussed options.  Her return here had been delayed by Shingles after Covid vaccine- now resolved. She agrees to start CPAP  ROS-see HPI  + = positive Constitutional:    weight loss, night sweats, fevers, chills, fatigue, lassitude. HEENT:   + headaches, difficulty swallowing, tooth/dental problems, sore throat,       +sneezing, +itching, ear ache, +nasal congestion, post nasal drip, snoring CV:    chest pain, orthopnea, PND, swelling in lower extremities, anasarca,                                   dizziness, palpitations Resp:   +shortness of breath with exertion or at rest.                +productive cough,   +non-productive cough, coughing up of blood.              change in color of  mucus.  wheezing.   Skin:    rash or lesions. GI:  No-   heartburn, indigestion, abdominal pain, nausea, vomiting, diarrhea,                 change in bowel habits, loss of appetite GU: dysuria, change in color of urine, no urgency or frequency.   flank pain. MS:   joint pain, stiffness, decreased range of motion, back pain. Neuro-     nothing unusual Psych:  change in mood or affect. + depression or anxiety.   memory loss.  OBJ- Physical Exam General- Alert, Oriented, Affect-appropriate, Distress- none acute, + obese Skin- rash-none, lesions- none, excoriation- none Lymphadenopathy- none Head- atraumatic            Eyes- Gross vision intact, PERRLA, conjunctivae and secretions clear            Ears- Hearing, canals-normal            Nose- Clear, no-Septal dev, mucus, polyps, erosion, perforation             Throat- Mallampati II-III , mucosa clear , drainage- none, tonsils- atrophic, + teeth Neck- flexible , trachea midline, no stridor , thyroid nl, carotid no bruit Chest - symmetrical excursion , unlabored           Heart/CV- RRR ,  no murmur , no gallop  , no rub, nl s1 s2                           - JVD- none , edema- none, stasis changes- none, varices- none           Lung- clear to P&A, wheeze- none, cough- none , dullness-none, rub- none           Chest wall-  Abd-  Br/ Gen/ Rectal- Not done, not indicated Extrem- cyanosis- none, clubbing, none, atrophy- none, strength- nl Neuro- grossly intact to observation

## 2020-07-19 NOTE — Patient Instructions (Signed)
Order- new DME, new CPAP auto 5-20, mask of choice, humidifier, supplies, [facility]/ card  Please call if we can help  Order Flu vax standard

## 2020-08-05 NOTE — Assessment & Plan Note (Signed)
Discussed treatment options, driving safety, importance of weight Plan- start CPAP auto 5-20

## 2020-08-05 NOTE — Assessment & Plan Note (Addendum)
Body weight impacts OSA Hopefully she will be able to focus on lifestyle change toward weight loss Consider [facility]

## 2020-08-11 ENCOUNTER — Ambulatory Visit: Payer: 59

## 2020-10-03 IMAGING — MG DIGITAL SCREENING BILATERAL MAMMOGRAM WITH TOMO AND CAD
6 of 10 series · 6 of 30 positions shown · non-contrast
Comparison: Previous exam(s).

ACR Breast Density Category a: The breast tissue is almost entirely
fatty.

CLINICAL DATA: Screening.

EXAM:
DIGITAL SCREENING BILATERAL MAMMOGRAM WITH TOMO AND CAD

[R MLO synth-2D (1 of 2)]
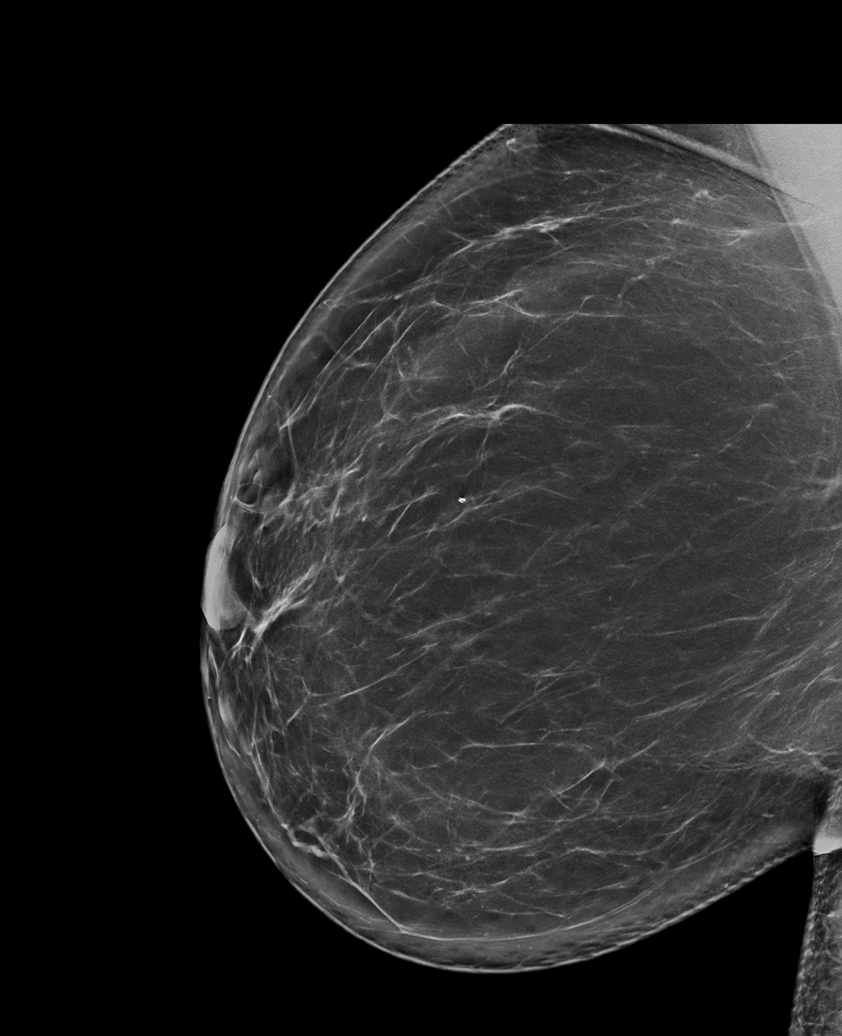

[L MLO synth-2D]
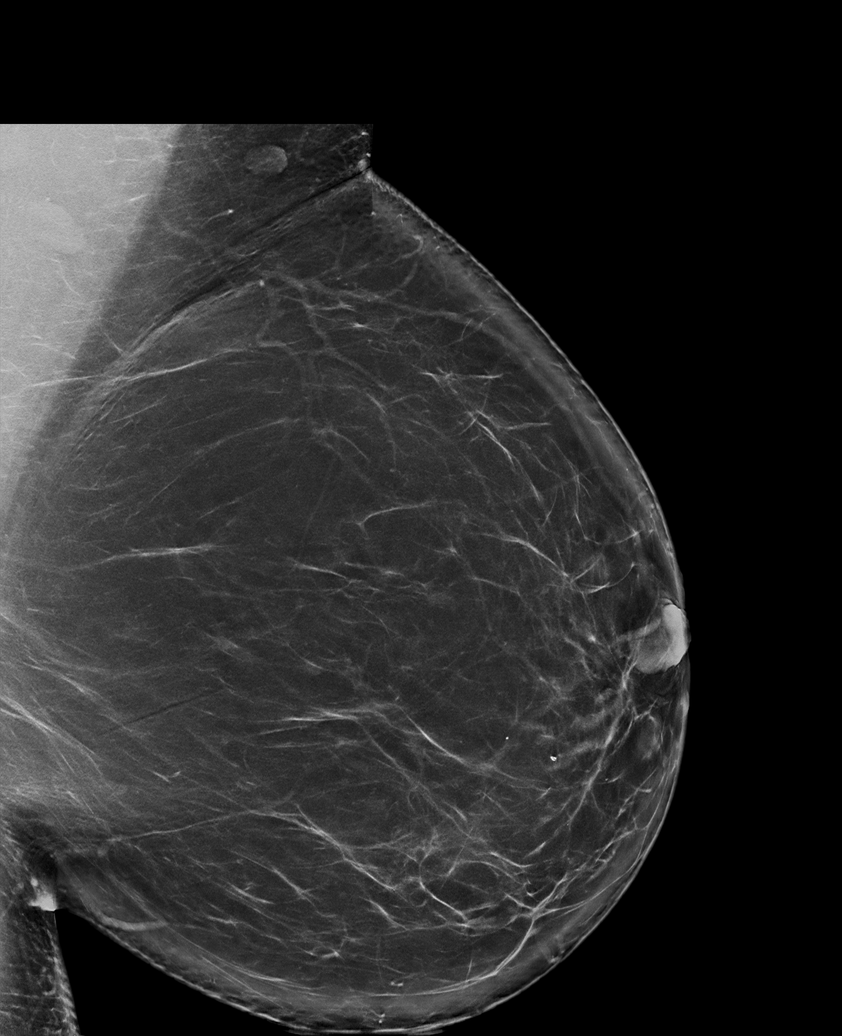

[L CC synth-2D]
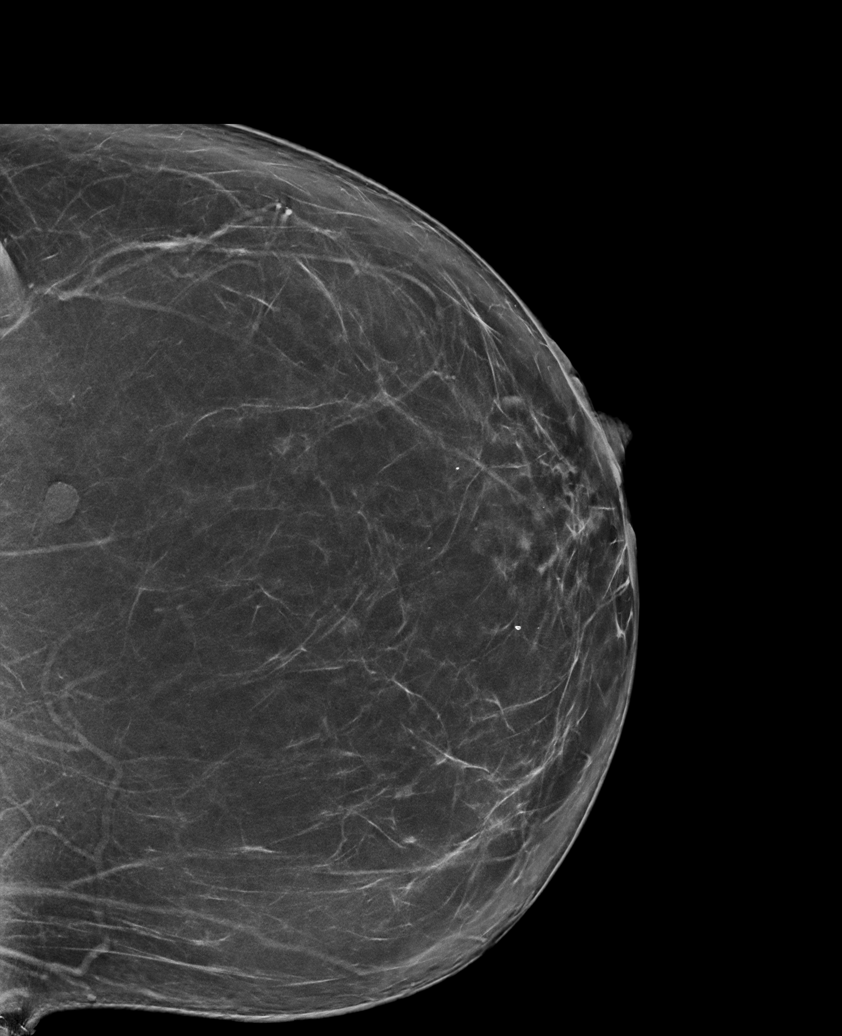

[R MLO synth-2D (2 of 2)]
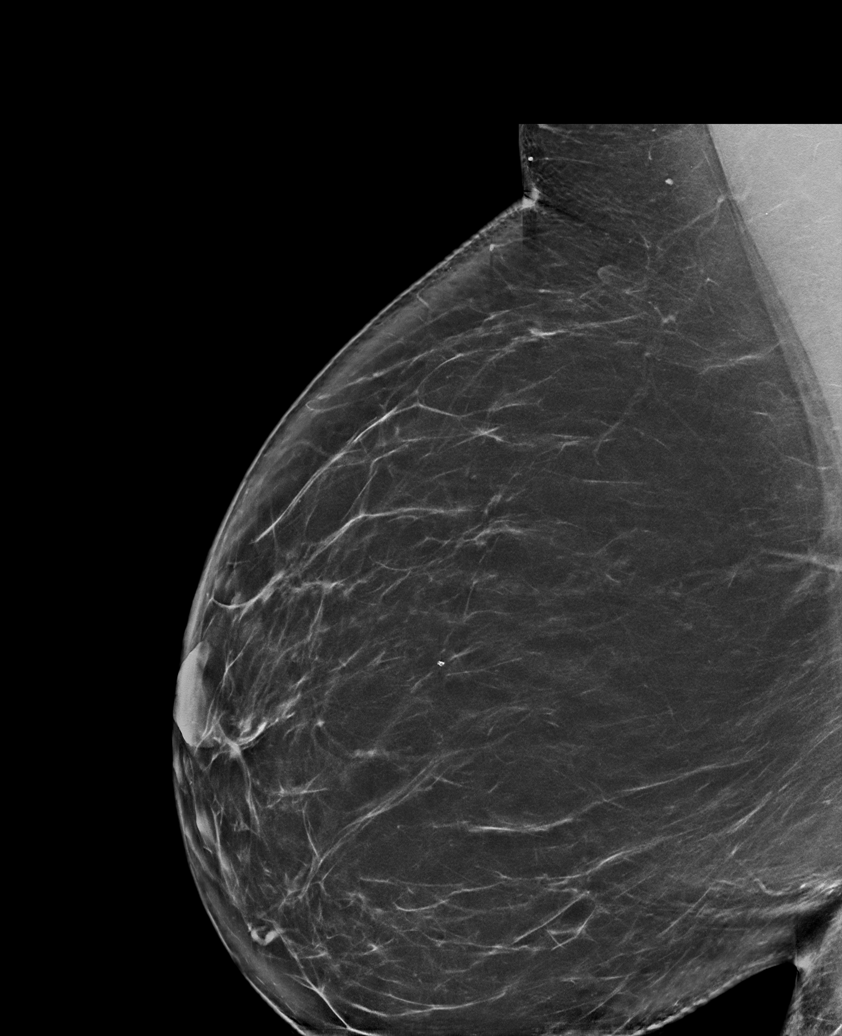

[R CC synth-2D]
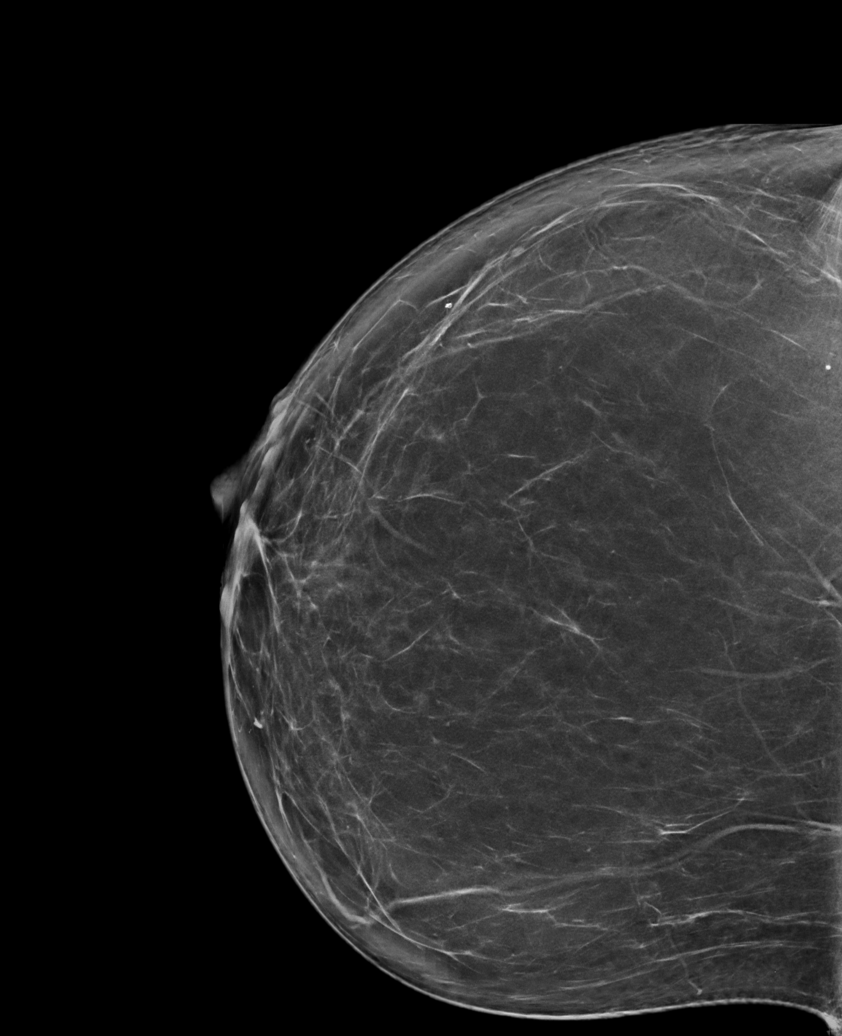

[L CC tomo · tomo slice 46/91.0]
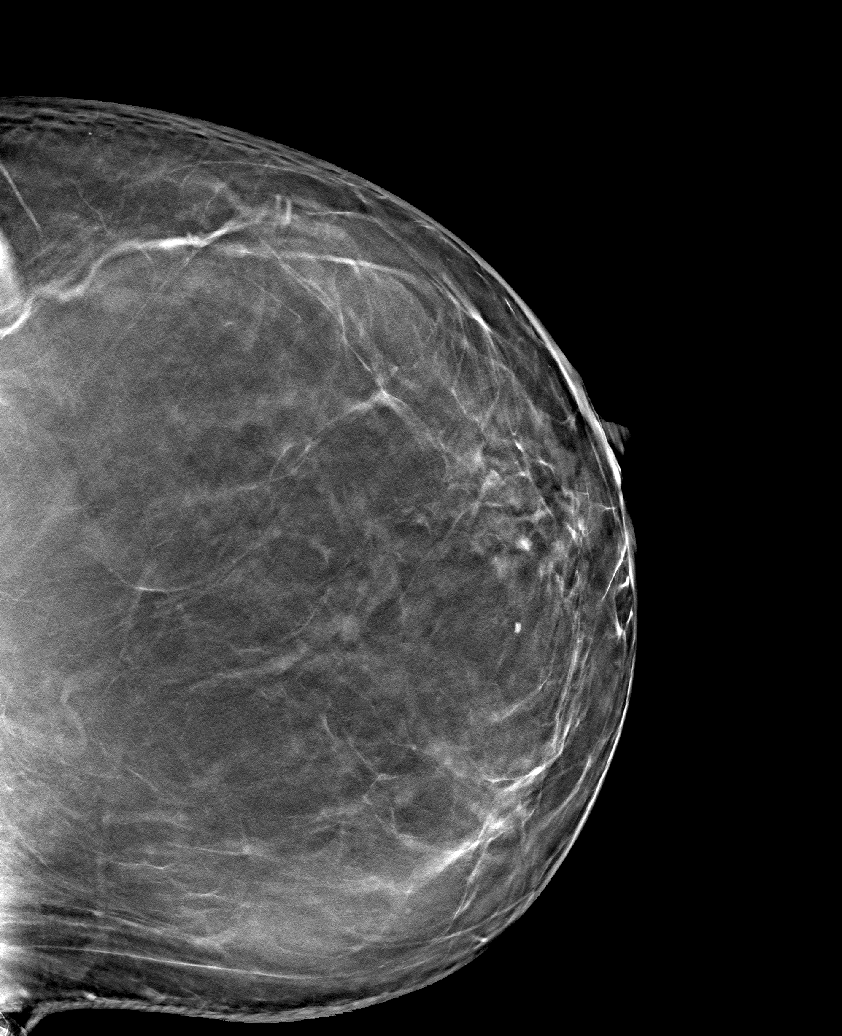

[6 of 30 positions shown; findings below may reference images not displayed]

FINDINGS: There are no findings suspicious for malignancy. Images were
processed with CAD.
IMPRESSION: No mammographic evidence of malignancy. A result letter of this
screening mammogram will be mailed directly to the patient.

RECOMMENDATION:
Screening mammogram in one year. (Code:[id number])

BI-RADS CATEGORY  1: Negative.

## 2020-10-09 ENCOUNTER — Ambulatory Visit: Payer: 59

## 2020-10-11 ENCOUNTER — Ambulatory Visit: Payer: 59 | Attending: Internal Medicine

## 2020-10-11 ENCOUNTER — Ambulatory Visit: Payer: 59

## 2020-10-11 DIAGNOSIS — Z23 Encounter for immunization: Secondary | ICD-10-CM

## 2020-10-11 NOTE — Progress Notes (Signed)
   Covid-19 Vaccination Clinic  Name:  KILYN MARAGH    MRN: [id number] DOB: [birth year]  10/11/2020  Ms. Raso was observed post Covid-19 immunization for 15 minutes without incident. She was provided with Vaccine Information Sheet and instruction to access the V-Safe system.   Ms. Stinson was instructed to call 911 with any severe reactions post vaccine: Marland Kitchen Difficulty breathing  . Swelling of face and throat  . A fast heartbeat  . A bad rash all over body  . Dizziness and weakness   Immunizations Administered    Name Date Dose VIS Date Route   Pfizer COVID-19 Vaccine 10/11/2020  4:11 PM 0.3 mL 07/18/2020 Intramuscular   Manufacturer: [place]   Lot: [id number]   NDC: [address]-3

## 2020-10-18 ENCOUNTER — Telehealth: Payer: Self-pay | Admitting: Internal Medicine

## 2020-10-18 DIAGNOSIS — Z0000 Encounter for general adult medical examination without abnormal findings: Secondary | ICD-10-CM

## 2020-10-18 DIAGNOSIS — F900 Attention-deficit hyperactivity disorder, predominantly inattentive type: Secondary | ICD-10-CM

## 2020-10-18 NOTE — Progress Notes (Deleted)
HPI F never smoker for sleep evaluation , complicated by   Migraine, Hypothyroid, ADD, Depression, Morbid Obesity, B12 and Vit D deficiency,  Meds include adderall 20 mg, lexapro, levothyroxine,  HST 02/06/20- AHI 41.2/ hr, desaturation to 80%, body weight 312 lbs  =================================================================   07/19/20- 75 yoF never smoker for sleep evaluation , complicated by   Migraine, Hypothyroid, ADD, Depression, Morbid Obesity, B12 and Vit D deficiency,  Meds include adderall 20 mg, lexapro, levothyroxine,  HST 02/06/20- AHI 41.2/ hr, desaturation to 80%, body weight 312 lbs Incidental Shingles in August- PCP Body weight today- 288 lbs Covid vax- 2 Phizer Flu vax- today standard -----pt is here to go over sleep study results We reviewed sleep study and discussed options.  Her return here had been delayed by Shingles after Covid vaccine- now resolved. She agrees to start CPAP  10/22/20- 49 yoF never smoker for sleep evaluation , complicated by   Migraine, Hypothyroid, ADD, Depression, Morbid Obesity, B12 and Vit D deficiency,  Meds include adderall 20 mg, lexapro, levothyroxine, CPAP auto 5-20 ordered 07/19/21-      ???? Don't see referral [place]- Body weight today- covid vax- Flu vax-    ROS-see HPI  + = positive Constitutional:    weight loss, night sweats, fevers, chills, fatigue, lassitude. HEENT:   + headaches, difficulty swallowing, tooth/dental problems, sore throat,       +sneezing, +itching, ear ache, +nasal congestion, post nasal drip, snoring CV:    chest pain, orthopnea, PND, swelling in lower extremities, anasarca,                                   dizziness, palpitations Resp:   +shortness of breath with exertion or at rest.                +productive cough,   +non-productive cough, coughing up of blood.              change in color of mucus.  wheezing.   Skin:    rash or lesions. GI:  No-   heartburn, indigestion, abdominal pain,  nausea, vomiting, diarrhea,                 change in bowel habits, loss of appetite GU: dysuria, change in color of urine, no urgency or frequency.   flank pain. MS:   joint pain, stiffness, decreased range of motion, back pain. Neuro-     nothing unusual Psych:  change in mood or affect. + depression or anxiety.   memory loss.  OBJ- Physical Exam General- Alert, Oriented, Affect-appropriate, Distress- none acute, + obese Skin- rash-none, lesions- none, excoriation- none Lymphadenopathy- none Head- atraumatic            Eyes- Gross vision intact, PERRLA, conjunctivae and secretions clear            Ears- Hearing, canals-normal            Nose- Clear, no-Septal dev, mucus, polyps, erosion, perforation             Throat- Mallampati II-III , mucosa clear , drainage- none, tonsils- atrophic, + teeth Neck- flexible , trachea midline, no stridor , thyroid nl, carotid no bruit Chest - symmetrical excursion , unlabored           Heart/CV- RRR , no murmur , no gallop  , no rub, nl s1 s2                           -  JVD- none , edema- none, stasis changes- none, varices- none           Lung- clear to P&A, wheeze- none, cough- none , dullness-none, rub- none           Chest wall-  Abd-  Br/ Gen/ Rectal- Not done, not indicated Extrem- cyanosis- none, clubbing, none, atrophy- none, strength- nl Neuro- grossly intact to observation

## 2020-10-18 NOTE — Telephone Encounter (Signed)
Last OV 05/16/20 Last refill 07/17/20 #30/2 Next OV not scheduled

## 2020-10-18 NOTE — Telephone Encounter (Signed)
Patient is calling and requesting a refill for amphetamine-dextroamphetamine (ADDERALL) 20 MG tablet sent to [facility] [place], [place] [address]  Phone:  [phone] Fax:  [phone]  CB is [phone]

## 2020-10-19 ENCOUNTER — Telehealth: Payer: Self-pay | Admitting: Internal Medicine

## 2020-10-19 NOTE — Telephone Encounter (Signed)
I have called and left a detailed message on her VM to make her aware that the DME's are back logged with cpap orders and they are trying their best to get these orders filled.  I advised her to follow up with the DME company that we sent the order to just to find out the time frame for the cpap machine for her.

## 2020-10-22 ENCOUNTER — Ambulatory Visit: Payer: 59 | Admitting: Internal Medicine

## 2020-11-02 NOTE — Telephone Encounter (Signed)
Yes, schedule VV

## 2020-11-02 NOTE — Telephone Encounter (Signed)
Pt is calling in to see if she can get a refill on her Rx amphetamine-dextroamphetamine (ADDERALL) 20 MG she is out.  Pharm:  [facility] on [address]

## 2020-11-02 NOTE — Telephone Encounter (Signed)
Last office visit 05/16/20.  VV?

## 2020-11-06 NOTE — Telephone Encounter (Signed)
Virtual visit scheduled.  

## 2020-11-07 ENCOUNTER — Telehealth (INDEPENDENT_AMBULATORY_CARE_PROVIDER_SITE_OTHER): Payer: 59 | Admitting: Internal Medicine

## 2020-11-07 ENCOUNTER — Other Ambulatory Visit: Payer: Self-pay

## 2020-11-07 VITALS — Wt 277.2 lb

## 2020-11-07 DIAGNOSIS — Z1211 Encounter for screening for malignant neoplasm of colon: Secondary | ICD-10-CM

## 2020-11-07 DIAGNOSIS — F900 Attention-deficit hyperactivity disorder, predominantly inattentive type: Secondary | ICD-10-CM

## 2020-11-07 MED ORDER — AMPHETAMINE-DEXTROAMPHETAMINE 20 MG PO TABS: 20.0000 mg | ORAL_TABLET | Freq: Every day | ORAL | 0 refills | Status: DC

## 2020-11-07 NOTE — Progress Notes (Signed)
Virtual Visit via Video Note  I connected with Madison Tapia on 11/07/20 at  2:30 PM EST by a video enabled telemedicine application and verified that I am speaking with the correct person using two identifiers.  Location patient: home Location provider: work office Persons participating in the virtual visit: patient, provider  I discussed the limitations of evaluation and management by telemedicine and the availability of in person appointments. The patient expressed understanding and agreed to proceed.   HPI: This visit has been scheduled for the purpose of Adderall refills for ADHD as per controlled substance contract.  She has been tolerating medication well, no side effects.  Since I last saw her she had her sleep study and CPAP has been recommended, unfortunately they are on back order and she is still awaiting for it.  Since I last saw her she also had a Covid booster.   ROS: Constitutional: Denies fever, chills, diaphoresis, appetite change and fatigue.  HEENT: Denies photophobia, eye pain, redness, hearing loss, ear pain, congestion, sore throat, rhinorrhea, sneezing, mouth sores, trouble swallowing, neck pain, neck stiffness and tinnitus.   Respiratory: Denies SOB, DOE, cough, chest tightness,  and wheezing.   Cardiovascular: Denies chest pain, palpitations and leg swelling.  Gastrointestinal: Denies nausea, vomiting, abdominal pain, diarrhea, constipation, blood in stool and abdominal distention.  Genitourinary: Denies dysuria, urgency, frequency, hematuria, flank pain and difficulty urinating.  Endocrine: Denies: hot or cold intolerance, sweats, changes in hair or nails, polyuria, polydipsia. Musculoskeletal: Denies myalgias, back pain, joint swelling, arthralgias and gait problem.  Skin: Denies pallor, rash and wound.  Neurological: Denies dizziness, seizures, syncope, weakness, light-headedness, numbness and headaches.  Hematological: Denies adenopathy. Easy  bruising, personal or family bleeding history  Psychiatric/Behavioral: Denies suicidal ideation, mood changes, confusion, nervousness, sleep disturbance and agitation   Past Medical History:  Diagnosis Date  . ADD 07/14/2007  . ANXIETY 06/24/2007  . COMMON MIGRAINE 06/24/2007  . DEPRESSION 06/24/2007  . Family history of adverse reaction to anesthesia    family -extreme nausea, hallucinations (mother)  . Headache(784.0) 10/08/2007  . HX, URINARY INFECTION 06/24/2007  . IRRITABLE BOWEL SYNDROME, HX OF 06/24/2007  . MALAISE AND FATIGUE 06/24/2007  . 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 Unspecified hypothyroidism 07/14/2007    Past Surgical History:  Procedure Laterality Date  . KIDNEY STONE SURGERY     basket extraction-age 35  . melanoma surgery  2018   from back  . ROBOTIC ASSISTED LAPAROSCOPIC HYSTERECTOMY AND SALPINGECTOMY Bilateral 05/24/2019   Procedure: ATTEMPTED XI ROBOTIC ASSISTED TOTAL LAPAROSCOPIC HYSTERECTOMY AND SALPINGECTOMY, CONVERTED TO OPEN  PROEDURE: TOTAL  ABDOMINAL HYSTERECTOMY WITH BILATERAL SALPINGECTOMY;  Surgeon: Princess Bruins, MD;  Location: [facility];  Service: Gynecology;  Laterality: Bilateral;  request 7:30am OR time in [place] Gyn block requests two hours Will contact Chassity re: RNFA t    Family History  Problem Relation Age of Onset  . Cancer Mother   . Thyroid disease Mother   . Breast cancer Mother 6  . Diabetes Father   . Hypertension Father   . Mental illness Father   . Depression Neg Hx        family hx    SOCIAL HX:   reports that she has never smoked. She has never used smokeless tobacco. She reports current alcohol use. She reports that she does not use drugs.   Current Outpatient Medications:  .  amphetamine-dextroamphetamine (ADDERALL) 20 MG tablet, Take 1 tablet (20 mg total) by mouth daily.,  Disp: 30 tablet, Rfl: 0 .  amphetamine-dextroamphetamine (ADDERALL) 20 MG tablet, Take 1 tablet (20 mg total) by mouth  daily., Disp: 30 tablet, Rfl: 0 .  amphetamine-dextroamphetamine (ADDERALL) 20 MG tablet, Take 1 tablet (20 mg total) by mouth daily., Disp: 30 tablet, Rfl: 0 .  EUTHYROX 200 MCG tablet, TAKE 1 TABLET BY MOUTH ONCE DAILY BEFORE BREAKFAST, Disp: 90 tablet, Rfl: 0 .  ferrous sulfate 324 MG TBEC, Take 324 mg by mouth., Disp: , Rfl:  .  fluticasone (FLONASE) 50 MCG/ACT nasal spray, Place 2 sprays into both nostrils daily., Disp: 16 g, Rfl: 6 .  ibuprofen (ADVIL) 200 MG tablet, Take 400-800 mg by mouth every 6 (six) hours as needed for headache or moderate pain., Disp: , Rfl:  .  SYRINGE/NEEDLE, DISP, 1 ML (B-D SYRINGE/NEEDLE 1CC/25GX5/8) 25G X 5/8" 1 ML MISC, Use for B12 injections, Disp: 100 each, Rfl: 3  EXAM:   VITALS per patient if applicable: None reported  GENERAL: alert, oriented, appears well and in no acute distress  HEENT: atraumatic, conjunttiva clear, no obvious abnormalities on inspection of external nose and ears  NECK: normal movements of the head and neck  LUNGS: on inspection no signs of respiratory distress, breathing rate appears normal, no obvious gross increased work of breathing, gasping or wheezing  CV: no obvious cyanosis  MS: moves all visible extremities without noticeable abnormality  PSYCH/NEURO: pleasant and cooperative, no obvious depression or anxiety, speech and thought processing grossly intact  ASSESSMENT AND PLAN:   Attention deficit hyperactivity disorder (ADHD), predominantly inattentive type -PDMP reviewed, no red flags, overdose risk score is 190. -Refill Adderall 20 mg to take 1 tablet daily for 30 tablets a month x3 months.  Screening for malignant neoplasm of colon  - Plan: Ambulatory referral to Gastroenterology     I discussed the assessment and treatment plan with the patient. The patient was provided an opportunity to ask questions and all were answered. The patient agreed with the plan and demonstrated an understanding of the  instructions.   The patient was advised to call back or seek an in-person evaluation if the symptoms worsen or if the condition fails to improve as anticipated.    Lelon Frohlich, MD  [facility]

## 2020-12-03 ENCOUNTER — Encounter: Payer: Self-pay | Admitting: Internal Medicine

## 2020-12-05 ENCOUNTER — Telehealth: Payer: Self-pay

## 2020-12-05 NOTE — Telephone Encounter (Signed)
Patient rescheduled previsit to 12/06/20.

## 2020-12-05 NOTE — Telephone Encounter (Signed)
Multiple attempts made to reach patient;  Left message for patient to call back to reschedule pre visit appt;

## 2020-12-06 ENCOUNTER — Other Ambulatory Visit: Payer: Self-pay | Admitting: Internal Medicine

## 2020-12-06 ENCOUNTER — Ambulatory Visit: Payer: 59

## 2020-12-06 ENCOUNTER — Other Ambulatory Visit: Payer: Self-pay

## 2020-12-06 ENCOUNTER — Telehealth: Payer: Self-pay

## 2020-12-06 VITALS — Ht 66.0 in | Wt 270.0 lb

## 2020-12-06 DIAGNOSIS — Z1211 Encounter for screening for malignant neoplasm of colon: Secondary | ICD-10-CM

## 2020-12-06 MED ORDER — PLENVU 140 G PO SOLR: 1.0000 | ORAL | 0 refills | Status: DC

## 2020-12-06 NOTE — Telephone Encounter (Signed)
See other notes

## 2020-12-06 NOTE — Progress Notes (Signed)
VIRTUAL PV  No egg or soy allergy known to patient  No issues with past sedation with any surgeries or procedures No intubation problems in the past  No FH of Malignant Hyperthermia No diet pills per patient No home 02 use per patient  No blood thinners per patient  Pt denies issues with constipation  No A fib or A flutter  EMMI video to pt or via [place] 19 guidelines implemented in PV today with Pt and RN  Pt is fully vaccinated  for Covid   PLENVU Coupon given to pt in PV today , Code to Pharmacy and  NO PA's for preps discussed with pt In PV today  Discussed with pt there will be an out-of-pocket cost for prep and that varies from $0 to 70 dollars   Due to the COVID-19 pandemic we are asking patients to follow certain guidelines.  Pt aware of COVID protocols and [facility] guidelines

## 2020-12-06 NOTE — Telephone Encounter (Signed)
Received notification the Plenvu prep was on backorder and requested an alternative.  Sutab prescription sent to pharmacy.  Patient called and given new instructions to be mailed and new instructions were sent via [place].  Patient verbalized understanding.

## 2020-12-10 ENCOUNTER — Encounter: Payer: Self-pay | Admitting: Internal Medicine

## 2020-12-21 ENCOUNTER — Encounter: Payer: Self-pay | Admitting: Internal Medicine

## 2020-12-21 ENCOUNTER — Ambulatory Visit (AMBULATORY_SURGERY_CENTER): Payer: 59 | Admitting: Internal Medicine

## 2020-12-21 ENCOUNTER — Other Ambulatory Visit: Payer: Self-pay

## 2020-12-21 VITALS — BP 122/65 | HR 70 | Temp 97.1°F | Resp 21 | Ht 66.0 in | Wt 270.0 lb

## 2020-12-21 DIAGNOSIS — D123 Benign neoplasm of transverse colon: Secondary | ICD-10-CM | POA: Diagnosis not present

## 2020-12-21 DIAGNOSIS — D175 Benign lipomatous neoplasm of intra-abdominal organs: Secondary | ICD-10-CM

## 2020-12-21 DIAGNOSIS — D125 Benign neoplasm of sigmoid colon: Secondary | ICD-10-CM

## 2020-12-21 DIAGNOSIS — D120 Benign neoplasm of cecum: Secondary | ICD-10-CM | POA: Diagnosis not present

## 2020-12-21 DIAGNOSIS — Z1211 Encounter for screening for malignant neoplasm of colon: Secondary | ICD-10-CM | POA: Diagnosis present

## 2020-12-21 DIAGNOSIS — K635 Polyp of colon: Secondary | ICD-10-CM | POA: Diagnosis not present

## 2020-12-21 MED ORDER — SODIUM CHLORIDE 0.9 % IV SOLN: 500.0000 mL | Freq: Once | INTRAVENOUS | Status: DC

## 2020-12-21 NOTE — Patient Instructions (Signed)
Handouts given>:  Hemorrhoids, Diverticulosis, polyps Resume previous diet Continue current medications Await pathology results  YOU HAD AN ENDOSCOPIC PROCEDURE TODAY AT [facility]:   Refer to the procedure report that was given to you for any specific questions about what was found during the examination.  If the procedure report does not answer your questions, please call your gastroenterologist to clarify.  If you requested that your care partner not be given the details of your procedure findings, then the procedure report has been included in a sealed envelope for you to review at your convenience later.  YOU SHOULD EXPECT: Some feelings of bloating in the abdomen. Passage of more gas than usual.  Walking can help get rid of the air that was put into your GI tract during the procedure and reduce the bloating. If you had a lower endoscopy (such as a colonoscopy or flexible sigmoidoscopy) you may notice spotting of blood in your stool or on the toilet paper. If you underwent a bowel prep for your procedure, you may not have a normal bowel movement for a few days.  Please Note:  You might notice some irritation and congestion in your nose or some drainage.  This is from the oxygen used during your procedure.  There is no need for concern and it should clear up in a day or so.  SYMPTOMS TO REPORT IMMEDIATELY:   Following lower endoscopy (colonoscopy or flexible sigmoidoscopy):  Excessive amounts of blood in the stool  Significant tenderness or worsening of abdominal pains  Swelling of the abdomen that is new, acute  Fever of 100F or higher  For urgent or emergent issues, a gastroenterologist can be reached at any hour by calling [phone]. Do not use MyChart messaging for urgent concerns.   DIET:  We do recommend a small meal at first, but then you may proceed to your regular diet.  Drink plenty of fluids but you should avoid alcoholic beverages for 24  hours.  ACTIVITY:  You should plan to take it easy for the rest of today and you should NOT DRIVE or use heavy machinery until tomorrow (because of the sedation medicines used during the test).    FOLLOW UP: Our staff will call the number listed on your records 48-72 hours following your procedure to check on you and address any questions or concerns that you may have regarding the information given to you following your procedure. If we do not reach you, we will leave a message.  We will attempt to reach you two times.  During this call, we will ask if you have developed any symptoms of COVID 19. If you develop any symptoms (ie: fever, flu-like symptoms, shortness of breath, cough etc.) before then, please call [phone].  If you test positive for Covid 19 in the 2 weeks post procedure, please call and report this information to [place].    If any biopsies were taken you will be contacted by phone or by letter within the next 1-3 weeks.  Please call us at [phone] if you have not heard about the biopsies in 3 weeks.   SIGNATURES/CONFIDENTIALITY: You and/or your care partner have signed paperwork which will be entered into your electronic medical record.  These signatures attest to the fact that that the information above on your After Visit Summary has been reviewed and is understood.  Full responsibility of the confidentiality of this discharge information lies with you and/or your care-partner.

## 2020-12-21 NOTE — Progress Notes (Signed)
PT taken to PACU. Monitors in place. VSS. Report given to RN. 

## 2020-12-21 NOTE — Progress Notes (Signed)
Medical history reviewed with no changes noted since PV. VS assessed by C.W 

## 2020-12-21 NOTE — Progress Notes (Signed)
Called to room to assist during endoscopic procedure.  Patient ID and intended procedure confirmed with present staff. Received instructions for my participation in the procedure from the performing physician.  

## 2020-12-21 NOTE — Op Note (Signed)
[facility] Patient Name: Madison Tapia Procedure Date: 12/21/2020 9:44 AM MRN: [id number] Endoscopist: Jerene Bears , MD Age: 50 Referring MD:  Date of Birth: 01/24/1971 Gender: Female Account #: [id number] Procedure:                Colonoscopy Indications:              Screening for colorectal malignant neoplasm, This                            is the patient's first colonoscopy Medicines:                Monitored Anesthesia Care Procedure:                Pre-Anesthesia Assessment:                           - Prior to the procedure, a History and Physical                            was performed, and patient medications and                            allergies were reviewed. The patient's tolerance of                            previous anesthesia was also reviewed. The risks                            and benefits of the procedure and the sedation                            options and risks were discussed with the patient.                            All questions were answered, and informed consent                            was obtained. Prior Anticoagulants: The patient has                            taken no previous anticoagulant or antiplatelet                            agents. ASA Grade Assessment: III - A patient with                            severe systemic disease. After reviewing the risks                            and benefits, the patient was deemed in                            satisfactory condition to undergo the procedure.  After obtaining informed consent, the colonoscope                            was passed under direct vision. Throughout the                            procedure, the patient's blood pressure, pulse, and                            oxygen saturations were monitored continuously. The                            Olympus CF-HQ190L [id number]) Colonoscope was                            introduced through the  anus and advanced to the                            cecum, identified by appendiceal orifice and                            ileocecal valve. The colonoscopy was performed                            without difficulty. The patient tolerated the                            procedure well. The quality of the bowel                            preparation was good. The ileocecal valve,                            appendiceal orifice, and rectum were photographed. Scope In: 9:55:38 AM Scope Out: 10:11:07 AM Scope Withdrawal Time: 0 hours 14 minutes 28 seconds  Total Procedure Duration: 0 hours 15 minutes 29 seconds  Findings:                 The digital rectal exam was normal.                           A 2 mm polyp was found in the cecum. The polyp was                            sessile. The polyp was removed with a cold snare.                            Resection and retrieval were complete.                           An area of granular mucosa was found at the                            ileocecal valve. This appears to be ileal  tissue                            extending onto the distal ileocecal valve. Biopsies                            were taken with a cold forceps for histology to                            exclude adenoma.                           A 4 mm polyp was found in the transverse colon. The                            polyp was sessile. The polyp was removed with a                            cold snare. Resection and retrieval were complete.                           A 6 mm polyp was found in the sigmoid colon. The                            polyp was sessile. The polyp was removed with a                            cold snare. Resection and retrieval were complete.                           Multiple small and large-mouthed diverticula were                            found in the sigmoid colon and descending colon.                           Internal hemorrhoids were found during                             retroflexion. The hemorrhoids were small. Complications:            No immediate complications. Estimated Blood Loss:     Estimated blood loss was minimal. Impression:               - One 2 mm polyp in the cecum, removed with a cold                            snare. Resected and retrieved.                           - Granularity at the ileocecal valve. Ileal tissue                            versus adenoma. Biopsied.                           -  One 4 mm polyp in the transverse colon, removed                            with a cold snare. Resected and retrieved.                           - One 6 mm polyp in the sigmoid colon, removed with                            a cold snare. Resected and retrieved.                           - Diverticulosis in the sigmoid colon and in the                            descending colon.                           - Small internal hemorrhoids. Recommendation:           - Patient has a contact number available for                            emergencies. The signs and symptoms of potential                            delayed complications were discussed with the                            patient. Return to normal activities tomorrow.                            Written discharge instructions were provided to the                            patient.                           - Resume previous diet.                           - Continue present medications.                           - Await pathology results.                           - Repeat colonoscopy is recommended. The                            colonoscopy date will be determined after pathology                            results from today's exam become available for  review. Jerene Bears, MD 12/21/2020 10:23:00 AM This report has been signed electronically.

## 2020-12-25 ENCOUNTER — Telehealth: Payer: Self-pay | Admitting: *Deleted

## 2020-12-25 NOTE — Telephone Encounter (Signed)
  Follow up Call-  Call back number 12/21/2020  Post procedure Call Back phone  # [phone]  Permission to leave phone message Yes  Some recent data might be hidden     Patient questions:  Do you have a fever, pain , or abdominal swelling? No. Pain Score  0 *  Have you tolerated food without any problems? Yes.    Have you been able to return to your normal activities? Yes.    Do you have any questions about your discharge instructions: Diet   No. Medications  No. Follow up visit  No.  Do you have questions or concerns about your Care? No.  Actions: * If pain score is 4 or above: No action needed, pain <4

## 2021-01-03 ENCOUNTER — Encounter: Payer: Self-pay | Admitting: Internal Medicine

## 2021-01-08 ENCOUNTER — Other Ambulatory Visit: Payer: Self-pay | Admitting: *Deleted

## 2021-01-08 DIAGNOSIS — Z0000 Encounter for general adult medical examination without abnormal findings: Secondary | ICD-10-CM

## 2021-01-08 MED ORDER — LEVOTHYROXINE SODIUM 200 MCG PO TABS: 200.0000 ug | ORAL_TABLET | Freq: Every day | ORAL | 0 refills | Status: DC

## 2021-02-15 ENCOUNTER — Telehealth: Payer: Self-pay | Admitting: Internal Medicine

## 2021-02-15 NOTE — Telephone Encounter (Signed)
Pt is calling in needing to have a refill on Rx's amphetamine-dextroamphetamine (ADDERALL) 20 MG and Levothyroxine (EUTHYROX) 200 MCG   Pharm:  [facility] on [address]

## 2021-02-19 NOTE — Telephone Encounter (Signed)
Spoke with patient and an appointment scheduled 

## 2021-02-20 ENCOUNTER — Telehealth (INDEPENDENT_AMBULATORY_CARE_PROVIDER_SITE_OTHER): Payer: 59 | Admitting: Internal Medicine

## 2021-02-20 ENCOUNTER — Encounter: Payer: Self-pay | Admitting: Internal Medicine

## 2021-02-20 ENCOUNTER — Other Ambulatory Visit: Payer: Self-pay

## 2021-02-20 VITALS — Wt 272.2 lb

## 2021-02-20 DIAGNOSIS — Z0000 Encounter for general adult medical examination without abnormal findings: Secondary | ICD-10-CM | POA: Diagnosis not present

## 2021-02-20 DIAGNOSIS — F900 Attention-deficit hyperactivity disorder, predominantly inattentive type: Secondary | ICD-10-CM

## 2021-02-20 DIAGNOSIS — Z1231 Encounter for screening mammogram for malignant neoplasm of breast: Secondary | ICD-10-CM | POA: Diagnosis not present

## 2021-02-20 MED ORDER — AMPHETAMINE-DEXTROAMPHETAMINE 20 MG PO TABS: 20.0000 mg | ORAL_TABLET | Freq: Every day | ORAL | 0 refills | Status: DC

## 2021-02-20 MED ORDER — LEVOTHYROXINE SODIUM 200 MCG PO TABS: 200.0000 ug | ORAL_TABLET | Freq: Every day | ORAL | 1 refills | Status: DC

## 2021-02-20 NOTE — Progress Notes (Signed)
Virtual Visit via Telephone Note  I connected with Madison Tapia on 02/20/21 at  9:30 AM EDT by telephone and verified that I am speaking with the correct person using two identifiers.   I discussed the limitations, risks, security and privacy concerns of performing an evaluation and management service by telephone and the availability of in person appointments. I also discussed with the patient that there may be a patient responsible charge related to this service. The patient expressed understanding and agreed to proceed.  Location patient: home Location provider: work office Participants present for the call: patient, provider Patient did not have a visit in the prior 7 days to address this/these issue(s).   History of Present Illness:  She has scheduled this visit for Adderall refills that she takes for ADHD per protocol.  She has been doing well on her current dosage which is 20 mg daily.  She is due for physical.  She is also requesting her mammogram.   Observations/Objective: Patient sounds cheerful and well on the phone. I do not appreciate any increased work of breathing. Speech and thought processing are grossly intact. Patient reported vitals: None reported   Current Outpatient Medications:  .  amphetamine-dextroamphetamine (ADDERALL) 20 MG tablet, Take 1 tablet (20 mg total) by mouth daily., Disp: 30 tablet, Rfl: 0 .  amphetamine-dextroamphetamine (ADDERALL) 20 MG tablet, Take 1 tablet (20 mg total) by mouth daily., Disp: 30 tablet, Rfl: 0 .  fluticasone (FLONASE) 50 MCG/ACT nasal spray, Place 2 sprays into both nostrils daily., Disp: 16 g, Rfl: 6 .  ibuprofen (ADVIL) 200 MG tablet, Take 400-800 mg by mouth every 6 (six) hours as needed for headache or moderate pain., Disp: , Rfl:  .  Multiple Vitamins-Minerals (MULTIVITAL PO), Take by mouth., Disp: , Rfl:  .  amphetamine-dextroamphetamine (ADDERALL) 20 MG tablet, Take 1 tablet (20 mg total) by mouth daily., Disp:  30 tablet, Rfl: 0 .  levothyroxine (EUTHYROX) 200 MCG tablet, Take 1 tablet (200 mcg total) by mouth daily before breakfast., Disp: 90 tablet, Rfl: 1  Review of Systems:  Constitutional: Denies fever, chills, diaphoresis, appetite change and fatigue.  HEENT: Denies photophobia, eye pain, redness, hearing loss, ear pain, congestion, sore throat, rhinorrhea, sneezing, mouth sores, trouble swallowing, neck pain, neck stiffness and tinnitus.   Respiratory: Denies SOB, DOE, cough, chest tightness,  and wheezing.   Cardiovascular: Denies chest pain, palpitations and leg swelling.  Gastrointestinal: Denies nausea, vomiting, abdominal pain, diarrhea, constipation, blood in stool and abdominal distention.  Genitourinary: Denies dysuria, urgency, frequency, hematuria, flank pain and difficulty urinating.  Endocrine: Denies: hot or cold intolerance, sweats, changes in hair or nails, polyuria, polydipsia. Musculoskeletal: Denies myalgias, back pain, joint swelling, arthralgias and gait problem.  Skin: Denies pallor, rash and wound.  Neurological: Denies dizziness, seizures, syncope, weakness, light-headedness, numbness and headaches.  Hematological: Denies adenopathy. Easy bruising, personal or family bleeding history  Psychiatric/Behavioral: Denies suicidal ideation, mood changes, confusion, nervousness, sleep disturbance and agitation   Assessment and Plan:  Encounter for screening mammogram for malignant neoplasm of breast  - Plan: MM Digital Screening  Attention deficit hyperactivity disorder (ADHD), predominantly inattentive type -PDMP reviewed, no red flags, overdose risk score 190. -Refill Adderall 20 mg to take 1 tablet daily for a total of 30 tablets a month x19-month.  Hypothyroidism -Levothyroxine refilled.    I discussed the assessment and treatment plan with the patient. The patient was provided an opportunity to ask questions and all were answered. The patient  agreed with the plan  and demonstrated an understanding of the instructions.   The patient was advised to call back or seek an in-person evaluation if the symptoms worsen or if the condition fails to improve as anticipated.  I provided 12 minutes of non-face-to-face time during this encounter.   Lelon Frohlich, MD [facility]

## 2021-05-27 ENCOUNTER — Other Ambulatory Visit: Payer: Self-pay | Admitting: Internal Medicine

## 2021-05-27 DIAGNOSIS — F900 Attention-deficit hyperactivity disorder, predominantly inattentive type: Secondary | ICD-10-CM

## 2021-05-27 NOTE — Telephone Encounter (Signed)
Pt call and stated she need a refill on amphetamine-dextroamphetamine (ADDERALL) 20 MG tablet  sent to  [place], [place] Phone:  [phone]  Fax:  [phone]

## 2021-05-28 MED ORDER — AMPHETAMINE-DEXTROAMPHETAMINE 20 MG PO TABS: 20.0000 mg | ORAL_TABLET | Freq: Every day | ORAL | 0 refills | Status: DC

## 2021-05-28 NOTE — Telephone Encounter (Signed)
Time for patient's CPE.

## 2021-05-28 NOTE — Telephone Encounter (Signed)
Patient has scheduled 07/05/21. Okay to send in 2 month until CPE?

## 2021-06-04 ENCOUNTER — Ambulatory Visit
Admission: RE | Admit: 2021-06-04 | Discharge: 2021-06-04 | Disposition: A | Discharge status: Home / Self Care | Payer: 59 | Source: Ambulatory Visit | Attending: Internal Medicine | Admitting: Internal Medicine

## 2021-06-04 ENCOUNTER — Other Ambulatory Visit: Payer: Self-pay

## 2021-06-04 DIAGNOSIS — Z1231 Encounter for screening mammogram for malignant neoplasm of breast: Secondary | ICD-10-CM

## 2021-07-04 ENCOUNTER — Other Ambulatory Visit: Payer: Self-pay

## 2021-07-05 ENCOUNTER — Ambulatory Visit (INDEPENDENT_AMBULATORY_CARE_PROVIDER_SITE_OTHER): Payer: 59 | Admitting: Internal Medicine

## 2021-07-05 ENCOUNTER — Encounter: Payer: Self-pay | Admitting: Internal Medicine

## 2021-07-05 VITALS — BP 124/84 | HR 101 | Temp 98.8°F | Ht 66.0 in | Wt 273.5 lb

## 2021-07-05 DIAGNOSIS — E559 Vitamin D deficiency, unspecified: Secondary | ICD-10-CM

## 2021-07-05 DIAGNOSIS — Z0000 Encounter for general adult medical examination without abnormal findings: Secondary | ICD-10-CM | POA: Diagnosis not present

## 2021-07-05 DIAGNOSIS — E039 Hypothyroidism, unspecified: Secondary | ICD-10-CM

## 2021-07-05 DIAGNOSIS — Z23 Encounter for immunization: Secondary | ICD-10-CM

## 2021-07-05 DIAGNOSIS — E538 Deficiency of other specified B group vitamins: Secondary | ICD-10-CM

## 2021-07-05 DIAGNOSIS — F900 Attention-deficit hyperactivity disorder, predominantly inattentive type: Secondary | ICD-10-CM

## 2021-07-05 DIAGNOSIS — E7800 Pure hypercholesterolemia, unspecified: Secondary | ICD-10-CM

## 2021-07-05 LAB — LIPID PANEL
Cholesterol: 206 mg/dL — ABNORMAL HIGH (ref 0–200)
HDL: 61.4 mg/dL (ref >39.00)
LDL Cholesterol: 113 mg/dL — ABNORMAL HIGH (ref 0–99)
NonHDL: 145.05
Total CHOL/HDL Ratio: 3
Triglycerides: 162 mg/dL — ABNORMAL HIGH (ref 0.0–149.0)
VLDL: 32.4 mg/dL (ref 0.0–40.0)

## 2021-07-05 LAB — COMPREHENSIVE METABOLIC PANEL
ALT: 14 U/L (ref 0–35)
AST: 19 U/L (ref 0–37)
Albumin: 4 g/dL (ref 3.5–5.2)
Alkaline Phosphatase: 60 U/L (ref 39–117)
BUN: 14 mg/dL (ref 6–23)
CO2: 25 mEq/L (ref 19–32)
Calcium: 9.4 mg/dL (ref 8.4–10.5)
Chloride: 103 mEq/L (ref 96–112)
Creatinine, Ser: 0.8 mg/dL (ref 0.40–1.20)
GFR: 86.22 mL/min (ref >60.00)
Glucose, Bld: 92 mg/dL (ref 70–99)
Potassium: 4.3 mEq/L (ref 3.5–5.1)
Sodium: 137 mEq/L (ref 135–145)
Total Bilirubin: 1 mg/dL (ref 0.2–1.2)
Total Protein: 7.4 g/dL (ref 6.0–8.3)

## 2021-07-05 LAB — TSH: TSH: 0.25 u[IU]/mL — ABNORMAL LOW (ref 0.35–5.50)

## 2021-07-05 LAB — CBC WITH DIFFERENTIAL/PLATELET
Basophils Absolute: 0.1 10*3/uL (ref 0.0–0.1)
Basophils Relative: 0.8 % (ref 0.0–3.0)
Eosinophils Absolute: 0.1 10*3/uL (ref 0.0–0.7)
Eosinophils Relative: 1.2 % (ref 0.0–5.0)
HCT: 44 % (ref 36.0–46.0)
Hemoglobin: 14.4 g/dL (ref 12.0–15.0)
Lymphocytes Relative: 19.8 % (ref 12.0–46.0)
Lymphs Abs: 1.3 10*3/uL (ref 0.7–4.0)
MCHC: 32.8 g/dL (ref 30.0–36.0)
MCV: 81.6 fl (ref 78.0–100.0)
Monocytes Absolute: 0.5 10*3/uL (ref 0.1–1.0)
Monocytes Relative: 7.2 % (ref 3.0–12.0)
Neutro Abs: 4.7 10*3/uL (ref 1.4–7.7)
Neutrophils Relative %: 71 % (ref 43.0–77.0)
Platelets: 230 10*3/uL (ref 150.0–400.0)
RBC: 5.39 Mil/uL — ABNORMAL HIGH (ref 3.87–5.11)
RDW: 15.3 % (ref 11.5–15.5)
WBC: 6.7 10*3/uL (ref 4.0–10.5)

## 2021-07-05 LAB — HEMOGLOBIN A1C: Hgb A1c MFr Bld: 5.5 % (ref 4.6–6.5)

## 2021-07-05 LAB — VITAMIN D 25 HYDROXY (VIT D DEFICIENCY, FRACTURES): VITD: 18.65 ng/mL — ABNORMAL LOW (ref 30.00–100.00)

## 2021-07-05 LAB — VITAMIN B12: Vitamin B-12: 142 pg/mL — ABNORMAL LOW (ref 211–911)

## 2021-07-05 MED ORDER — AMPHETAMINE-DEXTROAMPHETAMINE 20 MG PO TABS: 20.0000 mg | ORAL_TABLET | Freq: Every day | ORAL | 0 refills | Status: DC

## 2021-07-05 NOTE — Addendum Note (Signed)
Addended by: Azzie Almas on: 07/05/2021 09:44 AM   Modules accepted: Orders

## 2021-07-05 NOTE — Progress Notes (Signed)
Established Patient Office Visit     This visit occurred during the SARS-CoV-2 public health emergency.  Safety protocols were in place, including screening questions prior to the visit, additional usage of staff PPE, and extensive cleaning of exam room while observing appropriate contact time as indicated for disinfecting solutions.    CC/Reason for Visit: Annual preventive exam  HPI: Madison Tapia is a 50 y.o. female who is coming in today for the above mentioned reasons. Past Medical History is significant for: ADHD on Adderall, hypothyroidism, vitamin D and B12 deficiency, obstructive sleep apnea.  She has routine dental care but is overdue for an eye exam.  She had a colonoscopy in 2022, she had a negative mammogram last month.  She will schedule appointment with her GYN soon.  She is due for flu and COVID booster.  She would like to talk about weight loss.   Past Medical/Surgical History: 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/14/2007   Anemia 2020   ANXIETY 06/24/2007   Cancer ([place])    topical skin melanoma   Chronic kidney disease    kidney stones   COMMON MIGRAINE 06/24/2007   DEPRESSION 06/24/2007   Family history of adverse reaction to anesthesia    family -extreme nausea, hallucinations (mother)   Headache(784.0) 10/08/2007   HX, URINARY INFECTION 06/24/2007   IRRITABLE BOWEL SYNDROME, HX OF 06/24/2007   MALAISE AND FATIGUE 06/24/2007   NEPHROLITHIASIS, HX OF 06/24/2007   Sleep apnea    no cpap   Unspecified hypothyroidism 07/14/2007    Past Surgical History:  Procedure Laterality Date   KIDNEY STONE SURGERY     basket extraction-age 49   melanoma surgery  2018   from back   ROBOTIC ASSISTED LAPAROSCOPIC HYSTERECTOMY AND SALPINGECTOMY Bilateral 05/24/2019   Procedure: ATTEMPTED XI ROBOTIC ASSISTED TOTAL LAPAROSCOPIC HYSTERECTOMY AND SALPINGECTOMY, CONVERTED TO OPEN  PROEDURE: TOTAL  ABDOMINAL HYSTERECTOMY WITH BILATERAL SALPINGECTOMY;  Surgeon:  Princess Bruins, MD;  Location: [facility];  Service: Gynecology;  Laterality: Bilateral;  request 7:30am OR time in [place] block requests two hours Will contact [place] re: RNFA t    Social History:  reports that she has never smoked. She has never used smokeless tobacco. She reports current alcohol use. She reports that she does not use drugs.  Allergies: Allergies  Allergen Reactions   Sulfa Antibiotics Hives   Sulfonamide Derivatives Hives    Family History:  Family History  Problem Relation Age of Onset   Thyroid disease Mother    Breast cancer Mother 36   Diabetes Father    Hypertension Father    Mental illness Father    Depression Neg Hx        family hx   Colon cancer Neg Hx    Esophageal cancer Neg Hx    Rectal cancer Neg Hx    Stomach cancer Neg Hx      Current Outpatient Medications:    fluticasone (FLONASE) 50 MCG/ACT nasal spray, Place 2 sprays into both nostrils daily., Disp: 16 g, Rfl: 6   ibuprofen (ADVIL) 200 MG tablet, Take 400-800 mg by mouth every 6 (six) hours as needed for headache or moderate pain., Disp: , Rfl:    levothyroxine (EUTHYROX) 200 MCG tablet, Take 1 tablet (200 mcg total) by mouth daily before breakfast., Disp: 90 tablet, Rfl: 1   Multiple Vitamins-Minerals (MULTIVITAL PO), Take by mouth., Disp: , Rfl:    amphetamine-dextroamphetamine (ADDERALL) 20 MG tablet, Take  1 tablet (20 mg total) by mouth daily., Disp: 30 tablet, Rfl: 0   amphetamine-dextroamphetamine (ADDERALL) 20 MG tablet, Take 1 tablet (20 mg total) by mouth daily., Disp: 30 tablet, Rfl: 0   amphetamine-dextroamphetamine (ADDERALL) 20 MG tablet, Take 1 tablet (20 mg total) by mouth daily., Disp: 30 tablet, Rfl: 0  Review of Systems:  Constitutional: Denies fever, chills, diaphoresis, appetite change and fatigue.  HEENT: Denies photophobia, eye pain, redness, hearing loss, ear pain, congestion, sore throat, rhinorrhea, sneezing, mouth sores, trouble  swallowing, neck pain, neck stiffness and tinnitus.   Respiratory: Denies SOB, DOE, cough, chest tightness,  and wheezing.   Cardiovascular: Denies chest pain, palpitations and leg swelling.  Gastrointestinal: Denies nausea, vomiting, abdominal pain, diarrhea, constipation, blood in stool and abdominal distention.  Genitourinary: Denies dysuria, urgency, frequency, hematuria, flank pain and difficulty urinating.  Endocrine: Denies: hot or cold intolerance, sweats, changes in hair or nails, polyuria, polydipsia. Musculoskeletal: Denies myalgias, back pain, joint swelling, arthralgias and gait problem.  Skin: Denies pallor, rash and wound.  Neurological: Denies dizziness, seizures, syncope, weakness, light-headedness, numbness and headaches.  Hematological: Denies adenopathy. Easy bruising, personal or family bleeding history  Psychiatric/Behavioral: Denies suicidal ideation, mood changes, confusion, nervousness, sleep disturbance and agitation    Physical Exam: Vitals:   07/05/21 0850  BP: 124/84  Pulse: (!) 101  Temp: 98.8 F (37.1 C)  TempSrc: Oral  SpO2: 99%  Weight: 273 lb 8 oz (124.1 kg)  Height: 5\' 6"  (1.676 m)    Body mass index is 44.14 kg/m.   Constitutional: NAD, calm, comfortable, obese Eyes: PERRL, lids and conjunctivae normal ENMT: Mucous membranes are moist. Posterior pharynx clear of any exudate or lesions. Normal dentition. Tympanic membrane is pearly white, no erythema or bulging. Neck: normal, supple, no masses, no thyromegaly Respiratory: clear to auscultation bilaterally, no wheezing, no crackles. Normal respiratory effort. No accessory muscle use.  Cardiovascular: Regular rate and rhythm, no murmurs / rubs / gallops. No extremity edema. 2+ pedal pulses. No carotid bruits.  Abdomen: no tenderness, no masses palpated. No hepatosplenomegaly. Bowel sounds positive.  Musculoskeletal: no clubbing / cyanosis. No joint deformity upper and lower extremities. Good ROM,  no contractures. Normal muscle tone.  Skin: no rashes, lesions, ulcers. No induration Neurologic: CN 2-12 grossly intact. Sensation intact, DTR normal. Strength 5/5 in all 4.  Psychiatric: Normal judgment and insight. Alert and oriented x 3. Normal mood.    Impression and Plan:  Encounter for preventive health examination -Advised routine eye and dental care. -Flu vaccine in office today. -Advised to get COVID booster. -Labs will be updated today. -Healthy lifestyle discussed in detail. -She had a colonoscopy in 2022 and is a 5-year callback. -She had a negative mammogram in September 2022. -She will schedule follow-up with GYN for cervical cancer screening.  Attention deficit hyperactivity disorder (ADHD), predominantly inattentive type  -PDMP reviewed, no red flags, overdose risk score 0. -Refill Adderall 20 mg to take 1 tablets daily for total of 30 tablets a month x3 months.  Vitamin B12 deficiency  - Plan: Vitamin B12  Morbid obesity ([place])  - Plan: Hemoglobin A1c -Referral for weight and wellness.  Acquired hypothyroidism -  -Check TSH.  Vitamin D deficiency  - Plan: TSH, VITAMIN D 25 Hydroxy (Vit-D Deficiency, Fractures)  Need for influenza vaccination -Flu vaccine administered today.    Patient Instructions  -Nice seeing you today!!  -Lab work today; will notify you once results are available.  -Flu vaccine today.  -Remember your  COVID booster.  -Schedule follow up in 3 months.      Lelon Frohlich, MD [facility]

## 2021-07-05 NOTE — Addendum Note (Signed)
Addended by: Westley Hummer B on: 07/05/2021 12:03 PM   Modules accepted: Orders

## 2021-07-05 NOTE — Addendum Note (Signed)
Addended by: Westley Hummer B on: 07/05/2021 09:52 AM   Modules accepted: Orders

## 2021-07-05 NOTE — Patient Instructions (Signed)
-  Nice seeing you today!!  -Lab work today; will notify you once results are available.  -Flu vaccine today.  -Remember your COVID booster.  -Schedule follow up in 3 months.

## 2021-07-09 ENCOUNTER — Other Ambulatory Visit: Payer: Self-pay | Admitting: Internal Medicine

## 2021-07-09 ENCOUNTER — Encounter: Payer: Self-pay | Admitting: Internal Medicine

## 2021-07-09 DIAGNOSIS — E782 Mixed hyperlipidemia: Secondary | ICD-10-CM

## 2021-07-09 DIAGNOSIS — E785 Hyperlipidemia, unspecified: Secondary | ICD-10-CM | POA: Insufficient documentation

## 2021-07-09 MED ORDER — ATORVASTATIN CALCIUM 10 MG PO TABS: 10.0000 mg | ORAL_TABLET | Freq: Every day | ORAL | 1 refills | Status: DC

## 2021-07-09 NOTE — Addendum Note (Signed)
Addended by: Nilda Riggs on: 07/09/2021 03:34 PM   Modules accepted: Orders

## 2021-07-10 ENCOUNTER — Other Ambulatory Visit: Payer: Self-pay | Admitting: Internal Medicine

## 2021-07-10 DIAGNOSIS — E559 Vitamin D deficiency, unspecified: Secondary | ICD-10-CM

## 2021-07-11 MED ORDER — VITAMIN D (ERGOCALCIFEROL) 1.25 MG (50000 UNIT) PO CAPS: 50000.0000 [IU] | ORAL_CAPSULE | ORAL | 0 refills | Status: DC

## 2021-07-11 NOTE — Addendum Note (Signed)
Addended by: Rosalee Kaufman L on: 07/11/2021 08:30 AM   Modules accepted: Orders

## 2021-07-15 ENCOUNTER — Telehealth: Payer: Self-pay

## 2021-07-15 NOTE — Telephone Encounter (Signed)
Patient called asking if Rx  amphetamine-dextroamphetamine (ADDERALL) 20 MG tablet can be sent to  [facility] [address] [place], [place] [address]  Pt also stated that they was a dose change  levothyroxine (EUTHYROX) 200 MCG tablet And needs that sent in as well to  [facility]

## 2021-07-16 NOTE — Telephone Encounter (Signed)
Okay to change to [facility]?

## 2021-07-17 MED ORDER — LEVOTHYROXINE SODIUM 88 MCG PO TABS: 88.0000 ug | ORAL_TABLET | Freq: Every day | ORAL | 1 refills | Status: DC

## 2021-07-17 MED ORDER — LEVOTHYROXINE SODIUM 100 MCG PO TABS: 100.0000 ug | ORAL_TABLET | Freq: Every day | ORAL | 1 refills | Status: DC

## 2021-07-17 NOTE — Addendum Note (Signed)
Addended by: Westley Hummer B on: 07/17/2021 02:31 PM   Modules accepted: Orders

## 2021-07-17 NOTE — Telephone Encounter (Signed)
Patient would like a new Rx for levothyroxine 162mcg.  She will also wait to see if [facility] gets Adderall and call back. Rx sent

## 2021-07-18 ENCOUNTER — Other Ambulatory Visit: Payer: Self-pay

## 2021-07-18 ENCOUNTER — Other Ambulatory Visit: Payer: Self-pay | Admitting: Internal Medicine

## 2021-07-18 ENCOUNTER — Ambulatory Visit (INDEPENDENT_AMBULATORY_CARE_PROVIDER_SITE_OTHER): Payer: 59

## 2021-07-18 DIAGNOSIS — E039 Hypothyroidism, unspecified: Secondary | ICD-10-CM

## 2021-07-18 DIAGNOSIS — E538 Deficiency of other specified B group vitamins: Secondary | ICD-10-CM | POA: Diagnosis not present

## 2021-07-18 MED ORDER — CYANOCOBALAMIN 1000 MCG/ML IJ SOLN
1000.0000 ug | INTRAMUSCULAR | Status: AC
  Administered 2021-07-18 – 2021-07-25 (×2): 1000 ug via INTRAMUSCULAR

## 2021-07-18 NOTE — Progress Notes (Signed)
Per orders of Dr. Hernandez, injection of B12 given by Flor Houdeshell. Patient tolerated injection well.  

## 2021-07-25 ENCOUNTER — Ambulatory Visit (INDEPENDENT_AMBULATORY_CARE_PROVIDER_SITE_OTHER): Payer: 59

## 2021-07-25 ENCOUNTER — Other Ambulatory Visit: Payer: Self-pay

## 2021-07-25 DIAGNOSIS — E538 Deficiency of other specified B group vitamins: Secondary | ICD-10-CM

## 2021-07-25 MED ORDER — CYANOCOBALAMIN 1000 MCG/ML IJ SOLN: 1000.0000 ug | Freq: Once | INTRAMUSCULAR | Status: DC

## 2021-07-25 NOTE — Progress Notes (Signed)
Per orders of Dr. Hernandez, injection of Cyanocobalamin 1000 mcg given by Cecilee Rosner L Demetrios Byron. Patient tolerated injection well.  

## 2021-08-01 ENCOUNTER — Ambulatory Visit (INDEPENDENT_AMBULATORY_CARE_PROVIDER_SITE_OTHER): Payer: 59

## 2021-08-01 ENCOUNTER — Other Ambulatory Visit: Payer: Self-pay

## 2021-08-01 DIAGNOSIS — E538 Deficiency of other specified B group vitamins: Secondary | ICD-10-CM | POA: Diagnosis not present

## 2021-08-01 MED ORDER — CYANOCOBALAMIN 1000 MCG/ML IJ SOLN
1000.0000 ug | Freq: Once | INTRAMUSCULAR | Status: AC
  Administered 2021-08-01: 1000 ug via INTRAMUSCULAR

## 2021-08-01 NOTE — Progress Notes (Signed)
Per orders of Dr. Hernandez, injection of B12 given by Ravi Tuccillo. Patient tolerated injection well.  

## 2021-08-14 ENCOUNTER — Ambulatory Visit: Payer: 59

## 2021-08-16 ENCOUNTER — Ambulatory Visit (INDEPENDENT_AMBULATORY_CARE_PROVIDER_SITE_OTHER): Payer: 59

## 2021-08-16 DIAGNOSIS — E538 Deficiency of other specified B group vitamins: Secondary | ICD-10-CM

## 2021-08-16 MED ORDER — CYANOCOBALAMIN 1000 MCG/ML IJ SOLN
1000.0000 ug | Freq: Once | INTRAMUSCULAR | Status: AC
  Administered 2021-08-16: 1000 ug via INTRAMUSCULAR

## 2021-08-16 NOTE — Progress Notes (Signed)
Per orders of Dr. Hernandez, injection of Cyanocobalamin 1000 mcg given by Ysenia Filice L Kynsleigh [place]. Patient tolerated injection well.  

## 2021-11-11 ENCOUNTER — Encounter: Payer: Self-pay | Admitting: Internal Medicine

## 2021-11-11 ENCOUNTER — Other Ambulatory Visit: Payer: Self-pay | Admitting: Internal Medicine

## 2021-11-11 ENCOUNTER — Ambulatory Visit (INDEPENDENT_AMBULATORY_CARE_PROVIDER_SITE_OTHER): Payer: 59 | Admitting: Internal Medicine

## 2021-11-11 ENCOUNTER — Telehealth: Payer: Self-pay | Admitting: Internal Medicine

## 2021-11-11 VITALS — BP 130/88 | HR 86 | Temp 98.2°F | Ht 66.0 in | Wt 289.5 lb

## 2021-11-11 DIAGNOSIS — F339 Major depressive disorder, recurrent, unspecified: Secondary | ICD-10-CM

## 2021-11-11 DIAGNOSIS — E538 Deficiency of other specified B group vitamins: Secondary | ICD-10-CM

## 2021-11-11 DIAGNOSIS — F900 Attention-deficit hyperactivity disorder, predominantly inattentive type: Secondary | ICD-10-CM

## 2021-11-11 DIAGNOSIS — E782 Mixed hyperlipidemia: Secondary | ICD-10-CM | POA: Diagnosis not present

## 2021-11-11 DIAGNOSIS — E039 Hypothyroidism, unspecified: Secondary | ICD-10-CM | POA: Diagnosis not present

## 2021-11-11 DIAGNOSIS — Z9889 Other specified postprocedural states: Secondary | ICD-10-CM | POA: Diagnosis not present

## 2021-11-11 DIAGNOSIS — E559 Vitamin D deficiency, unspecified: Secondary | ICD-10-CM

## 2021-11-11 MED ORDER — AMPHETAMINE-DEXTROAMPHETAMINE 20 MG PO TABS: 20.0000 mg | ORAL_TABLET | Freq: Every day | ORAL | 0 refills | Status: DC

## 2021-11-11 MED ORDER — CYANOCOBALAMIN 1000 MCG/ML IJ SOLN
1000.0000 ug | Freq: Once | INTRAMUSCULAR | Status: AC
  Administered 2021-11-11: 1000 ug via INTRAMUSCULAR

## 2021-11-11 NOTE — Progress Notes (Signed)
Established Patient Office Visit     This visit occurred during the SARS-CoV-2 public health emergency.  Safety protocols were in place, including screening questions prior to the visit, additional usage of staff PPE, and extensive cleaning of exam room while observing appropriate contact time as indicated for disinfecting solutions.    CC/Reason for Visit: Follow-up chronic conditions  HPI: Madison Tapia is a 51 y.o. female who is coming in today for the above mentioned reasons. Past Medical History is significant for: ADHD on Adderall, hypothyroidism, obstructive sleep apnea, vitamin D and B12 deficiency.  She also has a history of depression but has not been on medication for years.  She has been seeing a therapist who recommended that she talk to me about her depressed mood.  Of note on her lab work back in October for physical she was found to have an over suppressed TSH and significant B12 and vitamin D deficiencies.  She has also been off of her Adderall for over 3 months due to medication shortages.   Past Medical/Surgical History: 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/14/2007   Anemia 2020   ANXIETY 06/24/2007   Cancer ([place])    topical skin melanoma   Chronic kidney disease    kidney stones   COMMON MIGRAINE 06/24/2007   DEPRESSION 06/24/2007   Family history of adverse reaction to anesthesia    family -extreme nausea, hallucinations (mother)   Headache(784.0) 10/08/2007   HX, URINARY INFECTION 06/24/2007   IRRITABLE BOWEL SYNDROME, HX OF 06/24/2007   MALAISE AND FATIGUE 06/24/2007   NEPHROLITHIASIS, HX OF 06/24/2007   Sleep apnea    no cpap   Unspecified hypothyroidism 07/14/2007    Past Surgical History:  Procedure Laterality Date   KIDNEY STONE SURGERY     basket extraction-age 32   melanoma surgery  2018   from back   ROBOTIC ASSISTED LAPAROSCOPIC HYSTERECTOMY AND SALPINGECTOMY Bilateral 05/24/2019   Procedure: ATTEMPTED XI ROBOTIC ASSISTED TOTAL  LAPAROSCOPIC HYSTERECTOMY AND SALPINGECTOMY, CONVERTED TO OPEN  PROEDURE: TOTAL  ABDOMINAL HYSTERECTOMY WITH BILATERAL SALPINGECTOMY;  Surgeon: Princess Bruins, MD;  Location: [facility];  Service: Gynecology;  Laterality: Bilateral;  request 7:30am OR time in [place] block requests two hours Will contact [place] re: RNFA t    Social History:  reports that she has never smoked. She has never used smokeless tobacco. She reports current alcohol use. She reports that she does not use drugs.  Allergies: Allergies  Allergen Reactions   Sulfa Antibiotics Hives   Sulfonamide Derivatives Hives    Family History:  Family History  Problem Relation Age of Onset   Thyroid disease Mother    Breast cancer Mother 39   Diabetes Father    Hypertension Father    Mental illness Father    Depression Neg Hx        family hx   Colon cancer Neg Hx    Esophageal cancer Neg Hx    Rectal cancer Neg Hx    Stomach cancer Neg Hx      Current Outpatient Medications:    amphetamine-dextroamphetamine (ADDERALL) 20 MG tablet, Take 1 tablet (20 mg total) by mouth daily., Disp: 30 tablet, Rfl: 0   amphetamine-dextroamphetamine (ADDERALL) 20 MG tablet, Take 1 tablet (20 mg total) by mouth daily., Disp: 30 tablet, Rfl: 0   amphetamine-dextroamphetamine (ADDERALL) 20 MG tablet, Take 1 tablet (20 mg total) by mouth daily., Disp: 30 tablet, Rfl: 0   atorvastatin (  LIPITOR) 10 MG tablet, Take 1 tablet (10 mg total) by mouth daily., Disp: 90 tablet, Rfl: 1   fluticasone (FLONASE) 50 MCG/ACT nasal spray, Place 2 sprays into both nostrils daily., Disp: 16 g, Rfl: 6   ibuprofen (ADVIL) 200 MG tablet, Take 400-800 mg by mouth every 6 (six) hours as needed for headache or moderate pain., Disp: , Rfl:    levothyroxine (SYNTHROID) 100 MCG tablet, Take 1 tablet (100 mcg total) by mouth daily., Disp: 90 tablet, Rfl: 1   levothyroxine (SYNTHROID) 88 MCG tablet, Take 1 tablet (88 mcg total) by mouth  daily., Disp: 90 tablet, Rfl: 1   Multiple Vitamins-Minerals (MULTIVITAL PO), Take by mouth., Disp: , Rfl:   Review of Systems:  Constitutional: Denies fever, chills, diaphoresis, appetite change and fatigue.  HEENT: Denies photophobia, eye pain, redness, hearing loss, ear pain, congestion, sore throat, rhinorrhea, sneezing, mouth sores, trouble swallowing, neck pain, neck stiffness and tinnitus.   Respiratory: Denies SOB, DOE, cough, chest tightness,  and wheezing.   Cardiovascular: Denies chest pain, palpitations and leg swelling.  Gastrointestinal: Denies nausea, vomiting, abdominal pain, diarrhea, constipation, blood in stool and abdominal distention.  Genitourinary: Denies dysuria, urgency, frequency, hematuria, flank pain and difficulty urinating.  Endocrine: Denies: hot or cold intolerance, sweats, changes in hair or nails, polyuria, polydipsia. Musculoskeletal: Denies myalgias, back pain, joint swelling, arthralgias and gait problem.  Skin: Denies pallor, rash and wound.  Neurological: Denies dizziness, seizures, syncope, weakness, light-headedness, numbness and headaches.  Hematological: Denies adenopathy. Easy bruising, personal or family bleeding history  Psychiatric/Behavioral: Denies suicidal ideation,   and agitation    Physical Exam: Vitals:   11/11/21 1506  BP: 130/88  Pulse: 86  Temp: 98.2 F (36.8 C)  TempSrc: Oral  SpO2: 96%  Weight: 289 lb 8 oz (131.3 kg)  Height: 5\' 6"  (1.676 m)    Body mass index is 46.73 kg/m.   Constitutional: NAD, calm, comfortable, obese Eyes: PERRL, lids and conjunctivae normal ENMT: Mucous membranes are moist.  Respiratory: clear to auscultation bilaterally, no wheezing, no crackles. Normal respiratory effort. No accessory muscle use.  Cardiovascular: Regular rate and rhythm, no murmurs / rubs / gallops. No extremity edema.  Neurologic: Grossly intact and nonfocal Psychiatric: Normal judgment and insight. Alert and oriented x 3.  Normal mood.    Impression and Plan:  Acquired hypothyroidism  - Plan: TSH  Vitamin D deficiency  - Plan: VITAMIN D 25 Hydroxy (Vit-D Deficiency, Fractures)  Depression, recurrent ([place]) -She has had a high PHQ-9 score of 21. -However I think there are many different factors that could be playing a role in her depressed mood including lack of Adderall, vitamin B 12 and D deficiencies as well as over suppressed TSH.  Check labs, restart Adderall and follow-up in 8 to 12 weeks, if no improvement will consider starting antidepressant.  Mixed hyperlipidemia  - Plan: Lipid panel, LDL Cholesterol, Direct -She was started on atorvastatin 10 mg after her last physical in October.  Attention deficit hyperactivity disorder (ADHD), predominantly inattentive type -PDMP reviewed, no red flags, overdose risk score is 0. -She is prescribed Adderall 20 mg to take 1 tablet daily.  She will call around different pharmacies in town and will notify me where she would like a prescription sent.  Vitamin B12 deficiency  - Plan: Vitamin B12 -B12 injection today,  Time spent: 32 minutes reviewing chart, interviewing and examining patient and formulating plan of care.   Patient Instructions  -Nice seeing you today!!  -Lab  work today; will notify you once results are available.  -Schedule follow up in 8-12 weeks.     Lelon Frohlich, MD [facility] at [facility]

## 2021-11-11 NOTE — Telephone Encounter (Signed)
Pt is calling and [facility] [address] does not have amphetamine-dextroamphetamine (ADDERALL) 20 MG tablet please send to  [place], [place] Phone:  [phone]  Fax:  [phone]

## 2021-11-11 NOTE — Patient Instructions (Signed)
-  Nice seeing you today!!  -Lab work today; will notify you once results are available.  -Schedule follow up in 8-12 weeks.

## 2021-11-11 NOTE — Telephone Encounter (Signed)
Patient informed of the message below.

## 2021-11-11 NOTE — Addendum Note (Signed)
Addended by: Agnes Lawrence on: 11/11/2021 03:54 PM   Modules accepted: Orders

## 2021-11-12 LAB — TSH: TSH: 1.97 u[IU]/mL (ref 0.35–5.50)

## 2021-11-12 LAB — VITAMIN D 25 HYDROXY (VIT D DEFICIENCY, FRACTURES): VITD: 17.19 ng/mL — ABNORMAL LOW (ref 30.00–100.00)

## 2021-11-12 LAB — VITAMIN B12: Vitamin B-12: >1504 pg/mL — ABNORMAL HIGH (ref 211–911)

## 2021-11-12 NOTE — Telephone Encounter (Signed)
Rx sent 11/11/21

## 2021-11-13 ENCOUNTER — Other Ambulatory Visit: Payer: Self-pay

## 2021-11-13 ENCOUNTER — Other Ambulatory Visit: Payer: Self-pay | Admitting: Internal Medicine

## 2021-11-13 DIAGNOSIS — E559 Vitamin D deficiency, unspecified: Secondary | ICD-10-CM

## 2021-11-13 LAB — LIPID PANEL
Cholesterol: 180 mg/dL (ref 0–200)
HDL: 62.4 mg/dL (ref >39.00)
NonHDL: 117.5
Total CHOL/HDL Ratio: 3
Triglycerides: 252 mg/dL — ABNORMAL HIGH (ref 0.0–149.0)
VLDL: 50.4 mg/dL — ABNORMAL HIGH (ref 0.0–40.0)

## 2021-11-13 LAB — LDL CHOLESTEROL, DIRECT: Direct LDL: 101 mg/dL

## 2021-11-13 MED ORDER — VITAMIN D (ERGOCALCIFEROL) 1.25 MG (50000 UNIT) PO CAPS: 50000.0000 [IU] | ORAL_CAPSULE | ORAL | 0 refills | Status: AC

## 2022-01-13 ENCOUNTER — Encounter: Payer: Self-pay | Admitting: Internal Medicine

## 2022-01-13 ENCOUNTER — Ambulatory Visit: Payer: 59 | Admitting: Internal Medicine

## 2022-01-13 ENCOUNTER — Telehealth: Payer: Self-pay | Admitting: Internal Medicine

## 2022-01-13 VITALS — BP 125/80 | HR 76 | Temp 98.7°F | Wt 279.2 lb

## 2022-01-13 DIAGNOSIS — E039 Hypothyroidism, unspecified: Secondary | ICD-10-CM | POA: Diagnosis not present

## 2022-01-13 DIAGNOSIS — E782 Mixed hyperlipidemia: Secondary | ICD-10-CM | POA: Diagnosis not present

## 2022-01-13 DIAGNOSIS — F339 Major depressive disorder, recurrent, unspecified: Secondary | ICD-10-CM

## 2022-01-13 DIAGNOSIS — F900 Attention-deficit hyperactivity disorder, predominantly inattentive type: Secondary | ICD-10-CM | POA: Diagnosis not present

## 2022-01-13 MED ORDER — AMPHETAMINE-DEXTROAMPHETAMINE 20 MG PO TABS: 20.0000 mg | ORAL_TABLET | Freq: Every day | ORAL | 0 refills | Status: DC

## 2022-01-13 MED ORDER — ESCITALOPRAM OXALATE 10 MG PO TABS: 10.0000 mg | ORAL_TABLET | Freq: Every day | ORAL | 1 refills | Status: DC

## 2022-01-13 NOTE — Patient Instructions (Signed)
-  Nice seeing you today!! ? ?-Start lexapro 10 mg at bedtime. ? ?-Schedule follow up in 3 months. ?

## 2022-01-13 NOTE — Telephone Encounter (Signed)
Pt calling to have escitalopram (LEXAPRO) 10 MG tablet   sent to   ?[place], [place] Phone:  [phone]  ?Fax:  [phone]  ?  ? ?

## 2022-01-13 NOTE — Progress Notes (Signed)
? ? ? ?Established Patient Office Visit ? ? ? ? ?This visit occurred during the SARS-CoV-2 public health emergency.  Safety protocols were in place, including screening questions prior to the visit, additional usage of staff PPE, and extensive cleaning of exam room while observing appropriate contact time as indicated for disinfecting solutions.  ? ? ?CC/Reason for Visit: Follow-up chronic medical conditions ? ?HPI: Madison Tapia is a 51 y.o. female who is coming in today for the above mentioned reasons. Past Medical History is significant for: Depression, obesity, ADHD, depression, anxiety, hypothyroidism, vitamin D and B12 deficiencies.  She continues to struggle with her mood.  She continues CBT.  She is requesting refills of Adderall today. ? ? ?Past Medical/Surgical History: ?Past Medical History:  ?Diagnosis Date  ? ADD 07/14/2007  ? Anemia 2020  ? ANXIETY 06/24/2007  ? Cancer [facility])   ? topical skin melanoma  ? Chronic kidney disease   ? kidney stones  ? COMMON MIGRAINE 06/24/2007  ? DEPRESSION 06/24/2007  ? Family history of adverse reaction to anesthesia   ? family -extreme nausea, hallucinations (mother)  ? Headache(784.0) 10/08/2007  ? HX, URINARY INFECTION 06/24/2007  ? IRRITABLE BOWEL SYNDROME, HX OF 06/24/2007  ? MALAISE AND FATIGUE 06/24/2007  ? NEPHROLITHIASIS, HX OF 06/24/2007  ? Sleep apnea   ? no cpap  ? Unspecified hypothyroidism 07/14/2007  ? ? ?Past Surgical History:  ?Procedure Laterality Date  ? KIDNEY STONE SURGERY    ? basket extraction-age 66  ? melanoma surgery  2018  ? from back  ? ROBOTIC ASSISTED LAPAROSCOPIC HYSTERECTOMY AND SALPINGECTOMY Bilateral 05/24/2019  ? Procedure: ATTEMPTED XI ROBOTIC ASSISTED TOTAL LAPAROSCOPIC HYSTERECTOMY AND SALPINGECTOMY, CONVERTED TO OPEN  PROEDURE: TOTAL  ABDOMINAL HYSTERECTOMY WITH BILATERAL SALPINGECTOMY;  Surgeon: Princess Bruins, MD;  Location: [facility];  Service: Gynecology;  Laterality: Bilateral;  request 7:30am OR time in  [place] block ?requests two hours ?Will contact [place] re: RNFA t  ? ? ?Social History: ? reports that she has never smoked. She has never used smokeless tobacco. She reports current alcohol use. She reports that she does not use drugs. ? ?Allergies: ?Allergies  ?Allergen Reactions  ? Sulfa Antibiotics Hives  ? Sulfonamide Derivatives Hives  ? ? ?Family History:  ?Family History  ?Problem Relation Age of Onset  ? Thyroid disease Mother   ? Breast cancer Mother 25  ? Diabetes Father   ? Hypertension Father   ? Mental illness Father   ? Depression Neg Hx   ?     family hx  ? Colon cancer Neg Hx   ? Esophageal cancer Neg Hx   ? Rectal cancer Neg Hx   ? Stomach cancer Neg Hx   ? ? ? ?Current Outpatient Medications:  ?  atorvastatin (LIPITOR) 10 MG tablet, Take 1 tablet (10 mg total) by mouth daily., Disp: 90 tablet, Rfl: 1 ?  fluticasone (FLONASE) 50 MCG/ACT nasal spray, Place 2 sprays into both nostrils daily., Disp: 16 g, Rfl: 6 ?  ibuprofen (ADVIL) 200 MG tablet, Take 400-800 mg by mouth every 6 (six) hours as needed for headache or moderate pain., Disp: , Rfl:  ?  levothyroxine (SYNTHROID) 100 MCG tablet, Take 1 tablet (100 mcg total) by mouth daily., Disp: 90 tablet, Rfl: 1 ?  levothyroxine (SYNTHROID) 88 MCG tablet, Take 1 tablet (88 mcg total) by mouth daily., Disp: 90 tablet, Rfl: 1 ?  Multiple Vitamins-Minerals (MULTIVITAL PO), Take by mouth., Disp: , Rfl:  ?  Vitamin D, Ergocalciferol, (DRISDOL) 1.25 MG (50000 UNIT) CAPS capsule, Take 1 capsule (50,000 Units total) by mouth every 7 (seven) days for 12 doses., Disp: 12 capsule, Rfl: 0 ?  amphetamine-dextroamphetamine (ADDERALL) 20 MG tablet, Take 1 tablet (20 mg total) by mouth daily., Disp: 30 tablet, Rfl: 0 ?  amphetamine-dextroamphetamine (ADDERALL) 20 MG tablet, Take 1 tablet (20 mg total) by mouth daily., Disp: 30 tablet, Rfl: 0 ?  amphetamine-dextroamphetamine (ADDERALL) 20 MG tablet, Take 1 tablet (20 mg total) by mouth daily., Disp: 30 tablet,  Rfl: 0 ?  escitalopram (LEXAPRO) 10 MG tablet, Take 1 tablet (10 mg total) by mouth daily., Disp: 90 tablet, Rfl: 1 ? ?Review of Systems:  ?Constitutional: Denies fever, chills, diaphoresis, appetite change and fatigue.  ?HEENT: Denies photophobia, eye pain, redness, hearing loss, ear pain, congestion, sore throat, rhinorrhea, sneezing, mouth sores, trouble swallowing, neck pain, neck stiffness and tinnitus.   ?Respiratory: Denies SOB, DOE, cough, chest tightness,  and wheezing.   ?Cardiovascular: Denies chest pain, palpitations and leg swelling.  ?Gastrointestinal: Denies nausea, vomiting, abdominal pain, diarrhea, constipation, blood in stool and abdominal distention.  ?Genitourinary: Denies dysuria, urgency, frequency, hematuria, flank pain and difficulty urinating.  ?Endocrine: Denies: hot or cold intolerance, sweats, changes in hair or nails, polyuria, polydipsia. ?Musculoskeletal: Denies myalgias, back pain, joint swelling, arthralgias and gait problem.  ?Skin: Denies pallor, rash and wound.  ?Neurological: Denies dizziness, seizures, syncope, weakness, light-headedness, numbness and headaches.  ?Hematological: Denies adenopathy. Easy bruising, personal or family bleeding history  ?Psychiatric/Behavioral: Denies suicidal ideation, , confusion, nervousness, sleep disturbance and agitation ? ? ? ?Physical Exam: ?Vitals:  ? 01/13/22 1458 01/13/22 1539  ?BP: 130/90 125/80  ?Pulse: 76   ?Temp: 98.7 ?F (37.1 ?C)   ?TempSrc: Oral   ?SpO2: 100%   ?Weight: 279 lb 3.2 oz (126.6 kg)   ? ? ?Body mass index is 45.06 kg/m?. ? ? ?Constitutional: NAD, calm, comfortable ?Eyes: PERRL, lids and conjunctivae normal ?ENMT: Mucous membranes are moist.  ?Respiratory: clear to auscultation bilaterally, no wheezing, no crackles. Normal respiratory effort. No accessory muscle use.  ?Cardiovascular: Regular rate and rhythm, no murmurs / rubs / gallops. No extremity edema.  ?Neurologic: Grossly intact and nonfocal ?Psychiatric: Normal  judgment and insight. Alert and oriented x 3.  Mood appears depressed, tearful ? ? ?Impression and Plan: ? ?Mixed hyperlipidemia ? ?Attention deficit hyperactivity disorder (ADHD), predominantly inattentive type  ?-PDMP reviewed, no red flags, overdose risk score is 0. ?-Refill Adderall 20 mg to take 1 tablet daily for total of 30 tablets a month x3 months. ? ?Depression, recurrent ([place]) ? - Plan: escitalopram (LEXAPRO) 10 MG tablet, DISCONTINUED: escitalopram (LEXAPRO) 10 MG tablet ?[facility] Office Visit from 01/13/2022 in [facility] [place]  ?PHQ-9 Total Score 24  ? ?  ? ?-Start Lexapro 10 mg daily, return in 12 weeks for follow-up. ? ?Acquired hypothyroidism ?-Last TSH within range at 1.970. ? ? ? ?Time spent:32 minutes reviewing chart, interviewing and examining patient and formulating plan of care. ? ? ?Patient Instructions  ?-Nice seeing you today!! ? ?-Start lexapro 10 mg at bedtime. ? ?-Schedule follow up in 3 months. ? ? ?Lelon Frohlich, MD ?[facility] ? ? ?

## 2022-01-14 MED ORDER — ESCITALOPRAM OXALATE 10 MG PO TABS: 10.0000 mg | ORAL_TABLET | Freq: Every day | ORAL | 1 refills | Status: DC

## 2022-01-14 NOTE — Telephone Encounter (Signed)
Rx sent 

## 2022-02-13 ENCOUNTER — Other Ambulatory Visit: Payer: Self-pay

## 2022-02-13 ENCOUNTER — Other Ambulatory Visit (INDEPENDENT_AMBULATORY_CARE_PROVIDER_SITE_OTHER): Payer: 59

## 2022-02-13 ENCOUNTER — Other Ambulatory Visit: Payer: Self-pay | Admitting: Internal Medicine

## 2022-02-13 DIAGNOSIS — E559 Vitamin D deficiency, unspecified: Secondary | ICD-10-CM

## 2022-02-13 LAB — VITAMIN D 25 HYDROXY (VIT D DEFICIENCY, FRACTURES): VITD: 23.45 ng/mL — ABNORMAL LOW (ref 30.00–100.00)

## 2022-02-13 MED ORDER — VITAMIN D (ERGOCALCIFEROL) 1.25 MG (50000 UNIT) PO CAPS: 50000.0000 [IU] | ORAL_CAPSULE | ORAL | 0 refills | Status: AC

## 2022-02-24 ENCOUNTER — Other Ambulatory Visit: Payer: Self-pay | Admitting: Internal Medicine

## 2022-03-07 ENCOUNTER — Other Ambulatory Visit: Payer: Self-pay | Admitting: Internal Medicine

## 2022-04-02 ENCOUNTER — Encounter: Payer: Self-pay | Admitting: Family Medicine

## 2022-04-02 ENCOUNTER — Telehealth (INDEPENDENT_AMBULATORY_CARE_PROVIDER_SITE_OTHER): Payer: 59 | Admitting: Family Medicine

## 2022-04-02 VITALS — Temp 100.6°F | Wt 279.0 lb

## 2022-04-02 DIAGNOSIS — U071 COVID-19: Secondary | ICD-10-CM | POA: Diagnosis not present

## 2022-04-02 MED ORDER — NIRMATRELVIR/RITONAVIR (PAXLOVID)TABLET: 3.0000 | ORAL_TABLET | Freq: Two times a day (BID) | ORAL | 0 refills | Status: AC

## 2022-04-02 NOTE — Progress Notes (Signed)
Patient ID: Madison Tapia, female   DOB: 07-09-1971, 51 y.o.   MRN: [id number]   Virtual Visit via Video Note  I connected with Madison Tapia on 04/02/22 at  3:00 PM EDT by a video enabled telemedicine application and verified that I am speaking with the correct person using two identifiers.  Location patient: home Location provider:work or home office Persons participating in the virtual visit: patient, provider  I discussed the limitations of evaluation and management by telemedicine and the availability of in person appointments. The patient expressed understanding and agreed to proceed.   HPI: Madison Tapia has COVID by home testing.  She was on a cruise last week and her sister and brother-in-law both came down with symptoms about the same time and all of them have tested positive.  She had some headache Friday but particularly noted symptoms by Saturday.  Low-grade fever, chills, sinus congestion.  Does have some dry cough.  No dyspnea.  No nausea or vomiting.  She would like to consider antivirals if possible.  She has history of obstructive sleep apnea and hypothyroidism.  No chronic lung disease.   ROS: See pertinent positives and negatives per HPI.  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/14/2007   Anemia 2020   ANXIETY 06/24/2007   Cancer ([place])    topical skin melanoma   Chronic kidney disease    kidney stones   COMMON MIGRAINE 06/24/2007   DEPRESSION 06/24/2007   Family history of adverse reaction to anesthesia    family -extreme nausea, hallucinations (mother)   Headache(784.0) 10/08/2007   HX, URINARY INFECTION 06/24/2007   IRRITABLE BOWEL SYNDROME, HX OF 06/24/2007   MALAISE AND FATIGUE 06/24/2007   NEPHROLITHIASIS, HX OF 06/24/2007   Sleep apnea    no cpap   Unspecified hypothyroidism 07/14/2007    Past Surgical History:  Procedure Laterality Date   KIDNEY STONE SURGERY     basket extraction-age 49   melanoma surgery  2018   from back   ROBOTIC  ASSISTED LAPAROSCOPIC HYSTERECTOMY AND SALPINGECTOMY Bilateral 05/24/2019   Procedure: ATTEMPTED XI ROBOTIC ASSISTED TOTAL LAPAROSCOPIC HYSTERECTOMY AND SALPINGECTOMY, CONVERTED TO OPEN  PROEDURE: TOTAL  ABDOMINAL HYSTERECTOMY WITH BILATERAL SALPINGECTOMY;  Surgeon: Princess Bruins, MD;  Location: [facility];  Service: Gynecology;  Laterality: Bilateral;  request 7:30am OR time in [place] block requests two hours Will contact [place] re: RNFA t    Family History  Problem Relation Age of Onset   Thyroid disease Mother    Breast cancer Mother 37   Diabetes Father    Hypertension Father    Mental illness Father    Depression Neg Hx        family hx   Colon cancer Neg Hx    Esophageal cancer Neg Hx    Rectal cancer Neg Hx    Stomach cancer Neg Hx     SOCIAL HX: Non-smoker   Current Outpatient Medications:    amphetamine-dextroamphetamine (ADDERALL) 20 MG tablet, Take 1 tablet (20 mg total) by mouth daily., Disp: 30 tablet, Rfl: 0   amphetamine-dextroamphetamine (ADDERALL) 20 MG tablet, Take 1 tablet (20 mg total) by mouth daily., Disp: 30 tablet, Rfl: 0   amphetamine-dextroamphetamine (ADDERALL) 20 MG tablet, Take 1 tablet (20 mg total) by mouth daily., Disp: 30 tablet, Rfl: 0   escitalopram (LEXAPRO) 10 MG tablet, Take 1 tablet (10 mg total) by mouth daily., Disp: 90 tablet, Rfl: 1   fluticasone (FLONASE) 50 MCG/ACT nasal spray, Place 2  sprays into both nostrils daily., Disp: 16 g, Rfl: 6   ibuprofen (ADVIL) 200 MG tablet, Take 400-800 mg by mouth every 6 (six) hours as needed for headache or moderate pain., Disp: , Rfl:    levothyroxine (SYNTHROID) 100 MCG tablet, Take 1 tablet by mouth once daily, Disp: 90 tablet, Rfl: 0   levothyroxine (SYNTHROID) 88 MCG tablet, Take 1 tablet by mouth once daily, Disp: 90 tablet, Rfl: 1   Multiple Vitamins-Minerals (MULTIVITAL PO), Take by mouth., Disp: , Rfl:    Vitamin D, Ergocalciferol, (DRISDOL) 1.25 MG (50000 UNIT)  CAPS capsule, Take 1 capsule (50,000 Units total) by mouth every 7 (seven) days for 12 doses., Disp: 12 capsule, Rfl: 0  EXAM:  VITALS per patient if applicable:  GENERAL: alert, oriented, appears well and in no acute distress  HEENT: atraumatic, conjunttiva clear, no obvious abnormalities on inspection of external nose and ears  NECK: normal movements of the head and neck  LUNGS: on inspection no signs of respiratory distress, breathing rate appears normal, no obvious gross SOB, gasping or wheezing  CV: no obvious cyanosis  MS: moves all visible extremities without noticeable abnormality  PSYCH/NEURO: pleasant and cooperative, no obvious depression or anxiety, speech and thought processing grossly intact  ASSESSMENT AND PLAN:  Discussed the following assessment and plan:   COVID infection.  Patient is nearing 5-day window for antivirals but is not quite there yet.  We did discuss consideration for Paxlovid twice daily for 5 days Plenty fluids and rest Follow-up for any persistent or worsening symptoms    I discussed the assessment and treatment plan with the patient. The patient was provided an opportunity to ask questions and all were answered. The patient agreed with the plan and demonstrated an understanding of the instructions.   The patient was advised to call back or seek an in-person evaluation if the symptoms worsen or if the condition fails to improve as anticipated.     Carolann Littler, MD

## 2022-05-08 ENCOUNTER — Other Ambulatory Visit: Payer: 59

## 2022-05-21 ENCOUNTER — Other Ambulatory Visit: Payer: Self-pay | Admitting: Internal Medicine

## 2022-05-21 DIAGNOSIS — F900 Attention-deficit hyperactivity disorder, predominantly inattentive type: Secondary | ICD-10-CM

## 2022-05-21 MED ORDER — AMPHETAMINE-DEXTROAMPHETAMINE 20 MG PO TABS: 20.0000 mg | ORAL_TABLET | Freq: Every day | ORAL | 0 refills | Status: DC

## 2022-05-21 NOTE — Telephone Encounter (Signed)
Refill sent.

## 2022-05-21 NOTE — Telephone Encounter (Signed)
Patient has scheduled a VV 05/27/22

## 2022-05-21 NOTE — Telephone Encounter (Signed)
Requesting refill amphetamine-dextroamphetamine (ADDERALL) 20 MG tablet   [id number] [place], [place] Phone:  [phone]  Fax:  [phone]

## 2022-05-22 ENCOUNTER — Other Ambulatory Visit: Payer: 59

## 2022-05-25 ENCOUNTER — Encounter (HOSPITAL_BASED_OUTPATIENT_CLINIC_OR_DEPARTMENT_OTHER): Payer: Self-pay | Admitting: Emergency Medicine

## 2022-05-25 ENCOUNTER — Emergency Department (HOSPITAL_BASED_OUTPATIENT_CLINIC_OR_DEPARTMENT_OTHER): Payer: 59

## 2022-05-25 ENCOUNTER — Observation Stay (HOSPITAL_BASED_OUTPATIENT_CLINIC_OR_DEPARTMENT_OTHER)
Admission: EM | Admit: 2022-05-25 | Discharge: 2022-05-26 | Disposition: A | Discharge status: Home / Self Care | Payer: 59 | Attending: Surgery | Admitting: Surgery

## 2022-05-25 ENCOUNTER — Other Ambulatory Visit: Payer: Self-pay

## 2022-05-25 DIAGNOSIS — K810 Acute cholecystitis: Secondary | ICD-10-CM | POA: Diagnosis present

## 2022-05-25 DIAGNOSIS — Z79899 Other long term (current) drug therapy: Secondary | ICD-10-CM | POA: Insufficient documentation

## 2022-05-25 DIAGNOSIS — E039 Hypothyroidism, unspecified: Secondary | ICD-10-CM | POA: Diagnosis not present

## 2022-05-25 DIAGNOSIS — K8012 Calculus of gallbladder with acute and chronic cholecystitis without obstruction: Secondary | ICD-10-CM | POA: Diagnosis not present

## 2022-05-25 DIAGNOSIS — K8020 Calculus of gallbladder without cholecystitis without obstruction: Principal | ICD-10-CM

## 2022-05-25 DIAGNOSIS — Z85828 Personal history of other malignant neoplasm of skin: Secondary | ICD-10-CM | POA: Diagnosis not present

## 2022-05-25 DIAGNOSIS — R1013 Epigastric pain: Secondary | ICD-10-CM | POA: Diagnosis present

## 2022-05-25 LAB — CBC WITH DIFFERENTIAL/PLATELET
Abs Immature Granulocytes: 0.07 10*3/uL (ref 0.00–0.07)
Basophils Absolute: 0.1 10*3/uL (ref 0.0–0.1)
Basophils Relative: 0 %
Eosinophils Absolute: 0 10*3/uL (ref 0.0–0.5)
Eosinophils Relative: 0 %
HCT: 45.1 % (ref 36.0–46.0)
Hemoglobin: 14.9 g/dL (ref 12.0–15.0)
Immature Granulocytes: 1 %
Lymphocytes Relative: 6 %
Lymphs Abs: 0.8 10*3/uL (ref 0.7–4.0)
MCH: 27.2 pg (ref 26.0–34.0)
MCHC: 33 g/dL (ref 30.0–36.0)
MCV: 82.3 fL (ref 80.0–100.0)
Monocytes Absolute: 0.3 10*3/uL (ref 0.1–1.0)
Monocytes Relative: 3 %
Neutro Abs: 11.9 10*3/uL — ABNORMAL HIGH (ref 1.7–7.7)
Neutrophils Relative %: 90 %
Platelets: 240 10*3/uL (ref 150–400)
RBC: 5.48 MIL/uL — ABNORMAL HIGH (ref 3.87–5.11)
RDW: 14.5 % (ref 11.5–15.5)
WBC: 13.1 10*3/uL — ABNORMAL HIGH (ref 4.0–10.5)
nRBC: 0 % (ref 0.0–0.2)

## 2022-05-25 LAB — CBC
HCT: 41.2 % (ref 36.0–46.0)
Hemoglobin: 13.5 g/dL (ref 12.0–15.0)
MCH: 27.4 pg (ref 26.0–34.0)
MCHC: 32.8 g/dL (ref 30.0–36.0)
MCV: 83.7 fL (ref 80.0–100.0)
Platelets: 222 10*3/uL (ref 150–400)
RBC: 4.92 MIL/uL (ref 3.87–5.11)
RDW: 14.6 % (ref 11.5–15.5)
WBC: 12 10*3/uL — ABNORMAL HIGH (ref 4.0–10.5)
nRBC: 0 % (ref 0.0–0.2)

## 2022-05-25 LAB — COMPREHENSIVE METABOLIC PANEL
ALT: 10 U/L (ref 0–44)
AST: 15 U/L (ref 15–41)
Albumin: 4.1 g/dL (ref 3.5–5.0)
Alkaline Phosphatase: 64 U/L (ref 38–126)
Anion gap: 11 (ref 5–15)
BUN: 12 mg/dL (ref 6–20)
CO2: 21 mmol/L — ABNORMAL LOW (ref 22–32)
Calcium: 8.8 mg/dL — ABNORMAL LOW (ref 8.9–10.3)
Chloride: 106 mmol/L (ref 98–111)
Creatinine, Ser: 0.85 mg/dL (ref 0.44–1.00)
GFR, Estimated: >60 mL/min (ref >60)
Glucose, Bld: 134 mg/dL — ABNORMAL HIGH (ref 70–99)
Potassium: 3.8 mmol/L (ref 3.5–5.1)
Sodium: 138 mmol/L (ref 135–145)
Total Bilirubin: 0.6 mg/dL (ref 0.3–1.2)
Total Protein: 7.4 g/dL (ref 6.5–8.1)

## 2022-05-25 LAB — URINALYSIS, ROUTINE W REFLEX MICROSCOPIC
Bilirubin Urine: NEGATIVE
Glucose, UA: NEGATIVE mg/dL
Hgb urine dipstick: NEGATIVE
Ketones, ur: NEGATIVE mg/dL
Leukocytes,Ua: NEGATIVE
Nitrite: NEGATIVE
Protein, ur: NEGATIVE mg/dL
Specific Gravity, Urine: >1.046 — ABNORMAL HIGH (ref 1.005–1.030)
pH: 7 (ref 5.0–8.0)

## 2022-05-25 LAB — CREATININE, SERUM
Creatinine, Ser: 0.82 mg/dL (ref 0.44–1.00)
GFR, Estimated: >60 mL/min (ref >60)

## 2022-05-25 LAB — LIPASE, BLOOD: Lipase: 18 U/L (ref 11–51)

## 2022-05-25 MED ORDER — ENOXAPARIN SODIUM 40 MG/0.4ML IJ SOSY: 40.0000 mg | PREFILLED_SYRINGE | INTRAMUSCULAR | Status: DC

## 2022-05-25 MED ORDER — MORPHINE SULFATE (PF) 4 MG/ML IV SOLN
4.0000 mg | Freq: Once | INTRAVENOUS | Status: AC
  Administered 2022-05-25: 4 mg via INTRAVENOUS
  Filled 2022-05-25: qty 1

## 2022-05-25 MED ORDER — PROCHLORPERAZINE EDISYLATE 10 MG/2ML IJ SOLN: 10.0000 mg | INTRAMUSCULAR | Status: DC | PRN

## 2022-05-25 MED ORDER — METRONIDAZOLE 500 MG/100ML IV SOLN
500.0000 mg | Freq: Once | INTRAVENOUS | Status: AC
  Administered 2022-05-25: 500 mg via INTRAVENOUS
  Filled 2022-05-25: qty 100

## 2022-05-25 MED ORDER — SIMETHICONE 80 MG PO CHEW: 80.0000 mg | CHEWABLE_TABLET | Freq: Four times a day (QID) | ORAL | Status: DC | PRN

## 2022-05-25 MED ORDER — ONDANSETRON HCL 4 MG/2ML IJ SOLN
4.0000 mg | Freq: Once | INTRAMUSCULAR | Status: AC
  Administered 2022-05-25: 4 mg via INTRAVENOUS
  Filled 2022-05-25: qty 2

## 2022-05-25 MED ORDER — LACTATED RINGERS IV SOLN: INTRAVENOUS | Status: DC

## 2022-05-25 MED ORDER — HYDROMORPHONE HCL 1 MG/ML IJ SOLN: 1.0000 mg | INTRAMUSCULAR | Status: DC | PRN

## 2022-05-25 MED ORDER — OXYCODONE HCL 5 MG PO TABS: 10.0000 mg | ORAL_TABLET | ORAL | Status: DC | PRN

## 2022-05-25 MED ORDER — ONDANSETRON HCL 4 MG/2ML IJ SOLN: 4.0000 mg | Freq: Four times a day (QID) | INTRAMUSCULAR | Status: DC | PRN

## 2022-05-25 MED ORDER — METHOCARBAMOL 1000 MG/10ML IJ SOLN: 500.0000 mg | Freq: Four times a day (QID) | INTRAVENOUS | Status: DC | PRN

## 2022-05-25 MED ORDER — SODIUM CHLORIDE 0.9 % IV SOLN
2.0000 g | Freq: Once | INTRAVENOUS | Status: AC
  Administered 2022-05-25: 2 g via INTRAVENOUS
  Filled 2022-05-25: qty 20

## 2022-05-25 MED ORDER — IOHEXOL 300 MG/ML  SOLN
100.0000 mL | Freq: Once | INTRAMUSCULAR | Status: AC | PRN
  Administered 2022-05-25: 100 mL via INTRAVENOUS

## 2022-05-25 MED ORDER — ONDANSETRON HCL 4 MG/2ML IJ SOLN
4.0000 mg | Freq: Once | INTRAMUSCULAR | Status: AC
Start: 2022-05-25 — End: 2022-05-25
  Administered 2022-05-25: 4 mg via INTRAVENOUS
  Filled 2022-05-25: qty 2

## 2022-05-25 MED ORDER — GABAPENTIN 600 MG PO TABS
300.0000 mg | ORAL_TABLET | Freq: Three times a day (TID) | ORAL | Status: DC
  Administered 2022-05-25: 300 mg via ORAL
  Filled 2022-05-25: qty 1
  Filled 2022-05-25: qty 0.5

## 2022-05-25 MED ORDER — SODIUM CHLORIDE 0.9 % IV BOLUS
1000.0000 mL | Freq: Once | INTRAVENOUS | Status: AC
  Administered 2022-05-25: 1000 mL via INTRAVENOUS

## 2022-05-25 MED ORDER — DOCUSATE SODIUM 100 MG PO CAPS: 100.0000 mg | ORAL_CAPSULE | Freq: Two times a day (BID) | ORAL | Status: DC

## 2022-05-25 MED ORDER — OXYCODONE HCL 5 MG PO TABS: 5.0000 mg | ORAL_TABLET | ORAL | Status: DC | PRN

## 2022-05-25 MED ORDER — ACETAMINOPHEN 325 MG PO TABS
650.0000 mg | ORAL_TABLET | Freq: Four times a day (QID) | ORAL | Status: DC
  Filled 2022-05-25: qty 2

## 2022-05-25 NOTE — ED Triage Notes (Signed)
Right side flank pain started yesterday, went away, woke up this am at 0630 with the pain again. Pt noticed urine has been darker past 2 days, no pain with urination. N/v today. No fevers

## 2022-05-25 NOTE — Discharge Instructions (Addendum)
The general surgeon was consulted today at [facility].  You are to go to [facility] for further evaluation by the general surgeon.

## 2022-05-25 NOTE — ED Provider Notes (Signed)
[facility] EMERGENCY DEPT Provider Note   CSN: [id number] Arrival date & time: 05/25/22  1122     History  Chief Complaint  Patient presents with   Flank Pain    LUCILLE WITTS is a 51 y.o. female who presents to the emergency department with concerns for right flank pain onset this morning.  Notes that her flank pain was sudden onset.  Also notes epigastric pain as well.  She has similar symptoms yesterday that has since subsided.  Has associated nausea and vomiting.  Has hematuria x 2 days. Tried Alka-Seltzer at 7:30 AM without relief of her symptoms. Denies fever. History of kidney stones, has been evaluated by an urologist in the past. Still has her gallbladder and appendix.      The history is provided by the patient. No language interpreter was used.       Home Medications Prior to Admission medications   Medication Sig Start Date End Date Taking? Authorizing Provider  acetaminophen (TYLENOL) 500 MG tablet Take 2 tablets (1,000 mg total) by mouth every 6 (six) hours as needed. 05/26/22 05/26/23 Yes Lovick, Montel Culver, MD  amphetamine-dextroamphetamine (ADDERALL) 20 MG tablet Take 1 tablet (20 mg total) by mouth daily. 05/21/22  Yes Isaac Bliss, Rayford Halsted, MD  docusate sodium (COLACE) 100 MG capsule Take 1 capsule (100 mg total) by mouth 2 (two) times daily. 05/26/22 05/26/23 Yes Lovick, Montel Culver, MD  escitalopram (LEXAPRO) 10 MG tablet Take 1 tablet (10 mg total) by mouth daily. 01/14/22  Yes Isaac Bliss, Rayford Halsted, MD  fluticasone [facility]) 50 MCG/ACT nasal spray Place 2 sprays into both nostrils daily. 12/19/19  Yes Martin, Mary-Margaret, FNP  ibuprofen (ADVIL) 200 MG tablet Take 200 mg by mouth every 6 (six) hours as needed for headache or moderate pain.   Yes [provider]  ibuprofen (ADVIL) 600 MG tablet Take 1 tablet (600 mg total) by mouth every 6 (six) hours as needed. 05/26/22  Yes Jesusita Oka, MD  levothyroxine (SYNTHROID) 100 MCG  tablet Take 1 tablet by mouth once daily Patient taking differently: Take 100 mcg by mouth daily before breakfast. 02/25/22  Yes Isaac Bliss, Rayford Halsted, MD  levothyroxine (SYNTHROID) 88 MCG tablet Take 1 tablet by mouth once daily Patient taking differently: Take 88 mcg by mouth daily before breakfast. 03/10/22  Yes Isaac Bliss, Rayford Halsted, MD  methocarbamol (ROBAXIN-750) 750 MG tablet Take 1 tablet (750 mg total) by mouth 4 (four) times daily. 05/26/22  Yes Jesusita Oka, MD  Multiple Vitamins-Minerals (MULTIVITAL PO) Take by mouth.   Yes [provider]  oxyCODONE (ROXICODONE) 5 MG immediate release tablet Take 1 tablet (5 mg total) by mouth every 4 (four) hours as needed for severe pain. 05/26/22  Yes Lovick, Montel Culver, MD  amphetamine-dextroamphetamine (ADDERALL) 20 MG tablet Take 1 tablet (20 mg total) by mouth daily. Patient not taking: Reported on 05/25/2022 01/13/22   Isaac Bliss, Rayford Halsted, MD  amphetamine-dextroamphetamine (ADDERALL) 20 MG tablet Take 1 tablet (20 mg total) by mouth daily. Patient not taking: Reported on 05/25/2022 01/13/22   Isaac Bliss, Rayford Halsted, MD      Allergies    Sulfa antibiotics and Sulfonamide derivatives    Review of Systems   Review of Systems  Constitutional:  Negative for fever.  Respiratory:  Negative for shortness of breath.   Cardiovascular:  Negative for chest pain.  Gastrointestinal:  Positive for abdominal pain, nausea and vomiting. Negative for constipation and diarrhea.  Genitourinary:  Positive for flank pain and hematuria. Negative for dysuria.  All other systems reviewed and are negative.   Physical Exam Updated Vital Signs BP (!) 177/80   Pulse 63   Temp 98.5 F (36.9 C)   Resp 16   LMP 04/26/2019 (Approximate)   SpO2 100%  Physical Exam Vitals and nursing note reviewed.  Constitutional:      General: She is not in acute distress.    Appearance: She is not diaphoretic.  HENT:     Head: Normocephalic and  atraumatic.     Mouth/Throat:     Pharynx: No oropharyngeal exudate.  Eyes:     General: No scleral icterus.    Conjunctiva/sclera: Conjunctivae normal.  Cardiovascular:     Rate and Rhythm: Normal rate and regular rhythm.     Pulses: Normal pulses.     Heart sounds: Normal heart sounds.  Pulmonary:     Effort: Pulmonary effort is normal. No respiratory distress.     Breath sounds: Normal breath sounds. No wheezing.  Abdominal:     General: Bowel sounds are normal.     Palpations: Abdomen is soft. There is no mass.     Tenderness: There is no abdominal tenderness. There is right CVA tenderness. There is no left CVA tenderness, guarding or rebound.     Comments: Diffuse abdominal tenderness to palpation, more so to the right lower quadrant, right upper quadrant, epigastric region.  Positive right CVA tenderness to palpation.  Musculoskeletal:        General: Normal range of motion.     Cervical back: Normal range of motion and neck supple.  Skin:    General: Skin is warm and dry.  Neurological:     Mental Status: She is alert.  Psychiatric:        Behavior: Behavior normal.     ED Results / Procedures / Treatments   Labs (all labs ordered are listed, but only abnormal results are displayed) Labs Reviewed  CBC WITH DIFFERENTIAL/PLATELET - Abnormal; Notable for the following components:      Result Value   WBC 13.1 (*)    RBC 5.48 (*)    Neutro Abs 11.9 (*)    All other components within normal limits  COMPREHENSIVE METABOLIC PANEL - Abnormal; Notable for the following components:   CO2 21 (*)    Glucose, Bld 134 (*)    Calcium 8.8 (*)    All other components within normal limits  URINALYSIS, ROUTINE W REFLEX MICROSCOPIC - Abnormal; Notable for the following components:   Specific Gravity, Urine >1.046 (*)    All other components within normal limits  CBC - Abnormal; Notable for the following components:   WBC 12.0 (*)    All other components within normal limits   HEPATIC FUNCTION PANEL - Abnormal; Notable for the following components:   Total Protein 5.9 (*)    Albumin 3.1 (*)    All other components within normal limits  BASIC METABOLIC PANEL - Abnormal; Notable for the following components:   Chloride 112 (*)    CO2 21 (*)    Calcium 8.0 (*)    Anion gap 4 (*)    All other components within normal limits  LIPASE, BLOOD  HIV ANTIBODY (ROUTINE TESTING W REFLEX)  CREATININE, SERUM  CBC  SURGICAL PATHOLOGY    EKG None  Radiology US Abdomen Limited RUQ (LIVER/GB)  Result Date: 05/25/2022 CLINICAL DATA:  Cholelithiasis on CT, right upper quadrant abdominal pain EXAM: ULTRASOUND ABDOMEN LIMITED  RIGHT UPPER QUADRANT COMPARISON:  CT abdomen/pelvis dated 05/25/2022 FINDINGS: Gallbladder: Multiple gallstones, including a 2.5 cm gallstone in the gallbladder neck. No gallbladder wall thickening or pericholecystic fluid. Positive sonographic Murphy's sign. Common bile duct: Diameter: 3 mm Liver: Hyperechoic hepatic parenchyma, suggesting hepatic steatosis. No focal hepatic lesion is seen. Portal vein is patent on color Doppler imaging with normal direction of blood flow towards the liver. Other: None. IMPRESSION: Cholelithiasis with positive sonographic Murphy's sign, without associated sonographic findings to suggest acute cholecystitis. Overall, while equivocal, this does not favor acute cholecystitis. Electronically Signed   By: Julian Hy M.D.   On: 05/25/2022 17:27   CT ABDOMEN PELVIS W CONTRAST  Result Date: 05/25/2022 CLINICAL DATA:  51 year old female with acute abdominal and pelvic pain. EXAM: CT ABDOMEN AND PELVIS WITH CONTRAST TECHNIQUE: Multidetector CT imaging of the abdomen and pelvis was performed using the standard protocol following bolus administration of intravenous contrast. RADIATION DOSE REDUCTION: This exam was performed according to the departmental dose-optimization program which includes automated exposure control, adjustment  of the mA and/or kV according to patient size and/or use of iterative reconstruction technique. CONTRAST:  159m OMNIPAQUE IOHEXOL 300 MG/ML  SOLN COMPARISON:  None Available. FINDINGS: Lower chest: No acute abnormality. Hepatobiliary: Cholelithiasis noted with the largest gallstone measuring 2.5 cm near the gallbladder neck. Equivocal mild gallbladder wall thickening is noted but without evidence of pericholecystic inflammation. The liver is unremarkable. There is no evidence of intrahepatic or extrahepatic biliary dilatation. Pancreas: Unremarkable Spleen: Unremarkable Adrenals/Urinary Tract: The kidneys, adrenal glands and bladder are unremarkable except for scattered areas of LEFT renal scarring. Stomach/Bowel: Stomach is within normal limits. Appendix appears normal. No evidence of bowel wall thickening, distention, or inflammatory changes. Colonic diverticulosis identified without evidence of acute diverticulitis. Vascular/Lymphatic: Aortic atherosclerosis. No enlarged abdominal or pelvic lymph nodes. Reproductive: Status post hysterectomy. No suspicious adnexal masses. Other: No ascites, focal collection or pneumoperitoneum. Musculoskeletal: No acute or suspicious bony abnormalities are noted. Mild-to-moderate degenerative disc disease at L1-L2 noted. IMPRESSION: 1. Cholelithiasis with equivocal mild gallbladder wall thickening but without evidence of pericholecystic inflammation. If there is clinical suspicion for acute cholecystitis, recommend ultrasound. No biliary dilatation. 2. Colonic diverticulosis without evidence of acute diverticulitis. 3. Aortic Atherosclerosis (ICD10-I70.0). Electronically Signed   By: JMargarette CanadaM.D.   On: 05/25/2022 15:56    Procedures Procedures    Medications Ordered in ED Medications  sodium chloride 0.9 % bolus 1,000 mL (0 mLs Intravenous Stopped 05/25/22 1844)  ondansetron (ZOFRAN) injection 4 mg (4 mg Intravenous Given 05/25/22 1525)  morphine (PF) 4 MG/ML  injection 4 mg (4 mg Intravenous Given 05/25/22 1527)  iohexol (OMNIPAQUE) 300 MG/ML solution 100 mL (100 mLs Intravenous Contrast Given 05/25/22 1532)  cefTRIAXone (ROCEPHIN) 2 g in sodium chloride 0.9 % 100 mL IVPB (0 g Intravenous Stopped 05/25/22 1919)  metroNIDAZOLE (FLAGYL) IVPB 500 mg (0 mg Intravenous Stopped 05/25/22 2202)  morphine (PF) 4 MG/ML injection 4 mg (4 mg Intravenous Given 05/25/22 2025)  ondansetron (ZOFRAN) injection 4 mg (4 mg Intravenous Given 05/25/22 2025)  chlorhexidine (PERIDEX) 0.12 % solution 15 mL (15 mLs Mouth/Throat Given 05/26/22 03875    Or  Oral care mouth rinse ( Mouth Rinse See Alternative 05/26/22 06433  amisulpride (BARHEMSYS) injection 10 mg (10 mg Intravenous Given 05/26/22 1008)  oxyCODONE (Oxy IR/ROXICODONE) immediate release tablet 5 mg (5 mg Oral Given 05/26/22 1004)    ED Course/ Medical Decision Making/ A&P Clinical Course as of 05/26/22 2007  Sun May 25, 2022  1647 Patient re-evaluated and noted to be resting comfortably and noted pressure sensation. [SB]  1819 Re-evaluated and noted to be 5-6/10 at this time. Nausea currently.  Discussed with patient ultrasound findings.  Discussed with patient plans to speak with general surgery.  Patient agreeable at this time.  Patient last had sips of water and Alka-Seltzer at 7:30 AM. [SB]  1828 Consult to general surgery who recommends admission at this time. [SB]  1830 Discussion with Dr. Truett Mainland who accepts the patient in transfer to [facility]. [SB]    Clinical Course User Index [SB] Johnn Krasowski A, PA-C                           Medical Decision Making Amount and/or Complexity of Data Reviewed Labs: ordered. Radiology: ordered.  Risk Prescription drug management. Decision regarding hospitalization.   Pt presents with sudden onset right flank pain onset this morning. Still has gallbladder and appendix. Pt afebrile. On exam, patient with Diffuse abdominal tenderness to palpation, more so to the  right lower quadrant, right upper quadrant, epigastric region.  Positive right CVA tenderness to palpation. no acute cardiovascular or respiratory exam findings. Differential diagnosis includes cholecystitis, appendicitis, pancreatitis, acute cystitis, pyelonephritis, nephrolithiasis.     Labs:  I ordered, and personally interpreted labs.  The pertinent results include: Urinalysis unremarkable CBC with leukocytosis CMP unremarkable Lipase unremarkable  Imaging: I ordered imaging studies including CT AP, RUQ [place] I independently visualized and interpreted imaging which showed: CT AP:  1. Cholelithiasis with equivocal mild gallbladder wall thickening  but without evidence of pericholecystic inflammation. If there is  clinical suspicion for acute cholecystitis, recommend ultrasound. No  biliary dilatation.  2. Colonic diverticulosis without evidence of acute diverticulitis.  3. Aortic Atherosclerosis (ICD10-I70.0).   RUQ [place]:  Cholelithiasis with positive sonographic Murphy's sign, without  associated sonographic findings to suggest acute cholecystitis.  Overall, while equivocal, this does not favor acute cholecystitis.   I agree with the radiologist interpretation  Medications:  I ordered medication including morphine, zofran, flagyl, rocephin for pain management, antibiotic prophylaxis Reevaluation of the patient after these medicines and interventions, I reevaluated the patient and found that they have improved I have reviewed the patients home medicines and have made adjustments as needed  Consultations: I requested consultation with the General Surgery, Dr. Greer Pickerel and discussed lab and imaging findings as well as pertinent plan - they recommend: evaluation of the patient at [facility]   Disposition: Presentation suspicious for symptomatic cholelithiasis. Doubt pyelonephritis or nephrolithiasis at this time. After consideration of the diagnostic results and the patients  response to treatment, I feel that the patient would benefit from Transfer to. Pt to be a ED-ED transfer for evaluation by surgery. Pt agreeable at this time. Pt appears safe for transfer.  This chart was dictated using voice recognition software, Dragon. Despite the best efforts of this provider to proofread and correct errors, errors may still occur which can change documentation meaning.   Final Clinical Impression(s) / ED Diagnoses Final diagnoses:  Symptomatic cholelithiasis    Rx / DC Orders ED Discharge Orders          Ordered    acetaminophen (TYLENOL) 500 MG tablet  Every 6 hours PRN        05/26/22 0921    methocarbamol (ROBAXIN-750) 750 MG tablet  4 times daily        05/26/22 0921    ibuprofen (ADVIL) 600  MG tablet  Every 6 hours PRN        05/26/22 0921    oxyCODONE (ROXICODONE) 5 MG immediate release tablet  Every 4 hours PRN        05/26/22 0921    docusate sodium (COLACE) 100 MG capsule  2 times daily        05/26/22 0921              Randee Huston A, PA-C 05/26/22 [address] [place] K, DO 06/05/22 [phone]

## 2022-05-25 NOTE — ED Provider Notes (Signed)
8:12 PM Patient arrives as a transfer from droppage to get seen by general surgery to discuss disposition in regards to suspected acute cholecystitis.  Patient reports her pain is a 7 out of 10 again and requested more pain and nausea medicine.  This will be ordered.  On my exam, lungs clear and chest nontender.  Patient is resting.  We will call general surgery to let them know their patient has arrived.  They will come see patient.   Madolyn Ackroyd, Gwenyth Allegra, MD 05/25/22 2018

## 2022-05-25 NOTE — ED Notes (Signed)
Chol here for gen surg consult.   VS  152/86 84 18 99%

## 2022-05-25 NOTE — ED Notes (Signed)
US at bedside

## 2022-05-25 NOTE — ED Notes (Signed)
Patient transported to CT 

## 2022-05-25 NOTE — H&P (Signed)
Admitting Physician: Nickola Major Adal Sereno  Service: General Surgery  CC: Abdominal pain  Subjective   HPI: Madison Tapia is an 51 y.o. female who is here for abdominal pain.  She think she has had pain like this before.  It is located under her right rib.  It became very very severe.  The pain might of been around a little bit yesterday, but this morning she woke up with severe right upper quadrant abdominal pain right underneath the rib.  It was associate with nausea and vomiting.  It was so bad she went to [facility] and was evaluated, then transferred to [facility] for admission of the surgery service.  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/14/2007   Anemia 2020   ANXIETY 06/24/2007   Cancer ([place])    topical skin melanoma   Chronic kidney disease    kidney stones   COMMON MIGRAINE 06/24/2007   DEPRESSION 06/24/2007   Family history of adverse reaction to anesthesia    family -extreme nausea, hallucinations (mother)   Headache(784.0) 10/08/2007   HX, URINARY INFECTION 06/24/2007   IRRITABLE BOWEL SYNDROME, HX OF 06/24/2007   MALAISE AND FATIGUE 06/24/2007   NEPHROLITHIASIS, HX OF 06/24/2007   Sleep apnea    no cpap   Unspecified hypothyroidism 07/14/2007    Past Surgical History:  Procedure Laterality Date   KIDNEY STONE SURGERY     basket extraction-age 49   melanoma surgery  2018   from back   ROBOTIC ASSISTED LAPAROSCOPIC HYSTERECTOMY AND SALPINGECTOMY Bilateral 05/24/2019   Procedure: ATTEMPTED XI ROBOTIC ASSISTED TOTAL LAPAROSCOPIC HYSTERECTOMY AND SALPINGECTOMY, CONVERTED TO OPEN  PROEDURE: TOTAL  ABDOMINAL HYSTERECTOMY WITH BILATERAL SALPINGECTOMY;  Surgeon: Princess Bruins, MD;  Location: [facility];  Service: Gynecology;  Laterality: Bilateral;  request 7:30am OR time in [place] block requests two hours Will contact [place] re: RNFA t    Family History  Problem Relation Age of Onset   Thyroid disease Mother    Breast  cancer Mother 85   Diabetes Father    Hypertension Father    Mental illness Father    Depression Neg Hx        family hx   Colon cancer Neg Hx    Esophageal cancer Neg Hx    Rectal cancer Neg Hx    Stomach cancer Neg Hx     Social:  reports that she has never smoked. She has never used smokeless tobacco. She reports current alcohol use. She reports that she does not use drugs.  Allergies:  Allergies  Allergen Reactions   Sulfa Antibiotics Hives   Sulfonamide Derivatives Hives    Medications: Current Outpatient Medications  Medication Instructions   amphetamine-dextroamphetamine (ADDERALL) 20 MG tablet 20 mg, Oral, Daily   amphetamine-dextroamphetamine (ADDERALL) 20 MG tablet 20 mg, Oral, Daily   amphetamine-dextroamphetamine (ADDERALL) 20 MG tablet 20 mg, Oral, Daily   escitalopram (LEXAPRO) 10 mg, Oral, Daily   fluticasone (FLONASE) 50 MCG/ACT nasal spray 2 sprays, Each Nare, Daily   ibuprofen (ADVIL) 400-800 mg, Oral, Every 6 hours PRN   levothyroxine (SYNTHROID) 100 MCG tablet Take 1 tablet by mouth once daily   levothyroxine (SYNTHROID) 88 MCG tablet Take 1 tablet by mouth once daily   Multiple Vitamins-Minerals (MULTIVITAL PO) Oral    ROS - all of the below systems have been reviewed with the patient and positives are indicated with bold text General: chills, fever or night sweats Eyes: blurry vision or double  vision ENT: epistaxis or sore throat Allergy/Immunology: itchy/watery eyes or nasal congestion Hematologic/Lymphatic: bleeding problems, blood clots or swollen lymph nodes Endocrine: temperature intolerance or unexpected weight changes Breast: new or changing breast lumps or nipple discharge Resp: cough, shortness of breath, or wheezing CV: chest pain or dyspnea on exertion GI: as per HPI GU: dysuria, trouble voiding, or hematuria MSK: joint pain or joint stiffness Neuro: TIA or stroke symptoms Derm: pruritus and skin lesion changes Psych: anxiety and  depression  Objective   PE Blood pressure 139/73, pulse 65, temperature 98.3 F (36.8 C), temperature source Oral, resp. rate 18, last menstrual period 04/26/2019, SpO2 98 %. Constitutional: NAD; conversant; no deformities Eyes: Moist conjunctiva; no lid lag; anicteric; PERRL Neck: Trachea midline; no thyromegaly Lungs: Normal respiratory effort; no tactile fremitus CV: RRR; no palpable thrills; no pitting edema GI: Abd RUQ tenderness; no palpable hepatosplenomegaly MSK: Normal range of motion of extremities; no clubbing/cyanosis Psychiatric: Appropriate affect; alert and oriented x3 Lymphatic: No palpable cervical or axillary lymphadenopathy  Results for orders placed or performed during the hospital encounter of 05/25/22 (from the past 24 hour(s))  CBC with Differential     Status: Abnormal   Collection Time: 05/25/22  3:17 PM  Result Value Ref Range   WBC 13.1 (H) 4.0 - 10.5 K/uL   RBC 5.48 (H) 3.87 - 5.11 MIL/uL   Hemoglobin 14.9 12.0 - 15.0 g/dL   HCT 45.1 36.0 - 46.0 %   MCV 82.3 80.0 - 100.0 fL   MCH 27.2 26.0 - 34.0 pg   MCHC 33.0 30.0 - 36.0 g/dL   RDW 14.5 11.5 - 15.5 %   Platelets 240 150 - 400 K/uL   nRBC 0.0 0.0 - 0.2 %   Neutrophils Relative % 90 %   Neutro Abs 11.9 (H) 1.7 - 7.7 K/uL   Lymphocytes Relative 6 %   Lymphs Abs 0.8 0.7 - 4.0 K/uL   Monocytes Relative 3 %   Monocytes Absolute 0.3 0.1 - 1.0 K/uL   Eosinophils Relative 0 %   Eosinophils Absolute 0.0 0.0 - 0.5 K/uL   Basophils Relative 0 %   Basophils Absolute 0.1 0.0 - 0.1 K/uL   Immature Granulocytes 1 %   Abs Immature Granulocytes 0.07 0.00 - 0.07 K/uL  Comprehensive metabolic panel     Status: Abnormal   Collection Time: 05/25/22  3:17 PM  Result Value Ref Range   Sodium 138 135 - 145 mmol/L   Potassium 3.8 3.5 - 5.1 mmol/L   Chloride 106 98 - 111 mmol/L   CO2 21 (L) 22 - 32 mmol/L   Glucose, Bld 134 (H) 70 - 99 mg/dL   BUN 12 6 - 20 mg/dL   Creatinine, Ser 0.85 0.44 - 1.00 mg/dL    Calcium 8.8 (L) 8.9 - 10.3 mg/dL   Total Protein 7.4 6.5 - 8.1 g/dL   Albumin 4.1 3.5 - 5.0 g/dL   AST 15 15 - 41 U/L   ALT 10 0 - 44 U/L   Alkaline Phosphatase 64 38 - 126 U/L   Total Bilirubin 0.6 0.3 - 1.2 mg/dL   GFR, Estimated >60 >60 mL/min   Anion gap 11 5 - 15  Lipase, blood     Status: None   Collection Time: 05/25/22  3:17 PM  Result Value Ref Range   Lipase 18 11 - 51 U/L  Urinalysis, Routine w reflex microscopic Urine, Clean Catch     Status: Abnormal   Collection Time: 05/25/22  5:40 PM  Result Value Ref Range   Color, Urine YELLOW YELLOW   APPearance CLEAR CLEAR   Specific Gravity, Urine >1.046 (H) 1.005 - 1.030   pH 7.0 5.0 - 8.0   Glucose, UA NEGATIVE NEGATIVE mg/dL   Hgb urine dipstick NEGATIVE NEGATIVE   Bilirubin Urine NEGATIVE NEGATIVE   Ketones, ur NEGATIVE NEGATIVE mg/dL   Protein, ur NEGATIVE NEGATIVE mg/dL   Nitrite NEGATIVE NEGATIVE   Leukocytes,Ua NEGATIVE NEGATIVE     Imaging Orders         CT ABDOMEN PELVIS W CONTRAST    1. Cholelithiasis with equivocal mild gallbladder wall thickening but without evidence of pericholecystic inflammation. If there is clinical suspicion for acute cholecystitis, recommend ultrasound. No biliary dilatation. 2. Colonic diverticulosis without evidence of acute diverticulitis. 3. Aortic Atherosclerosis (ICD10-I70.0).           US Abdomen Limited RUQ (LIVER/GB)    Cholelithiasis with positive sonographic Murphy's sign, without associated sonographic findings to suggest acute cholecystitis. Overall, while equivocal, this does not favor acute cholecystitis.  Multiple gallstones, including a 2.5 cm gallstone in the gallbladder neck. No gallbladder wall thickening or pericholecystic fluid. Positive sonographic Murphy's sign. Common bile duct: Diameter: 3 mm      Assessment and Plan   Madison Tapia is an 51 y.o. female with abdominal pain and gallstones consistent with acute cholecystitis.  I recommended  laparoscopic cholecystectomy.  The procedure, its risks, benefits and alternatives were discussed and the patient granted consent to proceed.  I will discuss the case with Dr. Bobbye Morton who takes over at [facility] and work on getting her to the emergency general surgery service operating room in the morning.    ICD-10-CM   1. Symptomatic cholelithiasis  K80.20        Felicie Morn, MD  [facility] Use [email or web address] to contact on call provider  New Patient Billing: [phone] - High MDM

## 2022-05-26 ENCOUNTER — Other Ambulatory Visit: Payer: Self-pay

## 2022-05-26 ENCOUNTER — Inpatient Hospital Stay (HOSPITAL_COMMUNITY): Payer: 59 | Admitting: Anesthesiology

## 2022-05-26 ENCOUNTER — Inpatient Hospital Stay (HOSPITAL_COMMUNITY): Payer: 59

## 2022-05-26 ENCOUNTER — Encounter (HOSPITAL_COMMUNITY): Payer: Self-pay

## 2022-05-26 ENCOUNTER — Encounter (HOSPITAL_COMMUNITY)
Admission: EM | Disposition: A | Discharge status: Home / Self Care | Payer: Self-pay | Source: Home / Self Care | Attending: Emergency Medicine

## 2022-05-26 DIAGNOSIS — E039 Hypothyroidism, unspecified: Secondary | ICD-10-CM

## 2022-05-26 DIAGNOSIS — K810 Acute cholecystitis: Secondary | ICD-10-CM

## 2022-05-26 DIAGNOSIS — G4730 Sleep apnea, unspecified: Secondary | ICD-10-CM

## 2022-05-26 DIAGNOSIS — D638 Anemia in other chronic diseases classified elsewhere: Secondary | ICD-10-CM

## 2022-05-26 DIAGNOSIS — F418 Other specified anxiety disorders: Secondary | ICD-10-CM

## 2022-05-26 HISTORY — PX: CHOLECYSTECTOMY: SHX55

## 2022-05-26 LAB — CBC
HCT: 40 % (ref 36.0–46.0)
Hemoglobin: 12.9 g/dL (ref 12.0–15.0)
MCH: 27.8 pg (ref 26.0–34.0)
MCHC: 32.3 g/dL (ref 30.0–36.0)
MCV: 86.2 fL (ref 80.0–100.0)
Platelets: 209 10*3/uL (ref 150–400)
RBC: 4.64 MIL/uL (ref 3.87–5.11)
RDW: 14.7 % (ref 11.5–15.5)
WBC: 8.7 10*3/uL (ref 4.0–10.5)
nRBC: 0 % (ref 0.0–0.2)

## 2022-05-26 LAB — HIV ANTIBODY (ROUTINE TESTING W REFLEX): HIV Screen 4th Generation wRfx: NONREACTIVE

## 2022-05-26 LAB — BASIC METABOLIC PANEL
Anion gap: 4 — ABNORMAL LOW (ref 5–15)
BUN: 10 mg/dL (ref 6–20)
CO2: 21 mmol/L — ABNORMAL LOW (ref 22–32)
Calcium: 8 mg/dL — ABNORMAL LOW (ref 8.9–10.3)
Chloride: 112 mmol/L — ABNORMAL HIGH (ref 98–111)
Creatinine, Ser: 0.85 mg/dL (ref 0.44–1.00)
GFR, Estimated: >60 mL/min (ref >60)
Glucose, Bld: 91 mg/dL (ref 70–99)
Potassium: 3.6 mmol/L (ref 3.5–5.1)
Sodium: 137 mmol/L (ref 135–145)

## 2022-05-26 LAB — HEPATIC FUNCTION PANEL
ALT: 14 U/L (ref 0–44)
AST: 17 U/L (ref 15–41)
Albumin: 3.1 g/dL — ABNORMAL LOW (ref 3.5–5.0)
Alkaline Phosphatase: 49 U/L (ref 38–126)
Bilirubin, Direct: 0.2 mg/dL (ref 0.0–0.2)
Indirect Bilirubin: 0.5 mg/dL (ref 0.3–0.9)
Total Bilirubin: 0.7 mg/dL (ref 0.3–1.2)
Total Protein: 5.9 g/dL — ABNORMAL LOW (ref 6.5–8.1)

## 2022-05-26 SURGERY — LAPAROSCOPIC CHOLECYSTECTOMY WITH INTRAOPERATIVE CHOLANGIOGRAM
Anesthesia: General | Site: Abdomen

## 2022-05-26 MED ORDER — CHLORHEXIDINE GLUCONATE 0.12 % MT SOLN
15.0000 mL | Freq: Once | OROMUCOSAL | Status: AC
Start: 2022-05-26 — End: 2022-05-26
  Administered 2022-05-26: 15 mL via OROMUCOSAL
  Filled 2022-05-26: qty 15

## 2022-05-26 MED ORDER — SUGAMMADEX SODIUM 500 MG/5ML IV SOLN
INTRAVENOUS | Status: DC | PRN
  Administered 2022-05-26: 500 mg via INTRAVENOUS

## 2022-05-26 MED ORDER — PROMETHAZINE HCL 25 MG/ML IJ SOLN: 6.2500 mg | INTRAMUSCULAR | Status: DC | PRN

## 2022-05-26 MED ORDER — BUPIVACAINE-EPINEPHRINE 0.25% -1:200000 IJ SOLN
INTRAMUSCULAR | Status: DC | PRN
  Administered 2022-05-26: 16 mL

## 2022-05-26 MED ORDER — OXYCODONE HCL 5 MG PO TABS: 5.0000 mg | ORAL_TABLET | ORAL | 0 refills | Status: DC | PRN

## 2022-05-26 MED ORDER — HYDROMORPHONE HCL 1 MG/ML IJ SOLN
0.2500 mg | INTRAMUSCULAR | Status: DC | PRN
  Administered 2022-05-26: 0.25 mg via INTRAVENOUS

## 2022-05-26 MED ORDER — ROCURONIUM BROMIDE 10 MG/ML (PF) SYRINGE
PREFILLED_SYRINGE | INTRAVENOUS | Status: DC | PRN
  Administered 2022-05-26: 70 mg via INTRAVENOUS

## 2022-05-26 MED ORDER — AMISULPRIDE (ANTIEMETIC) 5 MG/2ML IV SOLN
10.0000 mg | Freq: Once | INTRAVENOUS | Status: AC | PRN
  Administered 2022-05-26: 10 mg via INTRAVENOUS

## 2022-05-26 MED ORDER — HYDROMORPHONE HCL 1 MG/ML IJ SOLN
INTRAMUSCULAR | Status: DC | PRN
  Administered 2022-05-26: .5 mg via INTRAVENOUS

## 2022-05-26 MED ORDER — AMISULPRIDE (ANTIEMETIC) 5 MG/2ML IV SOLN
INTRAVENOUS | Status: AC
  Filled 2022-05-26: qty 4

## 2022-05-26 MED ORDER — METHOCARBAMOL 750 MG PO TABS: 750.0000 mg | ORAL_TABLET | Freq: Four times a day (QID) | ORAL | 1 refills | Status: DC

## 2022-05-26 MED ORDER — BUPIVACAINE-EPINEPHRINE (PF) 0.25% -1:200000 IJ SOLN
INTRAMUSCULAR | Status: AC
  Filled 2022-05-26: qty 30

## 2022-05-26 MED ORDER — DEXTROSE 5 % IV SOLN
INTRAVENOUS | Status: DC | PRN
  Administered 2022-05-26: 3 g via INTRAVENOUS

## 2022-05-26 MED ORDER — LIDOCAINE 2% (20 MG/ML) 5 ML SYRINGE
INTRAMUSCULAR | Status: DC | PRN
  Administered 2022-05-26: 100 mg via INTRAVENOUS

## 2022-05-26 MED ORDER — HYDROMORPHONE HCL 1 MG/ML IJ SOLN
INTRAMUSCULAR | Status: AC
  Filled 2022-05-26: qty 1

## 2022-05-26 MED ORDER — DOCUSATE SODIUM 100 MG PO CAPS: 100.0000 mg | ORAL_CAPSULE | Freq: Two times a day (BID) | ORAL | 2 refills | Status: AC

## 2022-05-26 MED ORDER — MIDAZOLAM HCL 2 MG/2ML IJ SOLN
INTRAMUSCULAR | Status: AC
  Filled 2022-05-26: qty 2

## 2022-05-26 MED ORDER — OXYCODONE HCL 5 MG PO TABS
5.0000 mg | ORAL_TABLET | Freq: Once | ORAL | Status: AC
  Administered 2022-05-26: 5 mg via ORAL

## 2022-05-26 MED ORDER — MEPERIDINE HCL 25 MG/ML IJ SOLN: 6.2500 mg | INTRAMUSCULAR | Status: DC | PRN

## 2022-05-26 MED ORDER — LACTATED RINGERS IV SOLN
INTRAVENOUS | Status: DC
Start: 2022-05-26 — End: 2022-05-26

## 2022-05-26 MED ORDER — ORAL CARE MOUTH RINSE: 15.0000 mL | Freq: Once | OROMUCOSAL | Status: AC

## 2022-05-26 MED ORDER — OXYCODONE HCL 5 MG PO TABS
ORAL_TABLET | ORAL | Status: AC
  Filled 2022-05-26: qty 1

## 2022-05-26 MED ORDER — ACETAMINOPHEN 10 MG/ML IV SOLN
INTRAVENOUS | Status: AC
Start: 2022-05-26 — End: ?
  Filled 2022-05-26: qty 100

## 2022-05-26 MED ORDER — PHENYLEPHRINE 80 MCG/ML (10ML) SYRINGE FOR IV PUSH (FOR BLOOD PRESSURE SUPPORT)
PREFILLED_SYRINGE | INTRAVENOUS | Status: DC | PRN
  Administered 2022-05-26: 80 ug via INTRAVENOUS

## 2022-05-26 MED ORDER — FENTANYL CITRATE (PF) 250 MCG/5ML IJ SOLN
INTRAMUSCULAR | Status: AC
  Filled 2022-05-26: qty 5

## 2022-05-26 MED ORDER — IBUPROFEN 600 MG PO TABS: 600.0000 mg | ORAL_TABLET | Freq: Four times a day (QID) | ORAL | 1 refills | Status: DC | PRN

## 2022-05-26 MED ORDER — HYDROMORPHONE HCL 1 MG/ML IJ SOLN
INTRAMUSCULAR | Status: AC
  Filled 2022-05-26: qty 0.5

## 2022-05-26 MED ORDER — ESMOLOL HCL 100 MG/10ML IV SOLN
INTRAVENOUS | Status: DC | PRN
  Administered 2022-05-26: 30 mg via INTRAVENOUS

## 2022-05-26 MED ORDER — ACETAMINOPHEN 10 MG/ML IV SOLN
INTRAVENOUS | Status: DC | PRN
  Administered 2022-05-26: 1000 mg via INTRAVENOUS

## 2022-05-26 MED ORDER — 0.9 % SODIUM CHLORIDE (POUR BTL) OPTIME
TOPICAL | Status: DC | PRN
  Administered 2022-05-26: 1000 mL

## 2022-05-26 MED ORDER — PROPOFOL 10 MG/ML IV BOLUS
INTRAVENOUS | Status: DC | PRN
  Administered 2022-05-26: 50 mg via INTRAVENOUS
  Administered 2022-05-26: 150 mg via INTRAVENOUS

## 2022-05-26 MED ORDER — FENTANYL CITRATE (PF) 250 MCG/5ML IJ SOLN
INTRAMUSCULAR | Status: DC | PRN
Start: 2022-05-26 — End: 2022-05-26
  Administered 2022-05-26: 50 ug via INTRAVENOUS
  Administered 2022-05-26: 100 ug via INTRAVENOUS

## 2022-05-26 MED ORDER — PROPOFOL 10 MG/ML IV BOLUS
INTRAVENOUS | Status: AC
  Filled 2022-05-26: qty 20

## 2022-05-26 MED ORDER — SODIUM CHLORIDE 0.9 % IR SOLN
Status: DC | PRN
  Administered 2022-05-26: 1000 mL

## 2022-05-26 MED ORDER — MIDAZOLAM HCL 5 MG/5ML IJ SOLN
INTRAMUSCULAR | Status: DC | PRN
  Administered 2022-05-26: 2 mg via INTRAVENOUS

## 2022-05-26 MED ORDER — SCOPOLAMINE 1 MG/3DAYS TD PT72: 1.0000 | MEDICATED_PATCH | TRANSDERMAL | Status: DC

## 2022-05-26 MED ORDER — ACETAMINOPHEN 500 MG PO TABS: 1000.0000 mg | ORAL_TABLET | Freq: Four times a day (QID) | ORAL | 2 refills | Status: AC | PRN

## 2022-05-26 SURGICAL SUPPLY — 43 items
ADH SKN CLS APL DERMABOND .7 (GAUZE/BANDAGES/DRESSINGS) ×1
APL PRP STRL LF DISP 70% ISPRP (MISCELLANEOUS) ×1
APPLIER CLIP 5 13 M/L LIGAMAX5 (MISCELLANEOUS) ×1
APR CLP MED LRG 5 ANG JAW (MISCELLANEOUS) ×1
BAG COUNTER SPONGE SURGICOUNT (BAG) ×1 IMPLANT
BAG SPEC RTRVL LRG 6X4 10 (ENDOMECHANICALS)
BAG SPNG CNTER NS LX DISP (BAG) ×1
BLADE CLIPPER SURG (BLADE) IMPLANT
CANISTER SUCT 3000ML PPV (MISCELLANEOUS) ×1 IMPLANT
CHLORAPREP W/TINT 26 (MISCELLANEOUS) ×1 IMPLANT
CLIP APPLIE 5 13 M/L LIGAMAX5 (MISCELLANEOUS) ×1 IMPLANT
COVER MAYO STAND STRL (DRAPES) IMPLANT
COVER SURGICAL LIGHT HANDLE (MISCELLANEOUS) ×1 IMPLANT
DERMABOND ADVANCED (GAUZE/BANDAGES/DRESSINGS) ×1
DERMABOND ADVANCED .7 DNX12 (GAUZE/BANDAGES/DRESSINGS) ×1 IMPLANT
DISSECTOR BLUNT TIP ENDO 5MM (MISCELLANEOUS) ×1 IMPLANT
DRAPE C-ARM 42X120 X-RAY (DRAPES) IMPLANT
ELECT REM PT RETURN 9FT ADLT (ELECTROSURGICAL) ×1
ELECTRODE REM PT RTRN 9FT ADLT (ELECTROSURGICAL) ×1 IMPLANT
GLOVE BIO SURGEON STRL SZ 6.5 (GLOVE) ×1 IMPLANT
GLOVE BIOGEL PI IND STRL 6 (GLOVE) ×1 IMPLANT
GLOVE BIOGEL PI INDICATOR 6 (GLOVE) ×1
GOWN STRL REUS W/ TWL LRG LVL3 (GOWN DISPOSABLE) ×3 IMPLANT
GOWN STRL REUS W/TWL LRG LVL3 (GOWN DISPOSABLE) ×3
KIT BASIN OR (CUSTOM PROCEDURE TRAY) ×1 IMPLANT
KIT TURNOVER KIT B (KITS) ×1 IMPLANT
NS IRRIG 1000ML POUR BTL (IV SOLUTION) ×1 IMPLANT
PAD ARMBOARD 7.5X6 YLW CONV (MISCELLANEOUS) ×1 IMPLANT
POUCH SPECIMEN RETRIEVAL 10MM (ENDOMECHANICALS) IMPLANT
SCISSORS LAP 5X35 DISP (ENDOMECHANICALS) ×1 IMPLANT
SET CHOLANGIOGRAPH 5 50 .035 (SET/KITS/TRAYS/PACK) IMPLANT
SET IRRIG TUBING LAPAROSCOPIC (IRRIGATION / IRRIGATOR) IMPLANT
SET TUBE SMOKE EVAC HIGH FLOW (TUBING) ×1 IMPLANT
SLEEVE ENDOPATH XCEL 5M (ENDOMECHANICALS) ×2 IMPLANT
SPECIMEN JAR SMALL (MISCELLANEOUS) ×1 IMPLANT
SUT MNCRL AB 4-0 PS2 18 (SUTURE) ×1 IMPLANT
SUT VICRYL 0 UR6 27IN ABS (SUTURE) IMPLANT
TOWEL GREEN STERILE (TOWEL DISPOSABLE) ×1 IMPLANT
TOWEL GREEN STERILE FF (TOWEL DISPOSABLE) ×1 IMPLANT
TRAY LAPAROSCOPIC MC (CUSTOM PROCEDURE TRAY) ×1 IMPLANT
TROCAR XCEL BLUNT TIP 100MML (ENDOMECHANICALS) ×1 IMPLANT
TROCAR Z-THREAD OPTICAL 5X100M (TROCAR) ×1 IMPLANT
WATER STERILE IRR 1000ML POUR (IV SOLUTION) ×1 IMPLANT

## 2022-05-26 NOTE — Op Note (Signed)
   Operative Note  Date: 05/26/2022  Procedure: laparoscopic cholecystectomy  Pre-op diagnosis: acute cholecystitis Post-op diagnosis: same  Indication and clinical history: The patient is a 51 y.o. year old female with acute cholecystitis.  Surgeon: Jesusita Oka, MD  Anesthesiologist: Lissa Hoard, MD Anesthesia: General  Findings:  Specimen: gallbladder EBL: <5cc Drains/Implants: none  Disposition: PACU - hemodynamically stable.  Description of procedure: The patient was positioned supine on the operating room table. Time-out was performed verifying correct patient, procedure, signature of informed consent, and administration of pre-operative antibiotics. General anesthetic induction and intubation were uneventful. The abdomen was prepped and draped in the usual sterile fashion. An infra-umbilical incision was made using an open technique using zero vicryl stay sutures on either side of the fascia and a 50m Hassan port inserted. After establishing pneumoperitoneum, which the patient tolerated well, the abdominal cavity was inspected and no injury of any intra-abdominal structures was identified. Additional ports were placed under direct visualization and using local anesthetic: two 531mports in the right subcostal region and a 46m146mort in the epigastric region. The patient was re-positioned to reverse Trendelenburg and right side up. Adhesiolysis was performed to expose the gallbladder, which was then retracted cephalad. The infundibulum was identified and retracted toward the right lower quadrant. The peritoneum was incised over the infundibulum and the triangle of Calot dissected to expose the critical view of safety. With clear identification and isolation of the cystic duct and cystic artery, the cystic artery was doubly clipped and divided. After this, the cystic duct was identified as a single structure entering the gallbladder, and was also doubly clipped and divided. The  gallbladder was dissected off the liver bed using electrocautery and hemostasis of the liver bed was confirmed prior to separation of the final peritoneal attachments of the gallbladder to the liver bed. The gallbladder fossa was irrigated and fluid returned clear. After transection of the final peritoneal attachments, the gallbladder was placed in an endoscopic specimen retrieval bag, removed via the umbilical port site, and sent to pathology as a permanent specimen. The gallbladder fossa was inspected confirming hemostasis, the absence of bile leakage from the cystic duct stump, and correct placement of clips on the cystic artery and cystic duct stumps. The abdomen was desufflated and the fascia of the umbilical port site was closed using the previously placed stay sutures. Additional local anesthetic was administered at the umbilical port site.  The skin of all incisions was closed with 4-0 monocryl. Sterile dressings were applied. All sponge and instrument counts were correct at the conclusion of the procedure. The patient was awakened from anesthesia, extubated uneventfully, and transported to the PACU - hemodynamically stable.. There were no complications.   Upon entering the abdomen (organ space), I encountered infection of the gallbladder .  CASE DATA:  Type of patient?: DOW CASE ([facility]urgical Hospitalist [facility])  Status of Case? URGENT Add On  Infection Present At Time Of Surgery (PATOS)?  INFECTION of the gallbladder    AyeJesusita OkaD General and TraCubargery

## 2022-05-26 NOTE — Transfer of Care (Signed)
Immediate Anesthesia Transfer of Care Note  Patient: Madison Tapia  Procedure(s) Performed: LAPAROSCOPIC CHOLECYSTECTOMY (Abdomen)  Patient Location: PACU  Anesthesia Type:General  Level of Consciousness: drowsy and patient cooperative  Airway & Oxygen Therapy: Patient Spontanous Breathing and Patient connected to nasal cannula oxygen  Post-op Assessment: Report given to RN and Post -op Vital signs reviewed and stable  Post vital signs: Reviewed and stable  Last Vitals:  Vitals Value Taken Time  BP 169/75 05/26/22 0930  Temp    Pulse 74 05/26/22 0931  Resp 23 05/26/22 0931  SpO2 89 % 05/26/22 0931  Vitals shown include unvalidated device data.  Last Pain:  Vitals:   05/26/22 0622  TempSrc: Oral  PainSc:          Complications: No notable events documented.

## 2022-05-26 NOTE — Anesthesia Postprocedure Evaluation (Signed)
Anesthesia Post Note  Patient: Madison Tapia  Procedure(s) Performed: LAPAROSCOPIC CHOLECYSTECTOMY (Abdomen)     Patient location during evaluation: PACU Anesthesia Type: General Level of consciousness: sedated and patient cooperative Pain management: pain level controlled Vital Signs Assessment: post-procedure vital signs reviewed and stable Respiratory status: spontaneous breathing Cardiovascular status: stable Anesthetic complications: no   No notable events documented.  Last Vitals:  Vitals:   05/26/22 1010 05/26/22 1025  BP: (!) 142/70 (!) 142/70  Pulse: 62 67  Resp: 17 17  Temp:  37.2 C  SpO2: 93% 93%    Last Pain:  Vitals:   05/26/22 1004  TempSrc:   PainSc: [place]

## 2022-05-26 NOTE — Anesthesia Preprocedure Evaluation (Addendum)
Anesthesia Evaluation  Patient identified by MRN, date of birth, ID band Patient awake    Reviewed: Allergy & Precautions, NPO status , Patient's Chart, lab work & pertinent test results  History of Anesthesia Complications (+) Family history of anesthesia reaction  Airway Mallampati: III  TM Distance: >3 FB Neck ROM: Full    Dental no notable dental hx. (+) Dental Advisory Given, Teeth Intact   Pulmonary sleep apnea ,    Pulmonary exam normal breath sounds clear to auscultation       Cardiovascular negative cardio ROS   Rhythm:Regular Rate:Normal     Neuro/Psych  Headaches, PSYCHIATRIC DISORDERS Anxiety Depression    GI/Hepatic negative GI ROS, Neg liver ROS,   Endo/Other  Hypothyroidism Morbid obesity  Renal/GU Renal disease     Musculoskeletal negative musculoskeletal ROS (+)   Abdominal (+) + obese,   Peds  Hematology  (+) Blood dyscrasia, anemia ,   Anesthesia Other Findings   Reproductive/Obstetrics negative OB ROS                            Anesthesia Physical  Anesthesia Plan  ASA: 3  Anesthesia Plan: General   Post-op Pain Management: Tylenol PO (pre-op)* and Gabapentin PO (pre-op)*   Induction: Intravenous  PONV Risk Score and Plan: 4 or greater and Ondansetron, Dexamethasone, Midazolam, Scopolamine patch - Pre-op and Treatment may vary due to age or medical condition  Airway Management Planned: Oral ETT  Additional Equipment: None  Intra-op Plan:   Post-operative Plan: Extubation in OR  Informed Consent: I have reviewed the patients History and Physical, chart, labs and discussed the procedure including the risks, benefits and alternatives for the proposed anesthesia with the patient or authorized representative who has indicated his/her understanding and acceptance.     Dental advisory given  Plan Discussed with: CRNA  Anesthesia Plan Comments:         Anesthesia Quick Evaluation

## 2022-05-26 NOTE — Discharge Summary (Signed)
Patient ID: Madison Tapia [id number] June 25, 1971 51 y.o.  Admit date: 05/25/2022 Discharge date: 05/26/2022  Admitting Diagnosis: cholecystitis  Discharge Diagnosis Patient Active Problem List   Diagnosis Date Noted   Acute cholecystitis 05/25/2022   Hyperlipidemia 07/09/2021   OSA (obstructive sleep apnea) 02/01/2020   Post-operative state 05/24/2019   Vitamin B12 deficiency 09/01/2018   Vitamin D deficiency 09/01/2018   Iron deficiency anemia 09/01/2018   Morbid obesity ([place]) 05/03/2014   HEADACHE 10/08/2007   Hypothyroidism 07/14/2007   Attention deficit disorder 07/14/2007   Anxiety state 06/24/2007   Depression, recurrent ([place]) 06/24/2007   COMMON MIGRAINE 06/24/2007   MALAISE AND FATIGUE 06/24/2007   IRRITABLE BOWEL SYNDROME, HX OF 06/24/2007   NEPHROLITHIASIS, HX OF 06/24/2007   HX, URINARY INFECTION 06/24/2007    Consultants none  Reason for Admission: Madison Tapia is an 51 y.o. female who is here for abdominal pain.  She think she has had pain like this before.  It is located under her right rib.  It became very very severe.  The pain might of been around a little bit yesterday, but this morning she woke up with severe right upper quadrant abdominal pain right underneath the rib.  It was associate with nausea and vomiting.  It was so bad she went to [facility] and was evaluated, then transferred to [facility] for admission of the surgery service.  Procedures Lap chole, Dr. Bobbye Morton 8/28  Hospital Course:  The patient was admitted and underwent a laparoscopic cholecystectomy.  The patient tolerated the procedure well.  She was discharged home from the PACU in stable condition.   Allergies as of 05/26/2022       Reactions   Sulfa Antibiotics Hives   Sulfonamide Derivatives Hives        Medication List     TAKE these medications    acetaminophen 500 MG tablet Commonly known as: TYLENOL Take 2 tablets (1,000 mg total) by mouth every  6 (six) hours as needed.   amphetamine-dextroamphetamine 20 MG tablet Commonly known as: Adderall Take 1 tablet (20 mg total) by mouth daily.   amphetamine-dextroamphetamine 20 MG tablet Commonly known as: Adderall Take 1 tablet (20 mg total) by mouth daily.   amphetamine-dextroamphetamine 20 MG tablet Commonly known as: Adderall Take 1 tablet (20 mg total) by mouth daily.   docusate sodium 100 MG capsule Commonly known as: Colace Take 1 capsule (100 mg total) by mouth 2 (two) times daily.   escitalopram 10 MG tablet Commonly known as: Lexapro Take 1 tablet (10 mg total) by mouth daily.   fluticasone 50 MCG/ACT nasal spray Commonly known as: FLONASE Place 2 sprays into both nostrils daily.   ibuprofen 200 MG tablet Commonly known as: ADVIL Take 200 mg by mouth every 6 (six) hours as needed for headache or moderate pain. What changed: Another medication with the same name was added. Make sure you understand how and when to take each.   ibuprofen 600 MG tablet Commonly known as: ADVIL Take 1 tablet (600 mg total) by mouth every 6 (six) hours as needed. What changed: You were already taking a medication with the same name, and this prescription was added. Make sure you understand how and when to take each.   levothyroxine 100 MCG tablet Commonly known as: SYNTHROID Take 1 tablet by mouth once daily What changed: when to take this   levothyroxine 88 MCG tablet Commonly known as: SYNTHROID Take 1 tablet by mouth once daily What  changed: when to take this   methocarbamol 750 MG tablet Commonly known as: Robaxin-750 Take 1 tablet (750 mg total) by mouth 4 (four) times daily.   MULTIVITAL PO Take by mouth.   oxyCODONE 5 MG immediate release tablet Commonly known as: Roxicodone Take 1 tablet (5 mg total) by mouth every 4 (four) hours as needed for severe pain.          Follow-up Information     Maczis, Carlena Hurl, PA-C Follow up in 3 week(s).   Specialty:  General Surgery Why: Arrive 30 minutes prior to your appointment time, Please bring your insurance card and photo ID Contact information: [place] [address] [phone]         Jesusita Oka, MD Follow up in 2 week(s).   Specialty: Surgery Why: For wound re-check Contact information: [place] [address] [phone]                 Signed: Saverio Danker, [facility] Surgery 05/26/2022, 1:27 PM Please see Amion for pager number during day hours 7:00am-4:30pm, 7-11:30am on Weekends

## 2022-05-26 NOTE — Anesthesia Procedure Notes (Addendum)
Procedure Name: Intubation Date/Time: 05/26/2022 8:20 AM  Performed by: Georgia Duff, CRNAPre-anesthesia Checklist: Patient identified, Emergency Drugs available, Suction available and Patient being monitored Patient Re-evaluated:Patient Re-evaluated prior to induction Oxygen Delivery Method: Circle System Utilized Preoxygenation: Pre-oxygenation with 100% oxygen Induction Type: IV induction Ventilation: Mask ventilation without difficulty Laryngoscope Size: Miller and 3 Grade View: Grade I Tube type: Oral Tube size: 7.0 mm Number of attempts: 1 Airway Equipment and Method: Stylet and Oral airway Placement Confirmation: ETT inserted through vocal cords under direct vision, positive ETCO2 and breath sounds checked- equal and bilateral Secured at: 21 cm Tube secured with: Tape Dental Injury: Teeth and Oropharynx as per pre-operative assessment

## 2022-05-26 NOTE — Progress Notes (Signed)
General Surgery Follow Up Note  Subjective:    Overnight Issues:   Objective:  Vital signs for last 24 hours: Temp:  [97.9 F (36.6 C)-98.5 F (36.9 C)] 97.9 F (36.6 C) (08/28 0622) Pulse Rate:  [57-79] 63 (08/28 0622) Resp:  [13-20] 18 (08/28 0622) BP: (110-180)/(63-95) 154/88 (08/28 0622) SpO2:  [97 %-100 %] 97 % (08/28 0622)  Hemodynamic parameters for last 24 hours:    Intake/Output from previous day: 08/27 0701 - 08/28 0700 In: 100 [IV Piggyback:100] Out: -   Intake/Output this shift: No intake/output data recorded.  Vent settings for last 24 hours:    Physical Exam:  Gen: comfortable, no distress Neuro: non-focal exam HEENT: PERRL Neck: supple CV: RRR Pulm: unlabored breathing Abd: soft, NT GU: clear yellow urine Extr: wwp, no edema   Results for orders placed or performed during the hospital encounter of 05/25/22 (from the past 24 hour(s))  CBC with Differential     Status: Abnormal   Collection Time: 05/25/22  3:17 PM  Result Value Ref Range   WBC 13.1 (H) 4.0 - 10.5 K/uL   RBC 5.48 (H) 3.87 - 5.11 MIL/uL   Hemoglobin 14.9 12.0 - 15.0 g/dL   HCT 45.1 36.0 - 46.0 %   MCV 82.3 80.0 - 100.0 fL   MCH 27.2 26.0 - 34.0 pg   MCHC 33.0 30.0 - 36.0 g/dL   RDW 14.5 11.5 - 15.5 %   Platelets 240 150 - 400 K/uL   nRBC 0.0 0.0 - 0.2 %   Neutrophils Relative % 90 %   Neutro Abs 11.9 (H) 1.7 - 7.7 K/uL   Lymphocytes Relative 6 %   Lymphs Abs 0.8 0.7 - 4.0 K/uL   Monocytes Relative 3 %   Monocytes Absolute 0.3 0.1 - 1.0 K/uL   Eosinophils Relative 0 %   Eosinophils Absolute 0.0 0.0 - 0.5 K/uL   Basophils Relative 0 %   Basophils Absolute 0.1 0.0 - 0.1 K/uL   Immature Granulocytes 1 %   Abs Immature Granulocytes 0.07 0.00 - 0.07 K/uL  Comprehensive metabolic panel     Status: Abnormal   Collection Time: 05/25/22  3:17 PM  Result Value Ref Range   Sodium 138 135 - 145 mmol/L   Potassium 3.8 3.5 - 5.1 mmol/L   Chloride 106 98 - 111 mmol/L   CO2 21  (L) 22 - 32 mmol/L   Glucose, Bld 134 (H) 70 - 99 mg/dL   BUN 12 6 - 20 mg/dL   Creatinine, Ser 0.85 0.44 - 1.00 mg/dL   Calcium 8.8 (L) 8.9 - 10.3 mg/dL   Total Protein 7.4 6.5 - 8.1 g/dL   Albumin 4.1 3.5 - 5.0 g/dL   AST 15 15 - 41 U/L   ALT 10 0 - 44 U/L   Alkaline Phosphatase 64 38 - 126 U/L   Total Bilirubin 0.6 0.3 - 1.2 mg/dL   GFR, Estimated >60 >60 mL/min   Anion gap 11 5 - 15  Lipase, blood     Status: None   Collection Time: 05/25/22  3:17 PM  Result Value Ref Range   Lipase 18 11 - 51 U/L  Urinalysis, Routine w reflex microscopic Urine, Clean Catch     Status: Abnormal   Collection Time: 05/25/22  5:40 PM  Result Value Ref Range   Color, Urine YELLOW YELLOW   APPearance CLEAR CLEAR   Specific Gravity, Urine >1.046 (H) 1.005 - 1.030   pH 7.0 5.0 -  8.0   Glucose, UA NEGATIVE NEGATIVE mg/dL   Hgb urine dipstick NEGATIVE NEGATIVE   Bilirubin Urine NEGATIVE NEGATIVE   Ketones, ur NEGATIVE NEGATIVE mg/dL   Protein, ur NEGATIVE NEGATIVE mg/dL   Nitrite NEGATIVE NEGATIVE   Leukocytes,Ua NEGATIVE NEGATIVE  HIV Antibody (routine testing w rflx)     Status: None   Collection Time: 05/25/22 10:46 PM  Result Value Ref Range   HIV Screen 4th Generation wRfx Non Reactive Non Reactive  CBC     Status: Abnormal   Collection Time: 05/25/22 10:46 PM  Result Value Ref Range   WBC 12.0 (H) 4.0 - 10.5 K/uL   RBC 4.92 3.87 - 5.11 MIL/uL   Hemoglobin 13.5 12.0 - 15.0 g/dL   HCT 41.2 36.0 - 46.0 %   MCV 83.7 80.0 - 100.0 fL   MCH 27.4 26.0 - 34.0 pg   MCHC 32.8 30.0 - 36.0 g/dL   RDW 14.6 11.5 - 15.5 %   Platelets 222 150 - 400 K/uL   nRBC 0.0 0.0 - 0.2 %  Creatinine, serum     Status: None   Collection Time: 05/25/22 10:46 PM  Result Value Ref Range   Creatinine, Ser 0.82 0.44 - 1.00 mg/dL   GFR, Estimated >60 >60 mL/min  Hepatic function panel     Status: Abnormal   Collection Time: 05/26/22  4:38 AM  Result Value Ref Range   Total Protein 5.9 (L) 6.5 - 8.1 g/dL    Albumin 3.1 (L) 3.5 - 5.0 g/dL   AST 17 15 - 41 U/L   ALT 14 0 - 44 U/L   Alkaline Phosphatase 49 38 - 126 U/L   Total Bilirubin 0.7 0.3 - 1.2 mg/dL   Bilirubin, Direct 0.2 0.0 - 0.2 mg/dL   Indirect Bilirubin 0.5 0.3 - 0.9 mg/dL  Basic metabolic panel     Status: Abnormal   Collection Time: 05/26/22  4:38 AM  Result Value Ref Range   Sodium 137 135 - 145 mmol/L   Potassium 3.6 3.5 - 5.1 mmol/L   Chloride 112 (H) 98 - 111 mmol/L   CO2 21 (L) 22 - 32 mmol/L   Glucose, Bld 91 70 - 99 mg/dL   BUN 10 6 - 20 mg/dL   Creatinine, Ser 0.85 0.44 - 1.00 mg/dL   Calcium 8.0 (L) 8.9 - 10.3 mg/dL   GFR, Estimated >60 >60 mL/min   Anion gap 4 (L) 5 - 15  CBC     Status: None   Collection Time: 05/26/22  4:38 AM  Result Value Ref Range   WBC 8.7 4.0 - 10.5 K/uL   RBC 4.64 3.87 - 5.11 MIL/uL   Hemoglobin 12.9 12.0 - 15.0 g/dL   HCT 40.0 36.0 - 46.0 %   MCV 86.2 80.0 - 100.0 fL   MCH 27.8 26.0 - 34.0 pg   MCHC 32.3 30.0 - 36.0 g/dL   RDW 14.7 11.5 - 15.5 %   Platelets 209 150 - 400 K/uL   nRBC 0.0 0.0 - 0.2 %    Assessment & Plan:  Present on Admission:  Acute cholecystitis    LOS: 1 day   Additional comments:I reviewed the patient's new clinical lab test results.   and I reviewed the patients new imaging test results.    Acute cholecystitis - WBC normalized after initiation of abx, plan for lap chole today. Informed consent was obtained after detailed explanation of risks, including bleeding, infection, biloma, hematoma, injury to common bile  duct, need for IOC to delineate anatomy, and need for conversion to open procedure. History of open hyst 04/2019 2/2 "not tolerating positioning". We discussed the possibilities of needing to convert to open due to non-tolerance of insufflation and/or non-tolerance of steep reverse Trendelenburg. Patient verbalizes understanding. All questions answered to the patient's satisfaction.  FEN - NPO DVT - SCDs, LMWH Dispo - medsurg    Jesusita Oka, MD Trauma & General Surgery Please use [email or web address] to contact on call provider  05/26/2022  *Care during the described time interval was provided by me. I have reviewed this patient's available data, including medical history, events of note, physical examination and test results as part of my evaluation.

## 2022-05-27 ENCOUNTER — Telehealth (INDEPENDENT_AMBULATORY_CARE_PROVIDER_SITE_OTHER): Payer: 59 | Admitting: Internal Medicine

## 2022-05-27 ENCOUNTER — Encounter (HOSPITAL_COMMUNITY): Payer: Self-pay | Admitting: Surgery

## 2022-05-27 VITALS — Wt 275.0 lb

## 2022-05-27 DIAGNOSIS — E039 Hypothyroidism, unspecified: Secondary | ICD-10-CM | POA: Diagnosis not present

## 2022-05-27 DIAGNOSIS — F900 Attention-deficit hyperactivity disorder, predominantly inattentive type: Secondary | ICD-10-CM

## 2022-05-27 LAB — SURGICAL PATHOLOGY

## 2022-05-27 MED ORDER — AMPHETAMINE-DEXTROAMPHETAMINE 20 MG PO TABS: 20.0000 mg | ORAL_TABLET | Freq: Every day | ORAL | 0 refills | Status: DC

## 2022-05-27 MED ORDER — LEVOTHYROXINE SODIUM 100 MCG PO TABS: 100.0000 ug | ORAL_TABLET | Freq: Every day | ORAL | 0 refills | Status: DC

## 2022-05-27 NOTE — Progress Notes (Signed)
Virtual Visit via Video Note  I connected with Madison Tapia on 05/27/22 at 10:00 AM EDT by a video enabled telemedicine application and verified that I am speaking with the correct person using two identifiers.  Location patient: home Location provider: work office Persons participating in the virtual visit: patient, provider  I discussed the limitations of evaluation and management by telemedicine and the availability of in person appointments. The patient expressed understanding and agreed to proceed.   HPI: She has scheduled this visit for the purpose of medication refills per contract.  She has a history of ADHD and is on Adderall 20 mg of which she takes 1 tablet daily.  Yesterday she had a laparoscopic cholecystectomy for a bout of cholecystitis for which she had to go to the emergency room over the weekend.  She was prescribed oxycodone but has not taken a lot of this as it causes nausea.  She is mainly taking a muscle relaxer and ibuprofen.   ROS: Constitutional: Denies fever, chills, diaphoresis, appetite change and fatigue.  HEENT: Denies photophobia, eye pain, redness, hearing loss, ear pain, congestion, sore throat, rhinorrhea, sneezing, mouth sores, trouble swallowing, neck pain, neck stiffness and tinnitus.   Respiratory: Denies SOB, DOE, cough, chest tightness,  and wheezing.   Cardiovascular: Denies chest pain, palpitations and leg swelling.  Gastrointestinal: Denies nausea, vomiting, abdominal pain, diarrhea, constipation, blood in stool and abdominal distention.  Genitourinary: Denies dysuria, urgency, frequency, hematuria, flank pain and difficulty urinating.  Endocrine: Denies: hot or cold intolerance, sweats, changes in hair or nails, polyuria, polydipsia. Musculoskeletal: Denies myalgias, back pain, joint swelling, arthralgias and gait problem.  Skin: Denies pallor, rash and wound.  Neurological: Denies dizziness, seizures, syncope, weakness,  light-headedness, numbness and headaches.  Hematological: Denies adenopathy. Easy bruising, personal or family bleeding history  Psychiatric/Behavioral: Denies suicidal ideation, mood changes, confusion, nervousness, sleep disturbance and agitation   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/14/2007   Anemia 2020   ANXIETY 06/24/2007   Cancer ([place])    topical skin melanoma   Chronic kidney disease    kidney stones   COMMON MIGRAINE 06/24/2007   DEPRESSION 06/24/2007   Family history of adverse reaction to anesthesia    family -extreme nausea, hallucinations (mother)   Headache(784.0) 10/08/2007   HX, URINARY INFECTION 06/24/2007   IRRITABLE BOWEL SYNDROME, HX OF 06/24/2007   MALAISE AND FATIGUE 06/24/2007   NEPHROLITHIASIS, HX OF 06/24/2007   Sleep apnea    no cpap   Unspecified hypothyroidism 07/14/2007    Past Surgical History:  Procedure Laterality Date   CHOLECYSTECTOMY N/A 05/26/2022   Procedure: LAPAROSCOPIC CHOLECYSTECTOMY;  Surgeon: Jesusita Oka, MD;  Location: [facility];  Service: General;  Laterality: N/A;   KIDNEY STONE SURGERY     basket extraction-age 33   melanoma surgery  2018   from back   ROBOTIC ASSISTED LAPAROSCOPIC HYSTERECTOMY AND SALPINGECTOMY Bilateral 05/24/2019   Procedure: ATTEMPTED XI ROBOTIC ASSISTED TOTAL LAPAROSCOPIC HYSTERECTOMY AND SALPINGECTOMY, CONVERTED TO OPEN  PROEDURE: TOTAL  ABDOMINAL HYSTERECTOMY WITH BILATERAL SALPINGECTOMY;  Surgeon: Princess Bruins, MD;  Location: [place];  Service: Gynecology;  Laterality: Bilateral;  request 7:30am OR time in [place] block requests two hours Will contact Chassity re: RNFA t    Family History  Problem Relation Age of Onset   Thyroid disease Mother    Breast cancer Mother 65   Diabetes Father    Hypertension Father    Mental illness Father  Depression Neg Hx        family hx   Colon cancer Neg Hx    Esophageal cancer Neg Hx    Rectal cancer Neg Hx    Stomach  cancer Neg Hx     SOCIAL HX:   reports that she has never smoked. She has never used smokeless tobacco. She reports current alcohol use. She reports that she does not use drugs.   Current Outpatient Medications:    acetaminophen (TYLENOL) 500 MG tablet, Take 2 tablets (1,000 mg total) by mouth every 6 (six) hours as needed., Disp: 100 tablet, Rfl: 2   docusate sodium (COLACE) 100 MG capsule, Take 1 capsule (100 mg total) by mouth 2 (two) times daily., Disp: 60 capsule, Rfl: 2   escitalopram (LEXAPRO) 10 MG tablet, Take 1 tablet (10 mg total) by mouth daily., Disp: 90 tablet, Rfl: 1   fluticasone (FLONASE) 50 MCG/ACT nasal spray, Place 2 sprays into both nostrils daily., Disp: 16 g, Rfl: 6   ibuprofen (ADVIL) 200 MG tablet, Take 200 mg by mouth every 6 (six) hours as needed for headache or moderate pain., Disp: , Rfl:    ibuprofen (ADVIL) 600 MG tablet, Take 1 tablet (600 mg total) by mouth every 6 (six) hours as needed., Disp: 120 tablet, Rfl: 1   levothyroxine (SYNTHROID) 88 MCG tablet, Take 1 tablet by mouth once daily (Patient taking differently: Take 88 mcg by mouth daily before breakfast.), Disp: 90 tablet, Rfl: 1   Multiple Vitamins-Minerals (MULTIVITAL PO), Take by mouth., Disp: , Rfl:    oxyCODONE (ROXICODONE) 5 MG immediate release tablet, Take 1 tablet (5 mg total) by mouth every 4 (four) hours as needed for severe pain., Disp: 30 tablet, Rfl: 0   amphetamine-dextroamphetamine (ADDERALL) 20 MG tablet, Take 1 tablet (20 mg total) by mouth daily., Disp: 30 tablet, Rfl: 0   amphetamine-dextroamphetamine (ADDERALL) 20 MG tablet, Take 1 tablet (20 mg total) by mouth daily., Disp: 30 tablet, Rfl: 0   amphetamine-dextroamphetamine (ADDERALL) 20 MG tablet, Take 1 tablet (20 mg total) by mouth daily., Disp: 30 tablet, Rfl: 0   levothyroxine (SYNTHROID) 100 MCG tablet, Take 1 tablet (100 mcg total) by mouth daily., Disp: 90 tablet, Rfl: 0   methocarbamol (ROBAXIN-750) 750 MG tablet, Take 1  tablet (750 mg total) by mouth 4 (four) times daily., Disp: 120 tablet, Rfl: 1  EXAM:   VITALS per patient if applicable: None reported  GENERAL: alert, oriented, appears well and in no acute distress  HEENT: atraumatic, conjunttiva clear, no obvious abnormalities on inspection of external nose and ears  NECK: normal movements of the head and neck  LUNGS: on inspection no signs of respiratory distress, breathing rate appears normal, no obvious gross increased work of breathing, gasping or wheezing  CV: no obvious cyanosis  MS: moves all visible extremities without noticeable abnormality  PSYCH/NEURO: pleasant and cooperative, no obvious depression or anxiety, speech and thought processing grossly intact  ASSESSMENT AND PLAN:   Acquired hypothyroidism - Plan: levothyroxine (SYNTHROID) 100 MCG tablet  Attention deficit hyperactivity disorder (ADHD), predominantly inattentive type  -PDMP reviewed, 1 red flag for MME due to oxycodone prescribed after surgery, ORS has increased to 290 for the same. -Refill Adderall 20 mg to take 1 tablet daily for total of 30 tablets a month x3 months.    I discussed the assessment and treatment plan with the patient. The patient was provided an opportunity to ask questions and all were answered. The patient agreed with  the plan and demonstrated an understanding of the instructions.   The patient was advised to call back or seek an in-person evaluation if the symptoms worsen or if the condition fails to improve as anticipated.    Lelon Frohlich, MD  [facility]

## 2022-07-09 ENCOUNTER — Other Ambulatory Visit: Payer: Self-pay | Admitting: Internal Medicine

## 2022-07-09 DIAGNOSIS — F900 Attention-deficit hyperactivity disorder, predominantly inattentive type: Secondary | ICD-10-CM

## 2022-07-10 NOTE — Telephone Encounter (Signed)
Spoke with [facility] and refill is ready for pickup. Patient aware

## 2022-07-27 ENCOUNTER — Other Ambulatory Visit: Payer: Self-pay | Admitting: Internal Medicine

## 2022-07-27 DIAGNOSIS — E039 Hypothyroidism, unspecified: Secondary | ICD-10-CM

## 2022-08-12 ENCOUNTER — Telehealth: Payer: Self-pay | Admitting: Internal Medicine

## 2022-08-12 DIAGNOSIS — F900 Attention-deficit hyperactivity disorder, predominantly inattentive type: Secondary | ICD-10-CM

## 2022-08-12 NOTE — Telephone Encounter (Signed)
Please have the patient to call around to find out where we can send the refill. thanks

## 2022-08-12 NOTE — Telephone Encounter (Signed)
Pt is calling and her normal pharm does not have   Disp Refills Start End   amphetamine-dextroamphetamine (ADDERALL) 20 MG tablet        [id number] [phone] Fax: [phone]

## 2022-08-14 MED ORDER — AMPHETAMINE-DEXTROAMPHETAMINE 20 MG PO TABS: 20.0000 mg | ORAL_TABLET | Freq: Every day | ORAL | 0 refills | Status: DC

## 2022-08-14 NOTE — Telephone Encounter (Signed)
Please send to [id number]: [phone] Fax: [phone]

## 2022-11-18 ENCOUNTER — Other Ambulatory Visit: Payer: Self-pay | Admitting: Internal Medicine

## 2022-11-18 DIAGNOSIS — E039 Hypothyroidism, unspecified: Secondary | ICD-10-CM

## 2023-01-02 ENCOUNTER — Other Ambulatory Visit: Payer: Self-pay | Admitting: Internal Medicine

## 2023-01-02 DIAGNOSIS — E039 Hypothyroidism, unspecified: Secondary | ICD-10-CM

## 2023-01-18 ENCOUNTER — Other Ambulatory Visit: Payer: Self-pay | Admitting: Internal Medicine

## 2023-01-18 DIAGNOSIS — F339 Major depressive disorder, recurrent, unspecified: Secondary | ICD-10-CM

## 2023-02-19 ENCOUNTER — Other Ambulatory Visit: Payer: Self-pay | Admitting: Internal Medicine

## 2023-02-19 DIAGNOSIS — F339 Major depressive disorder, recurrent, unspecified: Secondary | ICD-10-CM

## 2023-03-27 ENCOUNTER — Other Ambulatory Visit: Payer: Self-pay | Admitting: Internal Medicine

## 2023-05-20 ENCOUNTER — Other Ambulatory Visit: Payer: Self-pay | Admitting: Internal Medicine

## 2023-05-20 DIAGNOSIS — F339 Major depressive disorder, recurrent, unspecified: Secondary | ICD-10-CM

## 2023-06-19 ENCOUNTER — Other Ambulatory Visit: Payer: Self-pay | Admitting: Internal Medicine

## 2023-06-19 DIAGNOSIS — F339 Major depressive disorder, recurrent, unspecified: Secondary | ICD-10-CM

## 2023-06-28 ENCOUNTER — Other Ambulatory Visit: Payer: Self-pay | Admitting: Internal Medicine

## 2023-11-09 ENCOUNTER — Encounter: Payer: Self-pay | Admitting: Internal Medicine

## 2023-11-09 ENCOUNTER — Ambulatory Visit (INDEPENDENT_AMBULATORY_CARE_PROVIDER_SITE_OTHER): Payer: Medicaid Other | Admitting: Internal Medicine

## 2023-11-09 VITALS — BP 120/84 | HR 88 | Temp 98.1°F | Ht 65.5 in | Wt 290.7 lb

## 2023-11-09 DIAGNOSIS — E039 Hypothyroidism, unspecified: Secondary | ICD-10-CM

## 2023-11-09 DIAGNOSIS — E538 Deficiency of other specified B group vitamins: Secondary | ICD-10-CM | POA: Diagnosis not present

## 2023-11-09 DIAGNOSIS — E782 Mixed hyperlipidemia: Secondary | ICD-10-CM | POA: Diagnosis not present

## 2023-11-09 DIAGNOSIS — Z23 Encounter for immunization: Secondary | ICD-10-CM | POA: Diagnosis not present

## 2023-11-09 DIAGNOSIS — Z0000 Encounter for general adult medical examination without abnormal findings: Secondary | ICD-10-CM | POA: Diagnosis not present

## 2023-11-09 DIAGNOSIS — F900 Attention-deficit hyperactivity disorder, predominantly inattentive type: Secondary | ICD-10-CM

## 2023-11-09 DIAGNOSIS — G4733 Obstructive sleep apnea (adult) (pediatric): Secondary | ICD-10-CM

## 2023-11-09 DIAGNOSIS — Z1159 Encounter for screening for other viral diseases: Secondary | ICD-10-CM

## 2023-11-09 DIAGNOSIS — E559 Vitamin D deficiency, unspecified: Secondary | ICD-10-CM

## 2023-11-09 DIAGNOSIS — F339 Major depressive disorder, recurrent, unspecified: Secondary | ICD-10-CM

## 2023-11-09 DIAGNOSIS — D500 Iron deficiency anemia secondary to blood loss (chronic): Secondary | ICD-10-CM

## 2023-11-09 DIAGNOSIS — Z1231 Encounter for screening mammogram for malignant neoplasm of breast: Secondary | ICD-10-CM

## 2023-11-09 LAB — CBC WITH DIFFERENTIAL/PLATELET
Basophils Absolute: 0.1 10*3/uL (ref 0.0–0.1)
Basophils Relative: 0.6 % (ref 0.0–3.0)
Eosinophils Absolute: 0.2 10*3/uL (ref 0.0–0.7)
Eosinophils Relative: 1.7 % (ref 0.0–5.0)
HCT: 44.8 % (ref 36.0–46.0)
Hemoglobin: 14.9 g/dL (ref 12.0–15.0)
Lymphocytes Relative: 24.9 % (ref 12.0–46.0)
Lymphs Abs: 2.4 10*3/uL (ref 0.7–4.0)
MCHC: 33.4 g/dL (ref 30.0–36.0)
MCV: 85.5 fL (ref 78.0–100.0)
Monocytes Absolute: 0.7 10*3/uL (ref 0.1–1.0)
Monocytes Relative: 6.9 % (ref 3.0–12.0)
Neutro Abs: 6.2 10*3/uL (ref 1.4–7.7)
Neutrophils Relative %: 65.9 % (ref 43.0–77.0)
Platelets: 245 10*3/uL (ref 150.0–400.0)
RBC: 5.24 Mil/uL — ABNORMAL HIGH (ref 3.87–5.11)
RDW: 14.8 % (ref 11.5–15.5)
WBC: 9.5 10*3/uL (ref 4.0–10.5)

## 2023-11-09 LAB — LIPID PANEL
Cholesterol: 247 mg/dL — ABNORMAL HIGH (ref 0–200)
HDL: 63.6 mg/dL (ref >39.00)
LDL Cholesterol: 115 mg/dL — ABNORMAL HIGH (ref 0–99)
NonHDL: 183.59
Total CHOL/HDL Ratio: 4
Triglycerides: 345 mg/dL — ABNORMAL HIGH (ref 0.0–149.0)
VLDL: 69 mg/dL — ABNORMAL HIGH (ref 0.0–40.0)

## 2023-11-09 LAB — COMPREHENSIVE METABOLIC PANEL
ALT: 17 U/L (ref 0–35)
AST: 19 U/L (ref 0–37)
Albumin: 4.2 g/dL (ref 3.5–5.2)
Alkaline Phosphatase: 53 U/L (ref 39–117)
BUN: 15 mg/dL (ref 6–23)
CO2: 26 meq/L (ref 19–32)
Calcium: 9.3 mg/dL (ref 8.4–10.5)
Chloride: 100 meq/L (ref 96–112)
Creatinine, Ser: 0.9 mg/dL (ref 0.40–1.20)
GFR: 73.64 mL/min (ref >60.00)
Glucose, Bld: 78 mg/dL (ref 70–99)
Potassium: 3 meq/L — ABNORMAL LOW (ref 3.5–5.1)
Sodium: 138 meq/L (ref 135–145)
Total Bilirubin: 0.9 mg/dL (ref 0.2–1.2)
Total Protein: 7.5 g/dL (ref 6.0–8.3)

## 2023-11-09 LAB — VITAMIN B12: Vitamin B-12: 93 pg/mL — ABNORMAL LOW (ref 211–911)

## 2023-11-09 LAB — VITAMIN D 25 HYDROXY (VIT D DEFICIENCY, FRACTURES): VITD: 58.48 ng/mL (ref 30.00–100.00)

## 2023-11-09 LAB — TSH: TSH: 42.74 u[IU]/mL — ABNORMAL HIGH (ref 0.35–5.50)

## 2023-11-09 NOTE — Assessment & Plan Note (Signed)
 Not currently on Adderall.

## 2023-11-09 NOTE — Assessment & Plan Note (Signed)
 Discussed healthy lifestyle, including increased physical activity and better food choices to promote weight loss.

## 2023-11-09 NOTE — Assessment & Plan Note (Signed)
 Check levels today.

## 2023-11-09 NOTE — Assessment & Plan Note (Signed)
 Check thyroid  levels today.  Has been off levothyroxine  for over 3 months.

## 2023-11-09 NOTE — Progress Notes (Signed)
 Established Patient Office Visit     CC/Reason for Visit: Annual preventive exam and yearly follow-up chronic conditions  HPI: Madison Tapia is a 53 y.o. female who is coming in today for the above mentioned reasons. Past Medical History is significant for: ADHD, depression, anxiety, morbid obesity, hypothyroidism, vitamin D  and B12 deficiencies.  She has been off thyroid  and ADHD medication for well over 3 months.  She has now quit work.  She is otherwise feeling well.  She feels her mental state is in a good place.   Past Medical/Surgical History: 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/14/2007   Anemia 2020   ANXIETY 06/24/2007   Cancer ([facility])    topical skin melanoma   Chronic kidney disease    kidney stones   COMMON MIGRAINE 06/24/2007   DEPRESSION 06/24/2007   Family history of adverse reaction to anesthesia    family -extreme nausea, hallucinations (mother)   Headache(784.0) 10/08/2007   HX, URINARY INFECTION 06/24/2007   IRRITABLE BOWEL SYNDROME, HX OF 06/24/2007   MALAISE AND FATIGUE 06/24/2007   NEPHROLITHIASIS, HX OF 06/24/2007   Sleep apnea    no cpap   Unspecified hypothyroidism 07/14/2007    Past Surgical History:  Procedure Laterality Date   CHOLECYSTECTOMY N/A 05/26/2022   Procedure: LAPAROSCOPIC CHOLECYSTECTOMY;  Surgeon: Anda Bamberg, MD;  Location: [facility];  Service: General;  Laterality: N/A;   KIDNEY STONE SURGERY     basket extraction-age 71   melanoma surgery  2018   from back   ROBOTIC ASSISTED LAPAROSCOPIC HYSTERECTOMY AND SALPINGECTOMY Bilateral 05/24/2019   Procedure: ATTEMPTED XI ROBOTIC ASSISTED TOTAL LAPAROSCOPIC HYSTERECTOMY AND SALPINGECTOMY, CONVERTED TO OPEN  PROEDURE: TOTAL  ABDOMINAL HYSTERECTOMY WITH BILATERAL SALPINGECTOMY;  Surgeon: Lavoie, Marie-Lyne, MD;  Location: [facility];  Service: Gynecology;  Laterality: Bilateral;  request 7:30am OR time in [place] [facility] block requests two hours Will contact  Chassity re: RNFA t    Social History:  reports that she has never smoked. She has never used smokeless tobacco. She reports current alcohol use. She reports that she does not use drugs.  Allergies: Allergies  Allergen Reactions   Sulfa Antibiotics Hives   Sulfonamide Derivatives Hives    Family History:  Family History  Problem Relation Age of Onset   Thyroid  disease Mother    Breast cancer Mother 54   Diabetes Father    Hypertension Father    Mental illness Father    Depression Neg Hx        family hx   Colon cancer Neg Hx    Esophageal cancer Neg Hx    Rectal cancer Neg Hx    Stomach cancer Neg Hx      Current Outpatient Medications:    amphetamine -dextroamphetamine  (ADDERALL) 20 MG tablet, Take 1 tablet (20 mg total) by mouth daily., Disp: 30 tablet, Rfl: 0   amphetamine -dextroamphetamine  (ADDERALL) 20 MG tablet, Take 1 tablet (20 mg total) by mouth daily., Disp: 30 tablet, Rfl: 0   amphetamine -dextroamphetamine  (ADDERALL) 20 MG tablet, Take 1 tablet (20 mg total) by mouth daily., Disp: 30 tablet, Rfl: 0   fluticasone  (FLONASE ) 50 MCG/ACT nasal spray, Place 2 sprays into both nostrils daily., Disp: 16 g, Rfl: 6   ibuprofen  (ADVIL ) 200 MG tablet, Take 200 mg by mouth every 6 (six) hours as needed for headache or moderate pain., Disp: , Rfl:    Multiple Vitamins-Minerals (MULTIVITAL PO), Take by mouth., Disp: , Rfl:  escitalopram  (LEXAPRO ) 10 MG tablet, TAKE 1 TABLET BY MOUTH ONCE DAILY . APPOINTMENT REQUIRED FOR FUTURE REFILLS (Patient not taking: Reported on 11/09/2023), Disp: 15 tablet, Rfl: 0   levothyroxine  (SYNTHROID ) 100 MCG tablet, Take 1 tablet by mouth once daily (Patient not taking: Reported on 11/09/2023), Disp: 90 tablet, Rfl: 0   levothyroxine  (SYNTHROID ) 88 MCG tablet, Take 1 tablet by mouth once daily (Patient not taking: Reported on 11/09/2023), Disp: 30 tablet, Rfl: 0  Review of Systems:  Negative unless indicated in HPI.   Physical Exam: Vitals:    11/09/23 1300  BP: 120/84  Pulse: 88  Temp: 98.1 F (36.7 C)  TempSrc: Oral  SpO2: 97%  Weight: 290 lb 11.2 oz (131.9 kg)  Height: 5' 5.5" (1.664 m)    Body mass index is 47.64 kg/m.   Physical Exam Vitals reviewed.  Constitutional:      General: She is not in acute distress.    Appearance: Normal appearance. She is obese. She is not ill-appearing, toxic-appearing or diaphoretic.  HENT:     Head: Normocephalic.     Right Ear: Tympanic membrane, ear canal and external ear normal. There is no impacted cerumen.     Left Ear: Tympanic membrane, ear canal and external ear normal. There is no impacted cerumen.     Nose: Nose normal.     Mouth/Throat:     Mouth: Mucous membranes are moist.     Pharynx: Oropharynx is clear. No oropharyngeal exudate or posterior oropharyngeal erythema.  Eyes:     General: No scleral icterus.       Right eye: No discharge.        Left eye: No discharge.     Conjunctiva/sclera: Conjunctivae normal.     Pupils: Pupils are equal, round, and reactive to light.  Neck:     Vascular: No carotid bruit.  Cardiovascular:     Rate and Rhythm: Normal rate and regular rhythm.     Pulses: Normal pulses.     Heart sounds: Normal heart sounds.  Pulmonary:     Effort: Pulmonary effort is normal. No respiratory distress.     Breath sounds: Normal breath sounds.  Abdominal:     General: Abdomen is flat. Bowel sounds are normal.     Palpations: Abdomen is soft.  Musculoskeletal:        General: Normal range of motion.     Cervical back: Normal range of motion.  Skin:    General: Skin is warm and dry.  Neurological:     General: No focal deficit present.     Mental Status: She is alert and oriented to person, place, and time. Mental status is at baseline.  Psychiatric:        Mood and Affect: Mood normal.        Behavior: Behavior normal.        Thought Content: Thought content normal.        Judgment: Judgment normal.     Flowsheet Row Office Visit  from 11/09/2023 in [facility] [place]  PHQ-9 Total Score 5       Impression and Plan:  Encounter for preventive health examination  Encounter for screening mammogram for malignant neoplasm of breast -     Digital Screening Mammogram, Left and Right; Future  Vitamin D  deficiency Assessment & Plan: Check levels today.  Orders: -     VITAMIN D  25 Hydroxy (Vit-D Deficiency, Fractures); Future  Vitamin B12 deficiency Assessment & Plan: Check  levels today.  Orders: -     Vitamin B12; Future  Morbid obesity (HCC) Assessment & Plan: -Discussed healthy lifestyle, including increased physical activity and better food choices to promote weight loss.    Mixed hyperlipidemia Assessment & Plan: Check lipids today.  Orders: -     CBC with Differential/Platelet; Future -     Comprehensive metabolic panel; Future -     Lipid panel; Future  Iron deficiency anemia due to chronic blood loss Assessment & Plan: Check levels today.   Acquired hypothyroidism Assessment & Plan: Check thyroid  levels today.  Has been off levothyroxine  for over 3 months.  Orders: -     TSH; Future  OSA (obstructive sleep apnea) Assessment & Plan: Continued CPAP recommended.   Depression, recurrent (HCC)  Immunization due  Encounter for hepatitis C screening test for low risk patient -     Hepatitis C antibody; Future  Attention deficit hyperactivity disorder (ADHD), predominantly inattentive type Assessment & Plan: Not currently on Adderall.   -Recommend routine eye and dental care. -Healthy lifestyle discussed in detail. -Labs to be updated today. -Prostate cancer screening: N/A Health Maintenance  Topic Date Due   Hepatitis C Screening  Never done   Zoster (Shingles) Vaccine (1 of 2) Never done   Pap with HPV screening  08/12/2019   Mammogram  06/04/2022   Flu Shot  04/30/2023   COVID-19 Vaccine (4 - 2024-25 season) 05/31/2023   DTaP/Tdap/Td vaccine (2  - Td or Tdap) 05/03/2024   Colon Cancer Screening  12/22/2027   HIV Screening  Completed   HPV Vaccine  Aged Out      -Flu and shingles vaccine today. -Mammogram requested. -She has had a complete hysterectomy so no longer needs cervical cancer screening.    Marguerita Shih, MD [facility]

## 2023-11-09 NOTE — Addendum Note (Signed)
 Addended by: Nicolina Barrios B on: 11/09/2023 02:32 PM   Modules accepted: Orders

## 2023-11-09 NOTE — Assessment & Plan Note (Signed)
 Continued CPAP recommended.

## 2023-11-09 NOTE — Assessment & Plan Note (Signed)
 Check lipids today

## 2023-11-10 LAB — HEPATITIS C ANTIBODY: Hepatitis C Ab: NONREACTIVE

## 2023-11-16 ENCOUNTER — Encounter: Payer: Self-pay | Admitting: Internal Medicine

## 2023-11-19 ENCOUNTER — Telehealth: Payer: Self-pay | Admitting: Internal Medicine

## 2023-11-19 NOTE — Telephone Encounter (Signed)
 Pt has viewed her lab results on mychart and has concerns

## 2023-11-20 ENCOUNTER — Other Ambulatory Visit: Payer: Self-pay | Admitting: Internal Medicine

## 2023-11-20 NOTE — Telephone Encounter (Signed)
 Patient is calling back in for the third time regarding her results and medication she needs a call back today she is upset about this

## 2023-11-20 NOTE — Telephone Encounter (Unsigned)
 Copied from CRM [id number]. Topic: Clinical - Medication Refill >> Nov 20, 2023  5:04 PM Alcus Dad wrote: Most Recent Primary Care Visit:  Provider: Henderson Cloud  Department: [facility]  Visit Type: PHYSICAL/AWV  Date: 11/09/2023  Medication: levothyroxine (SYNTHROID) 100 MCG tablet  Has the patient contacted their pharmacy? No (Agent: If no, request that the patient contact the pharmacy for the refill. If patient does not wish to contact the pharmacy document the reason why and proceed with request.) (Agent: If yes, when and what did the pharmacy advise?)  Is this the correct pharmacy for this prescription? Yes If no, delete pharmacy and type the correct one.  This is the patient's preferred pharmacy:  [facility] [address] [place] - [address] [place] [address] Phone: [phone] Fax: [phone]    Has the prescription been filled recently? No  Is the patient out of the medication? Yes  Has the patient been seen for an appointment in the last year OR does the patient have an upcoming appointment? Yes  Can we respond through MyChart? Yes  Agent: Please be advised that Rx refills may take up to 3 business days. We ask that you follow-up with your pharmacy.

## 2023-11-23 ENCOUNTER — Telehealth: Payer: Self-pay

## 2023-11-23 ENCOUNTER — Other Ambulatory Visit: Payer: Self-pay | Admitting: Internal Medicine

## 2023-11-23 ENCOUNTER — Encounter: Payer: Self-pay | Admitting: Internal Medicine

## 2023-11-23 DIAGNOSIS — E538 Deficiency of other specified B group vitamins: Secondary | ICD-10-CM

## 2023-11-23 DIAGNOSIS — E039 Hypothyroidism, unspecified: Secondary | ICD-10-CM

## 2023-11-23 DIAGNOSIS — E876 Hypokalemia: Secondary | ICD-10-CM

## 2023-11-23 DIAGNOSIS — E559 Vitamin D deficiency, unspecified: Secondary | ICD-10-CM

## 2023-11-23 DIAGNOSIS — E782 Mixed hyperlipidemia: Secondary | ICD-10-CM

## 2023-11-23 MED ORDER — LEVOTHYROXINE SODIUM 88 MCG PO TABS: 88.0000 ug | ORAL_TABLET | Freq: Every day | ORAL | 1 refills | Status: DC

## 2023-11-23 MED ORDER — POTASSIUM CHLORIDE CRYS ER 20 MEQ PO TBCR: 20.0000 meq | EXTENDED_RELEASE_TABLET | Freq: Two times a day (BID) | ORAL | 0 refills | Status: AC

## 2023-11-23 MED ORDER — CYANOCOBALAMIN 1000 MCG/ML IJ SOLN: INTRAMUSCULAR | 11 refills | Status: DC

## 2023-11-23 MED ORDER — BD SAFETYGLIDE SYRINGE/NEEDLE 25G X 1" 3 ML MISC: 3 refills | Status: AC

## 2023-11-23 NOTE — Telephone Encounter (Signed)
 Copied from CRM [id number]. Topic: Clinical - Medical Advice >> Nov 19, 2023  9:50 AM Almira Coaster wrote: Reason for CRM: Patient is calling for a status update on a Mychart message that was sent on 11/16/2023, she wanted an update on the labs she had done on 11/09/2023 and to know if Dr.Hernandez was going to place her back on the levothyroxine (SYNTHROID) . >> Nov 19, 2023  4:30 PM Corin V wrote: Patient called back and is requesting that she receive a call back as soon as possibel to review her lab results. She also asked that the medications for her thyroid and cholesterol be sent in to  [facility] [address] [place], [place] - [address]  As soon as possible

## 2023-11-23 NOTE — Telephone Encounter (Signed)
 Left message on machine for patient to return our call

## 2023-11-23 NOTE — Telephone Encounter (Signed)
 Copied from CRM [id number]. Topic: Clinical - Medication Question >> Nov 20, 2023  5:06 PM Alcus Dad wrote: Reason for CRM: Patient needs something for Cholesterol

## 2023-11-24 NOTE — Telephone Encounter (Signed)
 Responded via Mychart

## 2023-11-25 ENCOUNTER — Ambulatory Visit
Admission: RE | Admit: 2023-11-25 | Discharge: 2023-11-25 | Disposition: A | Discharge status: Home / Self Care | Payer: Medicaid Other | Source: Ambulatory Visit | Attending: Internal Medicine | Admitting: Internal Medicine

## 2023-11-25 DIAGNOSIS — Z1231 Encounter for screening mammogram for malignant neoplasm of breast: Secondary | ICD-10-CM

## 2023-12-23 ENCOUNTER — Encounter: Payer: Self-pay | Admitting: Internal Medicine

## 2024-01-05 ENCOUNTER — Other Ambulatory Visit (INDEPENDENT_AMBULATORY_CARE_PROVIDER_SITE_OTHER): Payer: Medicaid Other

## 2024-01-05 ENCOUNTER — Other Ambulatory Visit: Payer: Self-pay | Admitting: Internal Medicine

## 2024-01-05 ENCOUNTER — Encounter: Payer: Self-pay | Admitting: Internal Medicine

## 2024-01-05 DIAGNOSIS — E876 Hypokalemia: Secondary | ICD-10-CM | POA: Diagnosis not present

## 2024-01-05 DIAGNOSIS — E782 Mixed hyperlipidemia: Secondary | ICD-10-CM | POA: Diagnosis not present

## 2024-01-05 DIAGNOSIS — E039 Hypothyroidism, unspecified: Secondary | ICD-10-CM | POA: Diagnosis not present

## 2024-01-05 LAB — BASIC METABOLIC PANEL WITH GFR
BUN: 14 mg/dL (ref 6–23)
CO2: 24 meq/L (ref 19–32)
Calcium: 8.9 mg/dL (ref 8.4–10.5)
Chloride: 104 meq/L (ref 96–112)
Creatinine, Ser: 0.89 mg/dL (ref 0.40–1.20)
GFR: 74.55 mL/min (ref >60.00)
Glucose, Bld: 100 mg/dL — ABNORMAL HIGH (ref 70–99)
Potassium: 4.1 meq/L (ref 3.5–5.1)
Sodium: 139 meq/L (ref 135–145)

## 2024-01-05 LAB — LIPID PANEL
Cholesterol: 201 mg/dL — ABNORMAL HIGH (ref 0–200)
HDL: 49 mg/dL (ref >39.00)
LDL Cholesterol: 94 mg/dL (ref 0–99)
NonHDL: 152.36
Total CHOL/HDL Ratio: 4
Triglycerides: 290 mg/dL — ABNORMAL HIGH (ref 0.0–149.0)
VLDL: 58 mg/dL — ABNORMAL HIGH (ref 0.0–40.0)

## 2024-01-05 LAB — TSH: TSH: 12.07 u[IU]/mL — ABNORMAL HIGH (ref 0.35–5.50)

## 2024-01-05 MED ORDER — LEVOTHYROXINE SODIUM 100 MCG PO TABS: 100.0000 ug | ORAL_TABLET | Freq: Every day | ORAL | 1 refills | Status: DC

## 2024-01-18 ENCOUNTER — Ambulatory Visit (INDEPENDENT_AMBULATORY_CARE_PROVIDER_SITE_OTHER): Payer: Medicaid Other

## 2024-01-18 DIAGNOSIS — Z23 Encounter for immunization: Secondary | ICD-10-CM | POA: Diagnosis not present

## 2024-03-08 ENCOUNTER — Ambulatory Visit (INDEPENDENT_AMBULATORY_CARE_PROVIDER_SITE_OTHER): Admitting: Family Medicine

## 2024-03-08 ENCOUNTER — Encounter: Payer: Self-pay | Admitting: Family Medicine

## 2024-03-08 ENCOUNTER — Ambulatory Visit: Payer: Self-pay

## 2024-03-08 VITALS — BP 128/80 | HR 88 | Resp 12 | Ht 65.5 in | Wt 290.0 lb

## 2024-03-08 DIAGNOSIS — M545 Low back pain, unspecified: Secondary | ICD-10-CM

## 2024-03-08 DIAGNOSIS — R350 Frequency of micturition: Secondary | ICD-10-CM

## 2024-03-08 DIAGNOSIS — R197 Diarrhea, unspecified: Secondary | ICD-10-CM | POA: Diagnosis not present

## 2024-03-08 DIAGNOSIS — N390 Urinary tract infection, site not specified: Secondary | ICD-10-CM | POA: Diagnosis not present

## 2024-03-08 LAB — POC URINALSYSI DIPSTICK (AUTOMATED)
Bilirubin, UA: NEGATIVE
Blood, UA: POSITIVE
Glucose, UA: NEGATIVE
Ketones, UA: NEGATIVE
Nitrite, UA: POSITIVE
Protein, UA: POSITIVE — AB
Urobilinogen, UA: 0.2 U/dL
pH, UA: 6 (ref 5.0–8.0)

## 2024-03-08 MED ORDER — NITROFURANTOIN MONOHYD MACRO 100 MG PO CAPS: 100.0000 mg | ORAL_CAPSULE | Freq: Two times a day (BID) | ORAL | 0 refills | Status: AC

## 2024-03-08 NOTE — Telephone Encounter (Signed)
 FYI Only or Action Required?: FYI only for provider  Patient was last seen in primary care on 11/09/2023 by Zilphia Hilt, Charyl Coppersmith, MD. Called Nurse Triage reporting Back Pain and Urinary Frequency. Symptoms began several weeks ago. Interventions attempted: OTC medications: tylenol /ibuprofen . Symptoms are: gradually worsening.  Triage Disposition: See Physician Within 24 Hours  Patient/caregiver understands and will follow disposition?: Yes   Copied from CRM [id number]. Topic: Clinical - Red Word Triage >> Mar 08, 2024 10:59 AM Deaijah H wrote: Red Word that prompted transfer to Nurse Triage: Back pain/discomfort in lower abdomen/discomfort urinating (urgency) (possible UTI or yeast infection)- worse Reason for Disposition  Side (flank) or lower back pain present  Answer Assessment - Initial Assessment Questions 1. SYMPTOM: "What's the main symptom you're concerned about?" (e.g., frequency, incontinence)     Low back pain, abdominal pressure, urgency, frequencey 2. ONSET: "When did the  symptoms   start?"     Two weeks ago 3. PAIN: "Is there any pain?" If Yes, ask: "How bad is it?" (Scale: 1-10; mild, moderate, severe)     Mild pain with urination 4. CAUSE: "What do you think is causing the symptoms?"     Possible UTI, history of kidney stones 5. OTHER SYMPTOMS: "Do you have any other symptoms?" (e.g., blood in urine, fever, flank pain, pain with urination)     Low back pain, stinging when urinating  Protocols used: Urinary Symptoms-A-AH

## 2024-03-08 NOTE — Progress Notes (Signed)
 ACUTE VISIT Chief Complaint  Patient presents with   Urinary Frequency    X 2 weeks, was getting better; now having low back pain   HPI: Madison Tapia is a 53 y.o. female with a PMHx significant for OSA, migraine, hypothyroidism, hx of UTIs, Vitamin D  deficiency, B12 deficiency, iron deficiency anemia, HLD, and anxiety/depression, among others, who is here today complaining of UTI symptoms.   Patient complains of increased urinary frequency, some discomfort while urinating but no dysuria or gross hematuria.  Urinary Frequency  This is a new problem. The current episode started 1 to 4 weeks ago. The problem occurs intermittently. There has been no fever. She is Sexually active. There is No history of pyelonephritis. Associated symptoms include a discharge, frequency, hesitancy, nausea and urgency. Pertinent negatives include no chills, flank pain, possible pregnancy, sweats or vomiting. She has tried nothing for the symptoms. Her past medical history is significant for kidney stones.   She mentions that she has had some vaginal pruritus and vaginal discharge. Lower back pain for about two weeks.  Pain is not radiated, denies history of back pain.  Also endorses nausea and diarrhea, which she has had since undergoing cholecystectomy in 2023 but seems to be worse for the last 3-4 days. She is having about 5 stools per day. No blood in her stool. Her boyfriend has been diagnosed with diabetes, so dietary habits have changed, more fiber, which she thinks may be aggravating GI symptoms. Hypothyroidism on levothyroxine  at 100 mcg daily, is being followed by PCP. Lab Results  Component Value Date   TSH 12.07 (H) 01/05/2024   Mentions she has a hx of nephrolithiases and kidney infections.  Denies any recent injuries that could have caused back pain.  Review of Systems  Constitutional:  Negative for chills.  HENT:  Negative for mouth sores and sore throat.   Respiratory:  Negative  for cough and shortness of breath.   Cardiovascular:  Negative for leg swelling.  Gastrointestinal:  Positive for nausea. Negative for vomiting.  Endocrine: Negative for cold intolerance and heat intolerance.  Genitourinary:  Positive for frequency, hesitancy and urgency. Negative for dysuria, flank pain and vaginal bleeding.  Skin:  Negative for rash.  Neurological:  Negative for syncope, weakness and numbness.  See other pertinent positives and negatives in HPI.  Current Outpatient Medications on File Prior to Visit  Medication Sig Dispense Refill   amphetamine -dextroamphetamine  (ADDERALL) 20 MG tablet Take 1 tablet (20 mg total) by mouth daily. 30 tablet 0   amphetamine -dextroamphetamine  (ADDERALL) 20 MG tablet Take 1 tablet (20 mg total) by mouth daily. 30 tablet 0   amphetamine -dextroamphetamine  (ADDERALL) 20 MG tablet Take 1 tablet (20 mg total) by mouth daily. 30 tablet 0   cyanocobalamin  (VITAMIN B12) 1000 MCG/ML injection Inject 1 ml into the muscle once a week for a month.  Then injected 1 ml once a month thereafter 6 mL 11   escitalopram  (LEXAPRO ) 10 MG tablet TAKE 1 TABLET BY MOUTH ONCE DAILY . APPOINTMENT REQUIRED FOR FUTURE REFILLS 15 tablet 0   fluticasone  (FLONASE ) 50 MCG/ACT nasal spray Place 2 sprays into both nostrils daily. 16 g 6   ibuprofen  (ADVIL ) 200 MG tablet Take 200 mg by mouth every 6 (six) hours as needed for headache or moderate pain.     levothyroxine  (SYNTHROID ) 100 MCG tablet Take 1 tablet (100 mcg total) by mouth daily. 90 tablet 1   Multiple Vitamins-Minerals (MULTIVITAL PO) Take by mouth.  SYRINGE-NEEDLE, DISP, 3 ML (BD SAFETYGLIDE SYRINGE/NEEDLE) 25G X 1" 3 ML MISC Use for B12 injections 100 each 3   potassium chloride  SA (KLOR-CON  M) 20 MEQ tablet Take 1 tablet (20 mEq total) by mouth 2 (two) times daily for 5 days. 10 tablet 0   No current facility-administered medications on file prior to visit.   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/2007   Anemia 2020   ANXIETY 06/24/2007   Cancer (HCC)    topical skin melanoma   Chronic kidney disease    kidney stones   COMMON MIGRAINE 06/24/2007   DEPRESSION 06/24/2007   Family history of adverse reaction to anesthesia    family -extreme nausea, hallucinations (mother)   Headache(784.0) 10/08/2007   HX, URINARY INFECTION 06/24/2007   IRRITABLE BOWEL SYNDROME, HX OF 06/24/2007   MALAISE AND FATIGUE 06/24/2007   NEPHROLITHIASIS, HX OF 06/24/2007   Sleep apnea    no cpap   Unspecified hypothyroidism 07/14/2007   Allergies  Allergen Reactions   Sulfa Antibiotics Hives   Sulfonamide Derivatives Hives   Social History   Socioeconomic History   Marital status: Significant Other    Spouse name: Not on file   Number of children: Not on file   Years of education: Not on file   Highest education level: Bachelor's degree (e.g., BA, AB, BS)  Occupational History   Not on file  Tobacco Use   Smoking status: Never   Smokeless tobacco: Never  Vaping Use   Vaping status: Never Used  Substance and Sexual Activity   Alcohol use: Yes    Comment: occ   Drug use: No   Sexual activity: Not Currently    Partners: Male    Comment: 1st intercourse- 17, partners- greater than 5, boyfriend- 15 yrs  Other Topics Concern   Not on file  Social History Narrative   Not on file   Social Drivers of Health   Financial Resource Strain: Low Risk  (11/06/2023)   Overall Financial Resource Strain (CARDIA)    Difficulty of Paying Living Expenses: Not very hard  Food Insecurity: No Food Insecurity (11/06/2023)   Hunger Vital Sign    Worried About Running Out of Food in the Last Year: Never true    Ran Out of Food in the Last Year: Never true  Transportation Needs: No Transportation Needs (11/06/2023)   PRAPARE - Administrator, Civil Service (Medical): No    Lack of Transportation (Non-Medical): No  Physical Activity: Insufficiently Active (11/06/2023)   Exercise Vital Sign    Days of  Exercise per Week: 3 days    Minutes of Exercise per Session: 30 min  Stress: No Stress Concern Present (11/06/2023)   [employer] of Occupational Health - Occupational Stress Questionnaire    Feeling of Stress : Only a little  Social Connections: Moderately Integrated (11/06/2023)   Social Connection and Isolation Panel [NHANES]    Frequency of Communication with Friends and Family: More than three times a week    Frequency of Social Gatherings with Friends and Family: More than three times a week    Attends Religious Services: 1 to 4 times per year    Active Member of [employer] or Organizations: No    Attends Banker Meetings: Not on file    Marital Status: Living with partner   Vitals:   03/08/24 1549  BP: 128/80  Pulse: 88  Resp: 12  SpO2: 98%   Body mass  index is 47.52 kg/m.  Physical Exam Vitals and nursing note reviewed.  Constitutional:      General: She is not in acute distress.    Appearance: She is well-developed.  HENT:     Head: Normocephalic and atraumatic.  Eyes:     Conjunctiva/sclera: Conjunctivae normal.  Cardiovascular:     Rate and Rhythm: Normal rate and regular rhythm.     Heart sounds: No murmur heard. Pulmonary:     Effort: Pulmonary effort is normal. No respiratory distress.     Breath sounds: Normal breath sounds.  Abdominal:     Palpations: Abdomen is soft. There is no mass.     Tenderness: There is no abdominal tenderness. There is no right CVA tenderness or left CVA tenderness.  Musculoskeletal:     Lumbar back: Tenderness present. No bony tenderness. Negative right straight leg raise test and negative left straight leg raise test.       Back:     Right lower leg: No edema.     Left lower leg: No edema.  Skin:    General: Skin is warm.     Findings: No erythema.  Neurological:     General: No focal deficit present.     Mental Status: She is alert and oriented to person, place, and time.     Gait: Gait normal.  Psychiatric:         Mood and Affect: Mood and affect normal.   ASSESSMENT AND PLAN:  Ms. Lubrano was seen today for UTI symptoms.   Acute bilateral low back pain without sciatica I am not sure if this is a problem related to urinary symptoms, it seems more musculoskeletal. Monitor for new symptoms. I do not think imaging is needed at this time.  Urinary frequency We discussed possible etiologies. Urine dipstick done today positive for blood, protein, nitrite, and 1+ leukocytes. Will send urine for culture.  -     POCT Urinalysis Dipstick (Automated) -     Urine Culture; Future  Diarrhea, unspecified type Chronic, associated with nausea.  Recent dietary changes could be exacerbated problem. S/p cholecystectomy in 2023 and history of IBS. Colonoscopy in 11/2020. Continue adequate hydration. Follow-up with PCP if needed.  Urinary tract infection without hematuria, site unspecified Will treat empirically with Macrobid 100 mg twice daily for 5 days. Continue adequate hydration. Will tailor treatment according to urine culture and sensitivity results. Clearly instructed about warning signs.  -     Nitrofurantoin Monohyd Macro; Take 1 capsule (100 mg total) by mouth 2 (two) times daily for 5 days.  Dispense: 10 capsule; Refill: 0  Return if symptoms worsen or fail to improve.  I, Fritz Jewel Wierda, acting as a scribe for Gisselle Galvis [place], MD., have documented all relevant documentation on the behalf of Davan Nawabi [place], MD, as directed by  Deantre Bourdon [place], MD while in the presence of Genesi Stefanko [place], MD.   I, Gaylin Osoria [place], MD, have reviewed all documentation for this visit. The documentation on 03/08/24 for the exam, diagnosis, procedures, and orders are all accurate and complete.  Maxxwell Edgett G. [place], MD  [facility]. [place] office.

## 2024-03-08 NOTE — Patient Instructions (Addendum)
 A few things to remember from today's visit:  Acute bilateral low back pain without sciatica  Urinary frequency - Plan: POCT Urinalysis Dipstick (Automated), Culture, Urine  Diarrhea, unspecified type  Urinary tract infection without hematuria, site unspecified - Plan: nitrofurantoin, macrocrystal-monohydrate, (MACROBID) 100 MG capsule Nitrofurantoin recommended for urine tract infection. We will follow culture and tailor treatment if needed according to urine culture results.  Adequate fluid intake, avoid holding urine for long hours, and over the counter Vit C OR cranberry capsules might help.  Seek immediate medical attention if severe abdominal pain, vomiting, fever/chills, or worsening symptoms. F/U if symptomatic are not any better after 2-3 days of antibiotic treatment.  If you need refills for medications you take chronically, please call your pharmacy. Do not use My Chart to request refills or for acute issues that need immediate attention. If you send a my chart message, it may take a few days to be addressed, specially if I am not in the office.  Please be sure medication list is accurate. If a new problem present, please set up appointment sooner than planned today.

## 2024-03-09 LAB — URINE CULTURE
MICRO NUMBER:: 16561665
SPECIMEN QUALITY:: ADEQUATE

## 2024-03-11 ENCOUNTER — Other Ambulatory Visit: Payer: Self-pay

## 2024-03-11 ENCOUNTER — Ambulatory Visit: Payer: Self-pay | Admitting: Family Medicine

## 2024-03-11 MED ORDER — FOSFOMYCIN TROMETHAMINE 3 G PO PACK
3.0000 g | PACK | Freq: Once | ORAL | 0 refills | Status: AC
Start: 2024-03-11 — End: 2024-03-11

## 2024-05-17 ENCOUNTER — Ambulatory Visit (INDEPENDENT_AMBULATORY_CARE_PROVIDER_SITE_OTHER): Payer: Medicaid Other | Admitting: Internal Medicine

## 2024-05-17 ENCOUNTER — Encounter: Payer: Self-pay | Admitting: Internal Medicine

## 2024-05-17 VITALS — BP 110/80 | HR 73 | Temp 98.6°F | Wt 286.9 lb

## 2024-05-17 DIAGNOSIS — F900 Attention-deficit hyperactivity disorder, predominantly inattentive type: Secondary | ICD-10-CM | POA: Diagnosis not present

## 2024-05-17 DIAGNOSIS — E039 Hypothyroidism, unspecified: Secondary | ICD-10-CM

## 2024-05-17 DIAGNOSIS — E538 Deficiency of other specified B group vitamins: Secondary | ICD-10-CM

## 2024-05-17 DIAGNOSIS — E782 Mixed hyperlipidemia: Secondary | ICD-10-CM

## 2024-05-17 LAB — COMPREHENSIVE METABOLIC PANEL WITH GFR
ALT: 13 U/L (ref 0–35)
AST: 15 U/L (ref 0–37)
Albumin: 3.8 g/dL (ref 3.5–5.2)
Alkaline Phosphatase: 59 U/L (ref 39–117)
BUN: 18 mg/dL (ref 6–23)
CO2: 22 meq/L (ref 19–32)
Calcium: 8.7 mg/dL (ref 8.4–10.5)
Chloride: 106 meq/L (ref 96–112)
Creatinine, Ser: 0.92 mg/dL (ref 0.40–1.20)
GFR: 71.46 mL/min (ref >60.00)
Glucose, Bld: 101 mg/dL — ABNORMAL HIGH (ref 70–99)
Potassium: 3.9 meq/L (ref 3.5–5.1)
Sodium: 138 meq/L (ref 135–145)
Total Bilirubin: 0.6 mg/dL (ref 0.2–1.2)
Total Protein: 6.8 g/dL (ref 6.0–8.3)

## 2024-05-17 LAB — LIPID PANEL
Cholesterol: 219 mg/dL — ABNORMAL HIGH (ref 0–200)
HDL: 50.5 mg/dL (ref >39.00)
LDL Cholesterol: 100 mg/dL — ABNORMAL HIGH (ref 0–99)
NonHDL: 168.64
Total CHOL/HDL Ratio: 4
Triglycerides: 341 mg/dL — ABNORMAL HIGH (ref 0.0–149.0)
VLDL: 68.2 mg/dL — ABNORMAL HIGH (ref 0.0–40.0)

## 2024-05-17 LAB — VITAMIN B12: Vitamin B-12: 145 pg/mL — ABNORMAL LOW (ref 211–911)

## 2024-05-17 LAB — TSH: TSH: 2.27 u[IU]/mL (ref 0.35–5.50)

## 2024-05-17 MED ORDER — AMPHETAMINE-DEXTROAMPHETAMINE 20 MG PO TABS: 20.0000 mg | ORAL_TABLET | Freq: Every day | ORAL | 0 refills | Status: DC

## 2024-05-17 MED ORDER — AMPHETAMINE-DEXTROAMPHETAMINE 20 MG PO TABS: 20.0000 mg | ORAL_TABLET | Freq: Every day | ORAL | 0 refills | Status: AC

## 2024-05-17 NOTE — Progress Notes (Signed)
 Established Patient Office Visit     CC/Reason for Visit: Follow-up chronic medical conditions  HPI: Madison Tapia is a 53 y.o. female who is coming in today for the above mentioned reasons. Past Medical History is significant for: ADHD, depression, anxiety, morbid obesity, hypothyroidism, vitamin D  and B12 deficiencies.  Feeling well.  She has been taking 188 mcg of Synthroid  instead of 100 mcg.  She is requesting to go back on her previous dose of Adderall.   Past Medical/Surgical History: 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/14/2007   Anemia 2020   ANXIETY 06/24/2007   Cancer (HCC)    topical skin melanoma   Chronic kidney disease    kidney stones   COMMON MIGRAINE 06/24/2007   DEPRESSION 06/24/2007   Family history of adverse reaction to anesthesia    family -extreme nausea, hallucinations (mother)   Headache(784.0) 10/08/2007   HX, URINARY INFECTION 06/24/2007   IRRITABLE BOWEL SYNDROME, HX OF 06/24/2007   MALAISE AND FATIGUE 06/24/2007   NEPHROLITHIASIS, HX OF 06/24/2007   Sleep apnea    no cpap   Unspecified hypothyroidism 07/14/2007    Past Surgical History:  Procedure Laterality Date   CHOLECYSTECTOMY N/A 05/26/2022   Procedure: LAPAROSCOPIC CHOLECYSTECTOMY;  Surgeon: Paola Dreama SAILOR, MD;  Location: [facility] OR;  Service: General;  Laterality: N/A;   KIDNEY STONE SURGERY     basket extraction-age 79   melanoma surgery  2018   from back   ROBOTIC ASSISTED LAPAROSCOPIC HYSTERECTOMY AND SALPINGECTOMY Bilateral 05/24/2019   Procedure: ATTEMPTED XI ROBOTIC ASSISTED TOTAL LAPAROSCOPIC HYSTERECTOMY AND SALPINGECTOMY, CONVERTED TO OPEN  PROEDURE: TOTAL  ABDOMINAL HYSTERECTOMY WITH BILATERAL SALPINGECTOMY;  Surgeon: Lavoie, Marie-Lyne, MD;  Location: [facility];  Service: Gynecology;  Laterality: Bilateral;  request 7:30am OR time in [place] [facility] block requests two hours Will contact [place] re: RNFA t    Social History:  reports that she  has never smoked. She has never used smokeless tobacco. She reports current alcohol use. She reports that she does not use drugs.  Allergies: Allergies  Allergen Reactions   Sulfa Antibiotics Hives   Sulfonamide Derivatives Hives    Family History:  Family History  Problem Relation Age of Onset   Thyroid  disease Mother    Breast cancer Mother 49   Diabetes Father    Hypertension Father    Mental illness Father    Depression Neg Hx        family hx   Colon cancer Neg Hx    Esophageal cancer Neg Hx    Rectal cancer Neg Hx    Stomach cancer Neg Hx      Current Outpatient Medications:    cyanocobalamin  (VITAMIN B12) 1000 MCG/ML injection, Inject 1 ml into the muscle once a week for a month.  Then injected 1 ml once a month thereafter, Disp: 6 mL, Rfl: 11   escitalopram  (LEXAPRO ) 10 MG tablet, TAKE 1 TABLET BY MOUTH ONCE DAILY . APPOINTMENT REQUIRED FOR FUTURE REFILLS, Disp: 15 tablet, Rfl: 0   fluticasone  (FLONASE ) 50 MCG/ACT nasal spray, Place 2 sprays into both nostrils daily., Disp: 16 g, Rfl: 6   ibuprofen  (ADVIL ) 200 MG tablet, Take 200 mg by mouth every 6 (six) hours as needed for headache or moderate pain., Disp: , Rfl:    levothyroxine  (SYNTHROID ) 100 MCG tablet, Take 1 tablet (100 mcg total) by mouth daily., Disp: 90 tablet, Rfl: 1   Multiple Vitamins-Minerals (MULTIVITAL PO), Take by mouth.,  Disp: , Rfl:    SYRINGE-NEEDLE, DISP, 3 ML (BD SAFETYGLIDE SYRINGE/NEEDLE) 25G X 1 3 ML MISC, Use for B12 injections, Disp: 100 each, Rfl: 3   amphetamine -dextroamphetamine  (ADDERALL) 20 MG tablet, Take 1 tablet (20 mg total) by mouth daily., Disp: 30 tablet, Rfl: 0   amphetamine -dextroamphetamine  (ADDERALL) 20 MG tablet, Take 1 tablet (20 mg total) by mouth daily., Disp: 30 tablet, Rfl: 0   amphetamine -dextroamphetamine  (ADDERALL) 20 MG tablet, Take 1 tablet (20 mg total) by mouth daily., Disp: 30 tablet, Rfl: 0   potassium chloride  SA (KLOR-CON  M) 20 MEQ tablet, Take 1 tablet (20 mEq  total) by mouth 2 (two) times daily for 5 days., Disp: 10 tablet, Rfl: 0  Review of Systems:  Negative unless indicated in HPI.   Physical Exam: Vitals:   05/17/24 0815  BP: 110/80  Pulse: 73  Temp: 98.6 F (37 C)  TempSrc: Oral  SpO2: 100%  Weight: 286 lb 14.4 oz (130.1 kg)    Body mass index is 47.02 kg/m.   Physical Exam   Impression and Plan:  Acquired hypothyroidism -     TSH; Future  Attention deficit hyperactivity disorder (ADHD), predominantly inattentive type -     Amphetamine -Dextroamphetamine ; Take 1 tablet (20 mg total) by mouth daily.  Dispense: 30 tablet; Refill: 0 -     Amphetamine -Dextroamphetamine ; Take 1 tablet (20 mg total) by mouth daily.  Dispense: 30 tablet; Refill: 0 -     Amphetamine -Dextroamphetamine ; Take 1 tablet (20 mg total) by mouth daily.  Dispense: 30 tablet; Refill: 0  Vitamin B12 deficiency -     Vitamin B12; Future  Mixed hyperlipidemia -     Lipid panel; Future -     Comprehensive metabolic panel with GFR; Future  Morbid obesity (HCC)  -Check TSH, adjust levothyroxine  dose as needed. - Resume Adderall 20 mg daily, 37-month prescription provided. - Recheck lipids and vitamin B12. -Discussed healthy lifestyle, including increased physical activity and better food choices to promote weight loss.    Time spent:31 minutes reviewing chart, interviewing and examining patient and formulating plan of care.     Tully Theophilus Andrews, MD [facility]

## 2024-05-24 ENCOUNTER — Ambulatory Visit: Payer: Self-pay | Admitting: Internal Medicine

## 2024-05-24 DIAGNOSIS — E039 Hypothyroidism, unspecified: Secondary | ICD-10-CM

## 2024-05-24 DIAGNOSIS — E538 Deficiency of other specified B group vitamins: Secondary | ICD-10-CM

## 2024-05-24 DIAGNOSIS — E782 Mixed hyperlipidemia: Secondary | ICD-10-CM

## 2024-05-27 MED ORDER — LEVOTHYROXINE SODIUM 100 MCG PO TABS: 100.0000 ug | ORAL_TABLET | Freq: Every day | ORAL | 1 refills | Status: DC

## 2024-05-27 MED ORDER — CYANOCOBALAMIN 1000 MCG/ML IJ SOLN: INTRAMUSCULAR | 11 refills | Status: AC

## 2024-05-27 NOTE — Telephone Encounter (Signed)
 Advised patient of lab results. Patient voiced understanding. Patient states she need a refill of the Levothyroxine . Will send in refill for Levothyroxine  and B12. Patient states she would like to continue giving the B12 injections at home.

## 2024-06-01 ENCOUNTER — Other Ambulatory Visit: Payer: Self-pay | Admitting: *Deleted

## 2024-06-01 DIAGNOSIS — E039 Hypothyroidism, unspecified: Secondary | ICD-10-CM

## 2024-06-01 MED ORDER — LEVOTHYROXINE SODIUM 100 MCG PO TABS: 100.0000 ug | ORAL_TABLET | Freq: Every day | ORAL | 1 refills | Status: AC

## 2024-06-01 MED ORDER — LEVOTHYROXINE SODIUM 88 MCG PO TABS: 88.0000 ug | ORAL_TABLET | Freq: Every day | ORAL | 1 refills | Status: AC

## 2024-06-14 ENCOUNTER — Other Ambulatory Visit (HOSPITAL_BASED_OUTPATIENT_CLINIC_OR_DEPARTMENT_OTHER): Payer: Self-pay

## 2024-10-27 ENCOUNTER — Other Ambulatory Visit: Payer: Self-pay

## 2024-10-27 DIAGNOSIS — F900 Attention-deficit hyperactivity disorder, predominantly inattentive type: Secondary | ICD-10-CM

## 2024-10-27 MED ORDER — AMPHETAMINE-DEXTROAMPHETAMINE 20 MG PO TABS: 20.0000 mg | ORAL_TABLET | Freq: Every day | ORAL | 0 refills | Status: AC

## 2024-10-27 NOTE — Telephone Encounter (Signed)
 Copied from CRM #[id number]. Topic: Clinical - Medication Refill >> Oct 27, 2024  8:43 AM Rea ORN wrote: Medication:  amphetamine -dextroamphetamine  (ADDERALL) 20 MG tablet  Has the patient contacted their pharmacy? No (Agent: If no, request that the patient contact the pharmacy for the refill. If patient does not wish to contact the pharmacy document the reason why and proceed with request.) (Agent: If yes, when and what did the pharmacy advise?)  This is the patient's preferred pharmacy:  [facility] [address], [place] - [address] [place] [address] Phone: [phone] Fax: [phone]   Is this the correct pharmacy for this prescription? Yes If no, delete pharmacy and type the correct one.   Has the prescription been filled recently? No  Is the patient out of the medication? No, 2 tab left  Has the patient been seen for an appointment in the last year OR does the patient have an upcoming appointment? Yes  Can we respond through MyChart? No  Agent: Please be advised that Rx refills may take up to 3 business days. We ask that you follow-up with your pharmacy.

## 2024-10-27 NOTE — Telephone Encounter (Signed)
 Refill sent

## 2024-11-09 ENCOUNTER — Encounter: Admitting: Internal Medicine
